# Patient Record
Sex: Female | Born: 1945 | Race: White | Hispanic: No | Smoking: Former smoker
Health system: Southern US, Community
[De-identification: ages and names within clinical notes are randomized; demographics above are authoritative.]

## PROBLEM LIST (undated history)

## (undated) DIAGNOSIS — E119 Type 2 diabetes mellitus without complications: Secondary | ICD-10-CM

## (undated) DIAGNOSIS — K219 Gastro-esophageal reflux disease without esophagitis: Secondary | ICD-10-CM

## (undated) DIAGNOSIS — I219 Acute myocardial infarction, unspecified: Secondary | ICD-10-CM

## (undated) DIAGNOSIS — I10 Essential (primary) hypertension: Secondary | ICD-10-CM

## (undated) DIAGNOSIS — E7800 Pure hypercholesterolemia, unspecified: Secondary | ICD-10-CM

## (undated) DIAGNOSIS — I2510 Atherosclerotic heart disease of native coronary artery without angina pectoris: Secondary | ICD-10-CM

## (undated) DIAGNOSIS — L570 Actinic keratosis: Secondary | ICD-10-CM

## (undated) DIAGNOSIS — C4492 Squamous cell carcinoma of skin, unspecified: Secondary | ICD-10-CM

## (undated) HISTORY — PX: ABDOMINAL HYSTERECTOMY: SHX81

## (undated) HISTORY — DX: Actinic keratosis: L57.0

## (undated) HISTORY — DX: Squamous cell carcinoma of skin, unspecified: C44.92

## (undated) HISTORY — PX: CHOLECYSTECTOMY: SHX55

## (undated) HISTORY — PX: CORONARY ANGIOPLASTY WITH STENT PLACEMENT: SHX49

---

## 2004-04-23 ENCOUNTER — Ambulatory Visit: Payer: Self-pay | Admitting: Gastroenterology

## 2006-03-29 ENCOUNTER — Ambulatory Visit: Payer: Self-pay | Admitting: Family Medicine

## 2006-05-23 ENCOUNTER — Ambulatory Visit: Payer: Self-pay

## 2006-12-30 ENCOUNTER — Other Ambulatory Visit: Payer: Self-pay

## 2006-12-30 ENCOUNTER — Emergency Department: Payer: Self-pay | Admitting: Emergency Medicine

## 2007-04-12 ENCOUNTER — Ambulatory Visit: Payer: Self-pay

## 2008-05-27 ENCOUNTER — Ambulatory Visit: Payer: Self-pay | Admitting: Unknown Physician Specialty

## 2008-06-14 ENCOUNTER — Ambulatory Visit: Payer: Self-pay | Admitting: Unknown Physician Specialty

## 2009-12-28 DIAGNOSIS — E785 Hyperlipidemia, unspecified: Secondary | ICD-10-CM | POA: Insufficient documentation

## 2012-06-02 DIAGNOSIS — I2510 Atherosclerotic heart disease of native coronary artery without angina pectoris: Secondary | ICD-10-CM | POA: Insufficient documentation

## 2014-05-22 DIAGNOSIS — R918 Other nonspecific abnormal finding of lung field: Secondary | ICD-10-CM | POA: Insufficient documentation

## 2014-05-22 DIAGNOSIS — K219 Gastro-esophageal reflux disease without esophagitis: Secondary | ICD-10-CM | POA: Insufficient documentation

## 2015-12-16 DIAGNOSIS — W19XXXA Unspecified fall, initial encounter: Secondary | ICD-10-CM | POA: Insufficient documentation

## 2016-05-31 ENCOUNTER — Encounter: Payer: Self-pay | Admitting: *Deleted

## 2016-05-31 ENCOUNTER — Emergency Department: Payer: Medicare HMO

## 2016-05-31 DIAGNOSIS — L03116 Cellulitis of left lower limb: Secondary | ICD-10-CM | POA: Insufficient documentation

## 2016-05-31 DIAGNOSIS — Y939 Activity, unspecified: Secondary | ICD-10-CM | POA: Insufficient documentation

## 2016-05-31 DIAGNOSIS — X501XXA Overexertion from prolonged static or awkward postures, initial encounter: Secondary | ICD-10-CM | POA: Insufficient documentation

## 2016-05-31 DIAGNOSIS — S8252XA Displaced fracture of medial malleolus of left tibia, initial encounter for closed fracture: Secondary | ICD-10-CM | POA: Diagnosis not present

## 2016-05-31 DIAGNOSIS — Y929 Unspecified place or not applicable: Secondary | ICD-10-CM | POA: Insufficient documentation

## 2016-05-31 DIAGNOSIS — Y999 Unspecified external cause status: Secondary | ICD-10-CM | POA: Insufficient documentation

## 2016-05-31 DIAGNOSIS — E119 Type 2 diabetes mellitus without complications: Secondary | ICD-10-CM | POA: Diagnosis not present

## 2016-05-31 DIAGNOSIS — S99912A Unspecified injury of left ankle, initial encounter: Secondary | ICD-10-CM | POA: Diagnosis present

## 2016-05-31 DIAGNOSIS — I10 Essential (primary) hypertension: Secondary | ICD-10-CM | POA: Diagnosis not present

## 2016-05-31 NOTE — ED Triage Notes (Signed)
Pt states last week she dropped a wood board on her lower left leg and then Saturday she missed a step and twisted her left leg. She has redness, swelling and bruising to the left lower extremity. Denies fevers. No meds PTA.

## 2016-06-01 ENCOUNTER — Emergency Department
Admission: EM | Admit: 2016-06-01 | Discharge: 2016-06-01 | Disposition: A | Discharge status: Home / Self Care | Payer: Medicare HMO | Attending: Emergency Medicine | Admitting: Emergency Medicine

## 2016-06-01 DIAGNOSIS — L03116 Cellulitis of left lower limb: Secondary | ICD-10-CM

## 2016-06-01 DIAGNOSIS — S82892A Other fracture of left lower leg, initial encounter for closed fracture: Secondary | ICD-10-CM

## 2016-06-01 HISTORY — DX: Gastro-esophageal reflux disease without esophagitis: K21.9

## 2016-06-01 HISTORY — DX: Type 2 diabetes mellitus without complications: E11.9

## 2016-06-01 HISTORY — DX: Pure hypercholesterolemia, unspecified: E78.00

## 2016-06-01 HISTORY — DX: Acute myocardial infarction, unspecified: I21.9

## 2016-06-01 HISTORY — DX: Essential (primary) hypertension: I10

## 2016-06-01 MED ORDER — OXYCODONE-ACETAMINOPHEN 5-325 MG PO TABS
1.0000 | ORAL_TABLET | Freq: Once | ORAL | Status: AC
  Administered 2016-06-01: 1 via ORAL

## 2016-06-01 MED ORDER — OXYCODONE-ACETAMINOPHEN 5-325 MG PO TABS
ORAL_TABLET | ORAL | Status: AC
  Administered 2016-06-01: 1 via ORAL
  Filled 2016-06-01: qty 1

## 2016-06-01 MED ORDER — CEPHALEXIN 500 MG PO CAPS
500.0000 mg | ORAL_CAPSULE | Freq: Once | ORAL | Status: AC
  Administered 2016-06-01: 500 mg via ORAL

## 2016-06-01 MED ORDER — OXYCODONE-ACETAMINOPHEN 5-325 MG PO TABS: 1.0000 | ORAL_TABLET | ORAL | 0 refills | Status: DC | PRN

## 2016-06-01 MED ORDER — CEPHALEXIN 500 MG PO CAPS: 500.0000 mg | ORAL_CAPSULE | Freq: Two times a day (BID) | ORAL | 0 refills | Status: AC

## 2016-06-01 MED ORDER — CEPHALEXIN 500 MG PO CAPS
ORAL_CAPSULE | ORAL | Status: AC
  Administered 2016-06-01: 500 mg via ORAL
  Filled 2016-06-01: qty 1

## 2016-06-01 NOTE — ED Provider Notes (Signed)
[facility] Emergency Department Provider Note   First MD Initiated Contact with Patient 06/01/16 [phone]     (approximate)  I have reviewed the triage vital signs and the nursing notes.   HISTORY  Chief Complaint Ankle Pain   HPI Kerri Mccullough is a 71 y.o. female belowof chronic medical conditions presents to the emergency department with a history of accidentally hitting the anterior portion of her lower leg 4 days ago and then subsequently twisting her left ankle shortly thereafter. Patient admits to 8 out of 10 left ankle pain and swelling. Patient also admits to redness noted to the distal portion of the left leg that is worsened  since onset tonight   Past Medical History:  Diagnosis Date  . Diabetes mellitus without complication ([place])   . GERD (gastroesophageal reflux disease)   . Heart attack ([place])   . High cholesterol   . Hypertension     There are no active problems to display for this patient.   Past Surgical History:  Procedure Laterality Date  . ABDOMINAL HYSTERECTOMY    . CORONARY ANGIOPLASTY WITH STENT PLACEMENT      Prior to Admission medications   Not on File    Allergies No known drug allergies  Family history Noncontributory Social History Social History  Substance Use Topics  . Smoking status: Never Smoker  . Smokeless tobacco: Never Used  . Alcohol use No    Review of Systems Constitutional: No fever/chills Eyes: No visual changes. ENT: No sore throat. Cardiovascular: Denies chest pain. Respiratory: Denies shortness of breath. Gastrointestinal: No abdominal pain.  No nausea, no vomiting.  No diarrhea.  No constipation. Genitourinary: Negative for dysuria. Musculoskeletal: Negative for back pain. Skin: Negative for rash. Positive for abrasion and redness to the left lower extremity. Positive for left leg swelling Neurological: Negative for headaches, focal weakness or numbness.  10-point ROS otherwise  negative.  ____________________________________________   PHYSICAL EXAM:  VITAL SIGNS: ED Triage Vitals [05/31/16 2003]  Enc Vitals Group     BP (!) 147/67     Pulse Rate 62     Resp 18     Temp 98.2 F (36.8 C)     Temp src      SpO2 96 %     Weight      Height      Head Circumference      Peak Flow      Pain Score 8     Pain Loc      Pain Edu?      Excl. in [place]?     Constitutional: Alert and oriented. Well appearing and in no acute distress. Eyes: Conjunctivae are normal. PERRL. EOMI. Head: Atraumatic. Mouth/Throat: Mucous membranes are moist.  Oropharynx non-erythematous. Neck: No stridor.   Cardiovascular: Normal rate, regular rhythm. Good peripheral circulation. Grossly normal heart sounds. Respiratory: Normal respiratory effort.  No retractions. Lungs CTAB. Gastrointestinal: Soft and nontender. No distention.  Musculoskeletal: No lower extremity tenderness nor edema. No gross deformities of extremities. Neurologic:  Normal speech and language. No gross focal neurologic deficits are appreciated.  Skin:  Skin is warm, dry and intact. No rash noted. Psychiatric: Mood and affect are normal. Speech and behavior are normal.   RADIOLOGY I, Christmas, personally viewed and evaluated these images (plain radiographs) as part of my medical decision making, as well as reviewing the written report by the radiologist.  Dg Ankle Complete Left  Result Date: 05/31/2016 CLINICAL DATA:  Dropped a  board on lower left leg, twisting injury EXAM: LEFT ANKLE COMPLETE - 3+ VIEW COMPARISON:  None. FINDINGS: Ankle mortise is grossly symmetric. Moderate diffuse soft tissue swelling. Age indeterminate medial malleolar avulsion fracture. Lateral view demonstrates questionable fracture through the distal fibula posteriorly. IMPRESSION: 1. A age indeterminate avulsion fracture injury of the medial malleolus. 2. Crescent-shaped os ossific opacity silhouetting the posterior cortex of the  distal fibula on lateral view, uncertain if this represents a fracture. Electronically Signed   By: Donavan Foil M.D.   On: 05/31/2016 21:01      Procedures   ____________________________________________   INITIAL IMPRESSION / ASSESSMENT AND PLAN / ED COURSE  Pertinent labs & imaging results that were available during my care of the patient were reviewed by me and considered in my medical decision making (see chart for details).  History physical exam consistent with avulsion fracture as well as cellulitis of the lower extremity. Spoke with the patient at length regarding warning signs that would warn immediate return to the emergency department. Patient given Keflex and Percocet.      ____________________________________________  FINAL CLINICAL IMPRESSION(S) / ED DIAGNOSES  Final diagnoses:  Avulsion fracture of ankle, left, closed, initial encounter  Cellulitis of left leg     MEDICATIONS GIVEN DURING THIS VISIT:  Medications  cephALEXin (KEFLEX) capsule 500 mg (500 mg Oral Given 06/01/16 0111)  oxyCODONE-acetaminophen (PERCOCET/ROXICET) 5-325 MG per tablet 1 tablet (1 tablet Oral Given 06/01/16 0111)     NEW OUTPATIENT MEDICATIONS STARTED DURING THIS VISIT:  New Prescriptions   No medications on file    Modified Medications   No medications on file    Discontinued Medications   No medications on file     Note:  This document was prepared using Dragon voice recognition software and may include unintentional dictation errors.    Gregor Hams, MD 06/01/16 2245

## 2016-06-02 DIAGNOSIS — S8253XA Displaced fracture of medial malleolus of unspecified tibia, initial encounter for closed fracture: Secondary | ICD-10-CM | POA: Insufficient documentation

## 2016-09-13 DIAGNOSIS — S32010S Wedge compression fracture of first lumbar vertebra, sequela: Secondary | ICD-10-CM | POA: Insufficient documentation

## 2016-09-18 DIAGNOSIS — M79671 Pain in right foot: Secondary | ICD-10-CM | POA: Insufficient documentation

## 2016-09-18 DIAGNOSIS — M8000XA Age-related osteoporosis with current pathological fracture, unspecified site, initial encounter for fracture: Secondary | ICD-10-CM | POA: Insufficient documentation

## 2017-02-10 DIAGNOSIS — IMO0001 Reserved for inherently not codable concepts without codable children: Secondary | ICD-10-CM | POA: Insufficient documentation

## 2017-05-25 DIAGNOSIS — K5900 Constipation, unspecified: Secondary | ICD-10-CM | POA: Insufficient documentation

## 2017-05-25 DIAGNOSIS — R32 Unspecified urinary incontinence: Secondary | ICD-10-CM | POA: Insufficient documentation

## 2018-05-05 ENCOUNTER — Other Ambulatory Visit: Payer: Self-pay

## 2018-05-05 ENCOUNTER — Observation Stay
Admission: EM | Admit: 2018-05-05 | Discharge: 2018-05-07 | Disposition: A | Discharge status: Home / Self Care | Payer: Medicare HMO | Attending: Internal Medicine | Admitting: Internal Medicine

## 2018-05-05 ENCOUNTER — Emergency Department: Payer: Medicare HMO

## 2018-05-05 ENCOUNTER — Encounter: Payer: Self-pay | Admitting: Emergency Medicine

## 2018-05-05 DIAGNOSIS — K449 Diaphragmatic hernia without obstruction or gangrene: Secondary | ICD-10-CM | POA: Diagnosis not present

## 2018-05-05 DIAGNOSIS — Z955 Presence of coronary angioplasty implant and graft: Secondary | ICD-10-CM | POA: Insufficient documentation

## 2018-05-05 DIAGNOSIS — M4856XA Collapsed vertebra, not elsewhere classified, lumbar region, initial encounter for fracture: Secondary | ICD-10-CM | POA: Insufficient documentation

## 2018-05-05 DIAGNOSIS — Z886 Allergy status to analgesic agent status: Secondary | ICD-10-CM | POA: Diagnosis not present

## 2018-05-05 DIAGNOSIS — I25118 Atherosclerotic heart disease of native coronary artery with other forms of angina pectoris: Secondary | ICD-10-CM

## 2018-05-05 DIAGNOSIS — R918 Other nonspecific abnormal finding of lung field: Secondary | ICD-10-CM | POA: Insufficient documentation

## 2018-05-05 DIAGNOSIS — I252 Old myocardial infarction: Secondary | ICD-10-CM | POA: Insufficient documentation

## 2018-05-05 DIAGNOSIS — Z888 Allergy status to other drugs, medicaments and biological substances status: Secondary | ICD-10-CM | POA: Diagnosis not present

## 2018-05-05 DIAGNOSIS — E119 Type 2 diabetes mellitus without complications: Secondary | ICD-10-CM | POA: Diagnosis not present

## 2018-05-05 DIAGNOSIS — K219 Gastro-esophageal reflux disease without esophagitis: Secondary | ICD-10-CM | POA: Insufficient documentation

## 2018-05-05 DIAGNOSIS — I10 Essential (primary) hypertension: Secondary | ICD-10-CM | POA: Insufficient documentation

## 2018-05-05 DIAGNOSIS — I700 Atherosclerosis of aorta: Secondary | ICD-10-CM | POA: Diagnosis not present

## 2018-05-05 DIAGNOSIS — E785 Hyperlipidemia, unspecified: Secondary | ICD-10-CM | POA: Insufficient documentation

## 2018-05-05 DIAGNOSIS — Z79899 Other long term (current) drug therapy: Secondary | ICD-10-CM | POA: Insufficient documentation

## 2018-05-05 DIAGNOSIS — Z90710 Acquired absence of both cervix and uterus: Secondary | ICD-10-CM | POA: Insufficient documentation

## 2018-05-05 DIAGNOSIS — Z7982 Long term (current) use of aspirin: Secondary | ICD-10-CM | POA: Insufficient documentation

## 2018-05-05 DIAGNOSIS — R0789 Other chest pain: Secondary | ICD-10-CM | POA: Diagnosis not present

## 2018-05-05 DIAGNOSIS — R079 Chest pain, unspecified: Secondary | ICD-10-CM | POA: Diagnosis present

## 2018-05-05 LAB — CBC
HCT: 38 % (ref 36.0–46.0)
Hemoglobin: 11.6 g/dL — ABNORMAL LOW (ref 12.0–15.0)
MCH: 26.7 pg (ref 26.0–34.0)
MCHC: 30.5 g/dL (ref 30.0–36.0)
MCV: 87.4 fL (ref 80.0–100.0)
PLATELETS: 228 10*3/uL (ref 150–400)
RBC: 4.35 MIL/uL (ref 3.87–5.11)
RDW: 14.3 % (ref 11.5–15.5)
WBC: 6.1 10*3/uL (ref 4.0–10.5)
nRBC: 0 % (ref 0.0–0.2)

## 2018-05-05 LAB — BASIC METABOLIC PANEL
Anion gap: 14 (ref 5–15)
BUN: 14 mg/dL (ref 8–23)
CALCIUM: 9.1 mg/dL (ref 8.9–10.3)
CHLORIDE: 95 mmol/L — AB (ref 98–111)
CO2: 23 mmol/L (ref 22–32)
CREATININE: 0.94 mg/dL (ref 0.44–1.00)
GFR calc non Af Amer: >60 mL/min (ref >60)
GLUCOSE: 293 mg/dL — AB (ref 70–99)
Potassium: 3.4 mmol/L — ABNORMAL LOW (ref 3.5–5.1)
Sodium: 132 mmol/L — ABNORMAL LOW (ref 135–145)

## 2018-05-05 LAB — GLUCOSE, CAPILLARY: GLUCOSE-CAPILLARY: 207 mg/dL — AB (ref 70–99)

## 2018-05-05 LAB — TROPONIN I: Troponin I: <0.03 ng/mL (ref <0.03)

## 2018-05-05 LAB — TSH: TSH: 2.394 u[IU]/mL (ref 0.350–4.500)

## 2018-05-05 MED ORDER — CITALOPRAM HYDROBROMIDE 20 MG PO TABS
20.0000 mg | ORAL_TABLET | Freq: Every day | ORAL | Status: DC
  Administered 2018-05-06 – 2018-05-07 (×2): 20 mg via ORAL
  Filled 2018-05-05 (×2): qty 1

## 2018-05-05 MED ORDER — LIRAGLUTIDE 18 MG/3ML ~~LOC~~ SOPN: 0.6000 mg | PEN_INJECTOR | Freq: Every day | SUBCUTANEOUS | Status: DC

## 2018-05-05 MED ORDER — ASPIRIN 81 MG PO CHEW
324.0000 mg | CHEWABLE_TABLET | Freq: Once | ORAL | Status: AC
  Administered 2018-05-05: 324 mg via ORAL
  Filled 2018-05-05: qty 4

## 2018-05-05 MED ORDER — ONDANSETRON HCL 4 MG PO TABS: 4.0000 mg | ORAL_TABLET | Freq: Four times a day (QID) | ORAL | Status: DC | PRN

## 2018-05-05 MED ORDER — IOHEXOL 350 MG/ML SOLN
75.0000 mL | Freq: Once | INTRAVENOUS | Status: AC | PRN
  Administered 2018-05-05: 75 mL via INTRAVENOUS

## 2018-05-05 MED ORDER — NITROGLYCERIN 0.4 MG SL SUBL
0.4000 mg | SUBLINGUAL_TABLET | Freq: Once | SUBLINGUAL | Status: AC
  Administered 2018-05-05: 0.4 mg via SUBLINGUAL
  Filled 2018-05-05: qty 1

## 2018-05-05 MED ORDER — ONDANSETRON HCL 4 MG/2ML IJ SOLN: 4.0000 mg | Freq: Four times a day (QID) | INTRAMUSCULAR | Status: DC | PRN

## 2018-05-05 MED ORDER — NITROGLYCERIN 0.4 MG SL SUBL: 0.4000 mg | SUBLINGUAL_TABLET | SUBLINGUAL | Status: DC | PRN

## 2018-05-05 MED ORDER — AMLODIPINE BESYLATE 5 MG PO TABS
5.0000 mg | ORAL_TABLET | Freq: Two times a day (BID) | ORAL | Status: DC
  Administered 2018-05-05 – 2018-05-07 (×4): 5 mg via ORAL
  Filled 2018-05-05 (×4): qty 1

## 2018-05-05 MED ORDER — ASPIRIN EC 81 MG PO TBEC
81.0000 mg | DELAYED_RELEASE_TABLET | Freq: Every day | ORAL | Status: DC
  Administered 2018-05-06: 81 mg via ORAL
  Filled 2018-05-05: qty 1

## 2018-05-05 MED ORDER — SODIUM CHLORIDE 0.9% FLUSH: 3.0000 mL | INTRAVENOUS | Status: DC | PRN

## 2018-05-05 MED ORDER — SODIUM CHLORIDE 0.9% FLUSH: 3.0000 mL | Freq: Once | INTRAVENOUS | Status: DC

## 2018-05-05 MED ORDER — PANTOPRAZOLE SODIUM 40 MG PO TBEC
40.0000 mg | DELAYED_RELEASE_TABLET | Freq: Every day | ORAL | Status: DC
  Administered 2018-05-06: 40 mg via ORAL
  Filled 2018-05-05: qty 1

## 2018-05-05 MED ORDER — MIRABEGRON ER 50 MG PO TB24
50.0000 mg | ORAL_TABLET | Freq: Every day | ORAL | Status: DC
  Administered 2018-05-06 – 2018-05-07 (×2): 50 mg via ORAL
  Filled 2018-05-05 (×2): qty 1

## 2018-05-05 MED ORDER — ACETAMINOPHEN 650 MG RE SUPP: 650.0000 mg | Freq: Four times a day (QID) | RECTAL | Status: DC | PRN

## 2018-05-05 MED ORDER — LOSARTAN POTASSIUM 25 MG PO TABS
25.0000 mg | ORAL_TABLET | Freq: Every day | ORAL | Status: DC
  Administered 2018-05-06 – 2018-05-07 (×2): 25 mg via ORAL
  Filled 2018-05-05 (×2): qty 1

## 2018-05-05 MED ORDER — SODIUM CHLORIDE 0.9% FLUSH
3.0000 mL | Freq: Two times a day (BID) | INTRAVENOUS | Status: DC
  Administered 2018-05-05 – 2018-05-06 (×3): 3 mL via INTRAVENOUS

## 2018-05-05 MED ORDER — METFORMIN HCL 500 MG PO TABS: 1000.0000 mg | ORAL_TABLET | Freq: Two times a day (BID) | ORAL | Status: DC

## 2018-05-05 MED ORDER — ACETAMINOPHEN 325 MG PO TABS: 650.0000 mg | ORAL_TABLET | Freq: Four times a day (QID) | ORAL | Status: DC | PRN

## 2018-05-05 MED ORDER — PRAVASTATIN SODIUM 40 MG PO TABS
40.0000 mg | ORAL_TABLET | Freq: Every day | ORAL | Status: DC
  Administered 2018-05-05 – 2018-05-07 (×3): 40 mg via ORAL
  Filled 2018-05-05 (×3): qty 1

## 2018-05-05 MED ORDER — GABAPENTIN 300 MG PO CAPS
300.0000 mg | ORAL_CAPSULE | Freq: Every day | ORAL | Status: DC
  Administered 2018-05-05 – 2018-05-06 (×2): 300 mg via ORAL
  Filled 2018-05-05 (×2): qty 1

## 2018-05-05 MED ORDER — MORPHINE SULFATE (PF) 2 MG/ML IV SOLN: 2.0000 mg | INTRAVENOUS | Status: DC | PRN

## 2018-05-05 MED ORDER — METOPROLOL SUCCINATE ER 25 MG PO TB24
25.0000 mg | ORAL_TABLET | Freq: Every day | ORAL | Status: DC
  Administered 2018-05-06 – 2018-05-07 (×2): 25 mg via ORAL
  Filled 2018-05-05 (×2): qty 1

## 2018-05-05 MED ORDER — ENOXAPARIN SODIUM 40 MG/0.4ML ~~LOC~~ SOLN
40.0000 mg | SUBCUTANEOUS | Status: DC
  Administered 2018-05-05 – 2018-05-06 (×2): 40 mg via SUBCUTANEOUS
  Filled 2018-05-05 (×2): qty 0.4

## 2018-05-05 MED ORDER — CHLORTHALIDONE 25 MG PO TABS
25.0000 mg | ORAL_TABLET | Freq: Every day | ORAL | Status: DC
  Administered 2018-05-06 – 2018-05-07 (×2): 25 mg via ORAL
  Filled 2018-05-05 (×2): qty 1

## 2018-05-05 MED ORDER — SODIUM CHLORIDE 0.9 % IV SOLN: 250.0000 mL | INTRAVENOUS | Status: DC | PRN

## 2018-05-05 NOTE — ED Notes (Addendum)
ED TO INPATIENT HANDOFF REPORT  ED Nurse Name and Phone #: Kerri Mccullough [phone]  S Name/Age/Gender Kerri Mccullough 73 y.o. female Room/Bed: ED09A/ED09A  Code Status   Code Status: Not on file  Home/SNF/Other Home Patient oriented to: self, place, time and situation Is this baseline? Yes   Triage Complete: Triage complete  Chief Complaint chest pain  Triage Note Pt to ED via POV. Pt states that she was out shopping and started to have central chest pain that radiated into both arms and into both sides of her throat. Pt states that the pain lasted about 5 minutes. Pt denies shortness of breath, nausea or vomiting. Pt has hx/o MI. Pt is in NAD.    Allergies Allergies  Allergen Reactions  . Ace Inhibitors Swelling  . Rosiglitazone Other (See Comments)    Other Reaction: SOB, Pulmonary Edema Other Reaction: SOB, Pulmonary Edema Other Reaction: SOB, Pulmonary Edema   . Aspirin Swelling    When taken with a beta blocker   . Golytely [Peg 3350-Electrolytes] Swelling    Level of Care/Admitting Diagnosis ED Disposition    ED Disposition Condition [place]: [place] [[id number]]  Level of Care: Telemetry [5]  Diagnosis: Chest pain [045409]  Admitting Physician: Dustin Flock [[id number]]  Attending Physician: Dustin Flock [[id number]]  PT Class (Do Not Modify): Observation [104]  PT Acc Code (Do Not Modify): Observation [10022]       B Medical/Surgery History Past Medical History:  Diagnosis Date  . Diabetes mellitus without complication ([name])   . GERD (gastroesophageal reflux disease)   . Heart attack ([place])   . High cholesterol   . Hypertension    Past Surgical History:  Procedure Laterality Date  . ABDOMINAL HYSTERECTOMY    . CORONARY ANGIOPLASTY WITH STENT PLACEMENT       A IV Location/Drains/Wounds Patient Lines/Drains/Airways Status   Active Line/Drains/Airways    Name:   Placement date:   Placement time:   Site:    Days:   Peripheral IV 05/05/18 Right Antecubital   05/05/18    1628    Antecubital   less than 1          Intake/Output Last 24 hours No intake or output data in the 24 hours ending 05/05/18 1928  Labs/Imaging Results for orders placed or performed during the hospital encounter of 05/05/18 (from the past 48 hour(s))  Basic metabolic panel     Status: Abnormal   Collection Time: 05/05/18  3:45 PM  Result Value Ref Range   Sodium 132 (L) 135 - 145 mmol/L   Potassium 3.4 (L) 3.5 - 5.1 mmol/L   Chloride 95 (L) 98 - 111 mmol/L   CO2 23 22 - 32 mmol/L   Glucose, Bld 293 (H) 70 - 99 mg/dL   BUN 14 8 - 23 mg/dL   Creatinine, Ser 0.94 0.44 - 1.00 mg/dL   Calcium 9.1 8.9 - 10.3 mg/dL   GFR calc non Af Amer >60 >60 mL/min   GFR calc Af Amer >60 >60 mL/min   Anion gap 14 5 - 15    Comment: Performed at [facility], [address] [place], [place] [address]  CBC     Status: Abnormal   Collection Time: 05/05/18  3:45 PM  Result Value Ref Range   WBC 6.1 4.0 - 10.5 K/uL   RBC 4.35 3.87 - 5.11 MIL/uL   Hemoglobin 11.6 (L) 12.0 - 15.0 g/dL   HCT 38.0  36.0 - 46.0 %   MCV 87.4 80.0 - 100.0 fL   MCH 26.7 26.0 - 34.0 pg   MCHC 30.5 30.0 - 36.0 g/dL   RDW 14.3 11.5 - 15.5 %   Platelets 228 150 - 400 K/uL   nRBC 0.0 0.0 - 0.2 %    Comment: Performed at [facility], [address] [place], [place] [address]  Troponin I - ONCE - STAT     Status: None   Collection Time: 05/05/18  3:45 PM  Result Value Ref Range   Troponin I <0.03 <0.03 ng/mL    Comment: Performed at [facility], [address], [place], [place] [address]   Ct Angio Chest Aorta W And/or Wo Contrast  Result Date: 05/05/2018 CLINICAL DATA:  Central chest pain. EXAM: CT ANGIOGRAPHY CHEST WITH CONTRAST TECHNIQUE: Multidetector CT imaging of the chest was performed using the standard protocol during bolus administration of intravenous contrast. Multiplanar CT image reconstructions and MIPs were  obtained to evaluate the vascular anatomy. CONTRAST:  42mL OMNIPAQUE IOHEXOL 350 MG/ML SOLN COMPARISON:  None. FINDINGS: Cardiovascular: Aortic atherosclerosis. No thoracic aortic aneurysm or evidence of aortic dissection. Heart size is upper normal. No pericardial effusion. Mediastinum/Nodes: No mass or enlarged lymph nodes appreciated within the mediastinum or perihilar regions. Esophagus is unremarkable. Small hiatal hernia. Thickening of the walls of the hernia is likely related to associated chronic reflux. Trachea and central bronchi are unremarkable. Lungs/Pleura: 2 adjacent 7 mm pleural based nodules are noted within the lateral aspects of the LEFT lower lobe (series 7, images 80 and 81). 8 mm nodular density within the RIGHT middle lobe (series 7, image 68). Lungs otherwise clear. No pneumonia or pulmonary edema. No pleural effusion or pneumothorax. Upper Abdomen: Limited images of the upper abdomen are unremarkable for acute process. Musculoskeletal: Chronic compression fracture deformity at the L1 vertebral body, progressed compared to previous lumbar spine CT of 08/31/2016, now over 75% compressed anteriorly/centrally with associated mild retropulsion of the posterior margin of the vertebral body. No acute appearing osseous abnormality. Review of the MIP images confirms the above findings. IMPRESSION: 1. No acute findings. No pneumonia or pulmonary edema. No aortic aneurysm or evidence of aortic dissection. 2. Small hiatal hernia. Thickening of the walls of the hernia is likely related to associated chronic reflux. 3. 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 4. Two adjacent pleural based nodules within the lateral aspects of the left lower lobe and within the right middle lobe, each measuring 8  mm. Non-contrast chest CT at 3-6 months is recommended. If the nodules are stable at time of repeat CT, then future CT at 18-24 months (from today's scan) is considered optional for low-risk patients, but is recommended for high-risk patients. This recommendation follows the consensus statement: Guidelines for Management of Incidental Pulmonary Nodules Detected on CT Images: From the Fleischner Society 2017; Radiology 2017; 284:228-243. Aortic Atherosclerosis (ICD10-I70.0). Electronically Signed   By: Franki Cabot M.D.   On: 05/05/2018 17:07    Pending Labs Unresulted Labs (From admission, onward)    Start     Ordered   05/06/18 0500  Lipid panel  Tomorrow morning,   STAT     05/05/18 1826   Signed and Held  CBC  (enoxaparin (LOVENOX)    CrCl >/= 30 ml/min)  Once,  R    Comments:  Baseline for enoxaparin therapy IF NOT ALREADY DRAWN.  Notify MD if PLT < 100 K.    Signed and Held   Signed and Held  Creatinine, serum  (enoxaparin (LOVENOX)    CrCl >/= 30 ml/min)  Once,   R    Comments:  Baseline for enoxaparin therapy IF NOT ALREADY DRAWN.    Signed and Held   Signed and Held  Creatinine, serum  (enoxaparin (LOVENOX)    CrCl >/= 30 ml/min)  Weekly,   R    Comments:  while on enoxaparin therapy    Signed and Held   Signed and Held  TSH  Once,   R     Signed and Held   Signed and Held  Hemoglobin A1c  Once,   R     Signed and Held   Signed and Held  CBC  Tomorrow morning,   R     Signed and Held   Signed and Held  Basic metabolic panel  Tomorrow morning,   R     Signed and Held          Vitals/Pain Today's Vitals   05/05/18 1540 05/05/18 1544 05/05/18 1600 05/05/18 1630  BP:  (!) 160/56 (!) 146/55 129/72  Pulse:  72 70 82  Resp:  16 20 (!) 22  Temp:  98 F (36.7 C)    TempSrc:  Oral    SpO2:  96% 97% 97%  Weight: 86.2 kg     Height: 5\' 5"  (1.651 m)     PainSc: 0-No pain       Isolation Precautions No active isolations  Medications Medications  sodium chloride flush  (NS) 0.9 % injection 3 mL ( Intravenous Canceled Entry 05/05/18 1552)  nitroGLYCERIN (NITROSTAT) SL tablet 0.4 mg (has no administration in time range)  morphine 2 MG/ML injection 2 mg (has no administration in time range)  nitroGLYCERIN (NITROSTAT) SL tablet 0.4 mg (0.4 mg Sublingual Given 05/05/18 1626)  iohexol (OMNIPAQUE) 350 MG/ML injection 75 mL (75 mLs Intravenous Contrast Given 05/05/18 1640)  aspirin chewable tablet 324 mg (324 mg Oral Given 05/05/18 1708)    Mobility walks Low fall risk   Focused Assessments    R Recommendations: See Admitting Provider Note  Report given to: Aniceto Boss, RN

## 2018-05-05 NOTE — ED Triage Notes (Signed)
Pt to ED via POV. Pt states that she was out shopping and started to have central chest pain that radiated into both arms and into both sides of her throat. Pt states that the pain lasted about 5 minutes. Pt denies shortness of breath, nausea or vomiting. Pt has hx/o MI. Pt is in NAD.

## 2018-05-05 NOTE — ED Provider Notes (Signed)
[facility] Emergency Department Provider Note   ____________________________________________   First MD Initiated Contact with Patient 05/05/18 [phone]     (approximate)  I have reviewed the triage vital signs and the nursing notes.   HISTORY  Chief Complaint Chest Pain    HPI Kerri Mccullough is a 73 y.o. female here for evaluation of chest pain  Patient reports she was walking in a store, she suddenly started to have a very heavy severe pressure in her chest that radiated to her upper back and to her neck.  It went away after she stopped to rest for about 5 minutes.  She did feel some nausea with it.  She does report she had a stress test roughly a week ago at [facility] and was told it was okay but she also reports she was not having any sort of chest pain like this this was by far the worst episode of chest pain that she reports she is ever had since her last heart attack  Take aspirin daily.  Pain is almost resolved now, just a little bit of slight discomfort still in the right side of the neck which is improving  The episode was associated with shortness of breath.  She is compliant with her medications.  Reports having 2 stents follows with [facility]   Past Medical History:  Diagnosis Date  . Diabetes mellitus without complication ([place])   . GERD (gastroesophageal reflux disease)   . Heart attack ([place])   . High cholesterol   . Hypertension     There are no active problems to display for this patient.   Past Surgical History:  Procedure Laterality Date  . ABDOMINAL HYSTERECTOMY    . CORONARY ANGIOPLASTY WITH STENT PLACEMENT      Prior to Admission medications   Medication Sig Start Date End Date Taking? Authorizing Provider  oxyCODONE-acetaminophen (ROXICET) 5-325 MG tablet Take 1 tablet by mouth every 4 (four) hours as needed for severe pain. 06/01/16   Gregor Hams, MD    Allergies Aspirin and Golytely [peg  3350-electrolytes]  No family history on file.  Social History Social History   Tobacco Use  . Smoking status: Never Smoker  . Smokeless tobacco: Never Used  Substance Use Topics  . Alcohol use: No  . Drug use: No    Review of Systems Constitutional: No fever/chills Eyes: No visual changes. ENT: No sore throat. Cardiovascular: See HPI  respiratory: See HPI Gastrointestinal: No abdominal pain.   Genitourinary: Negative for dysuria. Musculoskeletal: Negative for back pain. Skin: Negative for rash. Neurological: Negative for headaches, areas of focal weakness or numbness.    ____________________________________________   PHYSICAL EXAM:  VITAL SIGNS: ED Triage Vitals  Enc Vitals Group     BP 05/05/18 1544 (!) 160/56     Pulse Rate 05/05/18 1544 72     Resp 05/05/18 1544 16     Temp 05/05/18 1544 98 F (36.7 C)     Temp Source 05/05/18 1544 Oral     SpO2 05/05/18 1544 96 %     Weight 05/05/18 1540 190 lb (86.2 kg)     Height 05/05/18 1540 5\' 5"  (1.651 m)     Head Circumference --      Peak Flow --      Pain Score 05/05/18 1540 0     Pain Loc --      Pain Edu? --      Excl. in [place]? --  Constitutional: Alert and oriented. Well appearing and in no acute distress. Eyes: Conjunctivae are normal. Head: Atraumatic. Nose: No congestion/rhinnorhea. Mouth/Throat: Mucous membranes are moist. Neck: No stridor.  Cardiovascular: Normal rate, regular rhythm. Grossly normal heart sounds.  Good peripheral circulation. Respiratory: Normal respiratory effort.  No retractions. Lungs CTAB. Gastrointestinal: Soft and nontender. No distention. Musculoskeletal: No lower extremity tenderness nor edema. Neurologic:  Normal speech and language. No gross focal neurologic deficits are appreciated.  Skin:  Skin is warm, dry and intact. No rash noted. Psychiatric: Mood and affect are normal. Speech and behavior are normal.  ____________________________________________   LABS (all  labs ordered are listed, but only abnormal results are displayed)  Labs Reviewed  BASIC METABOLIC PANEL - Abnormal; Notable for the following components:      Result Value   Sodium 132 (*)    Potassium 3.4 (*)    Chloride 95 (*)    Glucose, Bld 293 (*)    All other components within normal limits  CBC - Abnormal; Notable for the following components:   Hemoglobin 11.6 (*)    All other components within normal limits  TROPONIN I   ____________________________________________  EKG  Reviewed interpreted by me at 1542 Heart rate 70 QRS 90 QTc 470 Normal sinus rhythm, slight nonspecific T wave flattening is seen in inferolateral leads, query some very slight depression in lead I.  There is no ST elevation.  Compared to her previous EKG from December 30, 2006 her ST elevation is now no longer present ____________________________________________  RADIOLOGY  Ct Angio Chest Aorta W And/or Wo Contrast  Result Date: 05/05/2018 CLINICAL DATA:  Central chest pain. EXAM: CT ANGIOGRAPHY CHEST WITH CONTRAST TECHNIQUE: Multidetector CT imaging of the chest was performed using the standard protocol during bolus administration of intravenous contrast. Multiplanar CT image reconstructions and MIPs were obtained to evaluate the vascular anatomy. CONTRAST:  58mL OMNIPAQUE IOHEXOL 350 MG/ML SOLN COMPARISON:  None. FINDINGS: Cardiovascular: Aortic atherosclerosis. No thoracic aortic aneurysm or evidence of aortic dissection. Heart size is upper normal. No pericardial effusion. Mediastinum/Nodes: No mass or enlarged lymph nodes appreciated within the mediastinum or perihilar regions. Esophagus is unremarkable. Small hiatal hernia. Thickening of the walls of the hernia is likely related to associated chronic reflux. Trachea and central bronchi are unremarkable. Lungs/Pleura: 2 adjacent 7 mm pleural based nodules are noted within the lateral aspects of the LEFT lower lobe (series 7, images 80 and 81). 8 mm  nodular density within the RIGHT middle lobe (series 7, image 68). Lungs otherwise clear. No pneumonia or pulmonary edema. No pleural effusion or pneumothorax. Upper Abdomen: Limited images of the upper abdomen are unremarkable for acute process. Musculoskeletal: Chronic compression fracture deformity at the L1 vertebral body, progressed compared to previous lumbar spine CT of 08/31/2016, now over 75% compressed anteriorly/centrally with associated mild retropulsion of the posterior margin of the vertebral body. No acute appearing osseous abnormality. Review of the MIP images confirms the above findings. IMPRESSION: 1. No acute findings. No pneumonia or pulmonary edema. No aortic aneurysm or evidence of aortic dissection. 2. Small hiatal hernia. Thickening of the walls of the hernia is likely related to associated chronic reflux. 3. 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 4. Two adjacent pleural based nodules within the lateral aspects of the left lower lobe and within the right middle  lobe, each measuring 8 mm. Non-contrast chest CT at 3-6 months is recommended. If the nodules are stable at time of repeat CT, then future CT at 18-24 months (from today's scan) is considered optional for low-risk patients, but is recommended for high-risk patients. This recommendation follows the consensus statement: Guidelines for Management of Incidental Pulmonary Nodules Detected on CT Images: From the Fleischner Society 2017; Radiology 2017; 284:228-243. Aortic Atherosclerosis (ICD10-I70.0). Electronically Signed   By: Franki Cabot M.D.   On: 05/05/2018 17:07    CT scan reviewed, multiple incidental findings are noted.  Please see full report.  There is no dissection ____________________________________________    PROCEDURES  Procedure(s) performed: None  Procedures  Critical Care performed: No  ____________________________________________   INITIAL IMPRESSION / ASSESSMENT AND PLAN / ED COURSE  Pertinent labs & imaging results that were available during my care of the patient were reviewed by me and considered in my medical decision making (see chart for details).   Differential diagnosis includes, but is not limited to, ACS, aortic dissection, pulmonary embolism, cardiac tamponade, pneumothorax, pneumonia, pericarditis, myocarditis, GI-related causes including esophagitis/gastritis, and musculoskeletal chest wall pain.     Clinical Course as of May 04 1799  Sat May 05, 2018  1604 Review of records. Has known CAD. Recent stress.  Procedure Note - Joaquin Bend, MD - 05/01/2018 6:17 PM EDT    Negative dobutamine stress echocardiogram - there is no electrocardiographic or echocardiographic evidence of inducible myocardial ischemia    [MQ]  1634 Patient reports takes Asa daily, just can't take it with a certain beta blocker. [facility] chart also shows on ASA daily. Pain is now resolved.   [MQ]    Clinical Course User Index [MQ] Delman Kitten, MD   ----------------------------------------- 4:40 PM on 05/05/2018 -----------------------------------------   Heart score = 7, elevated risk for ACS.  Though the patient had a recent negative stress test she reports she is not having symptoms like this and today's episode was much worse.  It is reassuring that it has resolved at this time, but I am concerned about the strong possibility for ACS or given the associated pain that radiates to her back and neck also exclude dissection.  Hemodynamically stable currently much improved  ----------------------------------------- 5:58 PM on 05/05/2018 -----------------------------------------  Decision made to admit for work-up.  Patient did have a recent stress test but her symptoms today were highly  concerning for ACS.  ____________________________________________   FINAL CLINICAL IMPRESSION(S) / ED DIAGNOSES  Final diagnoses:  Coronary artery disease with exertional angina [facility])        Note:  This document was prepared using Dragon voice recognition software and may include unintentional dictation errors       Delman Kitten, MD 05/05/18 1802

## 2018-05-05 NOTE — ED Notes (Signed)
Transport to floor room 247.AS 

## 2018-05-05 NOTE — H&P (Signed)
[place] [facility] [place] NAME: Kerri Mccullough    MR#:  [id number]  DATE OF BIRTH:  Jun 12, 1945  DATE OF ADMISSION:  05/05/2018  PRIMARY CARE PHYSICIAN: System, Pcp Not In   REQUESTING/REFERRING PHYSICIAN: Delman Kitten, MD  CHIEF COMPLAINT:   Chief Complaint  Patient presents with  . Chest Pain    HISTORY OF PRESENT ILLNESS: Kerri Mccullough  is a 73 y.o. female with a known history of diabetes type 2, GERD, coronary artery disease previous 2 stents, high cholesterol and hypertension presenting to the emergency room with complaint of chest pain.  Patient states that she was at a [place] store when all of a sudden she started having chest pain.  She describes the pain as a sharp pain.  Also symptoms went up to her throat.  She did not get short of breath.  She states that she had to sit down.  She is followed at [facility] and had a stress test 1 week ago which supposedly she was told was mostly normal.      PAST MEDICAL HISTORY:   Past Medical History:  Diagnosis Date  . Diabetes mellitus without complication ([place])   . GERD (gastroesophageal reflux disease)   . Heart attack ([place])   . High cholesterol   . Hypertension     PAST SURGICAL HISTORY:  Past Surgical History:  Procedure Laterality Date  . ABDOMINAL HYSTERECTOMY    . CORONARY ANGIOPLASTY WITH STENT PLACEMENT      SOCIAL HISTORY:  Social History   Tobacco Use  . Smoking status: Never Smoker  . Smokeless tobacco: Never Used  Substance Use Topics  . Alcohol use: No    FAMILY HISTORY: No family history on file.  DRUG ALLERGIES:  Allergies  Allergen Reactions  . Ace Inhibitors Swelling  . Rosiglitazone Other (See Comments)    Other Reaction: SOB, Pulmonary Edema Other Reaction: SOB, Pulmonary Edema Other Reaction: SOB, Pulmonary Edema   . Aspirin Swelling    When taken with a beta blocker   . Golytely [Peg 3350-Electrolytes] Swelling    REVIEW OF SYSTEMS:    CONSTITUTIONAL: No fever, fatigue or weakness.  EYES: No blurred or double vision.  EARS, NOSE, AND THROAT: No tinnitus or ear pain.  RESPIRATORY: No cough, shortness of breath, wheezing or hemoptysis.  CARDIOVASCULAR: Positive earlier chest pain, orthopnea, edema.  GASTROINTESTINAL: No nausea, vomiting, diarrhea or abdominal pain.  GENITOURINARY: No dysuria, hematuria.  ENDOCRINE: No polyuria, nocturia,  HEMATOLOGY: No anemia, easy bruising or bleeding SKIN: No rash or lesion. MUSCULOSKELETAL: No joint pain or arthritis.   NEUROLOGIC: No tingling, numbness, weakness.  PSYCHIATRY: No anxiety or depression.   MEDICATIONS AT HOME:  Prior to Admission medications   Medication Sig Start Date End Date Taking? Authorizing Provider  amLODipine (NORVASC) 5 MG tablet Take 5 mg by mouth 2 (two) times daily. 04/05/18  Yes [provider]  aspirin EC 81 MG tablet Take 81 mg by mouth daily. 11/09/11  Yes [provider]  chlorthalidone (HYGROTON) 25 MG tablet Take 25 mg by mouth daily. 04/05/18  Yes [provider]  citalopram (CELEXA) 20 MG tablet Take 20 mg by mouth daily. 04/05/18  Yes [provider]  gabapentin (NEURONTIN) 100 MG capsule Take 300 mg by mouth Nightly. 04/05/18  Yes [provider]  losartan (COZAAR) 25 MG tablet Take 25 mg by mouth daily. 04/05/18  Yes [provider]  metFORMIN (GLUCOPHAGE) 1000 MG tablet Take 1,000 mg  by mouth 2 (two) times daily. 04/05/18  Yes [provider]  metoprolol succinate (TOPROL-XL) 25 MG 24 hr tablet Take 25 mg by mouth daily.   Yes [provider]  mirabegron ER (MYRBETRIQ) 50 MG TB24 tablet Take 50 mg by mouth daily. 04/23/18  Yes [provider]  omeprazole (PRILOSEC) 40 MG capsule Take 40 mg by mouth daily. 04/05/18  Yes [provider]  pravastatin (PRAVACHOL) 40 MG tablet Take 40 mg by mouth daily. 04/05/18  Yes [provider]  VICTOZA 18 MG/3ML SOPN  Inject 0.6 mg into the skin daily. 04/19/18  Yes [provider]      PHYSICAL EXAMINATION:   VITAL SIGNS: Blood pressure 129/72, pulse 82, temperature 98 F (36.7 C), temperature source Oral, resp. rate (!) 22, height 5\' 5"  (1.651 m), weight 86.2 kg, SpO2 97 %.  GENERAL:  73 y.o.-year-old patient lying in the bed with no acute distress.  EYES: Pupils equal, round, reactive to light and accommodation. No scleral icterus. Extraocular muscles intact.  HEENT: Head atraumatic, normocephalic. Oropharynx and nasopharynx clear.  NECK:  Supple, no jugular venous distention. No thyroid enlargement, no tenderness.  LUNGS: Normal breath sounds bilaterally, no wheezing, rales,rhonchi or crepitation. No use of accessory muscles of respiration.  CARDIOVASCULAR: S1, S2 normal. No murmurs, rubs, or gallops.  ABDOMEN: Soft, nontender, nondistended. Bowel sounds present. No organomegaly or mass.  EXTREMITIES: No pedal edema, cyanosis, or clubbing.  NEUROLOGIC: Cranial nerves II through XII are intact. Muscle strength 5/5 in all extremities. Sensation intact. Gait not checked.  PSYCHIATRIC: The patient is alert and oriented x 3.  SKIN: No obvious rash, lesion, or ulcer.   LABORATORY PANEL:   CBC Recent Labs  Lab 05/05/18 1545  WBC 6.1  HGB 11.6*  HCT 38.0  PLT 228  MCV 87.4  MCH 26.7  MCHC 30.5  RDW 14.3   ------------------------------------------------------------------------------------------------------------------  Chemistries  Recent Labs  Lab 05/05/18 1545  NA 132*  K 3.4*  CL 95*  CO2 23  GLUCOSE 293*  BUN 14  CREATININE 0.94  CALCIUM 9.1   ------------------------------------------------------------------------------------------------------------------ estimated creatinine clearance is 59.5 mL/min (by C-G formula based on SCr of 0.94 mg/dL). ------------------------------------------------------------------------------------------------------------------ No results  for input(s): TSH, T4TOTAL, T3FREE, THYROIDAB in the last 72 hours.  Invalid input(s): FREET3   Coagulation profile No results for input(s): INR, PROTIME in the last 168 hours. ------------------------------------------------------------------------------------------------------------------- No results for input(s): DDIMER in the last 72 hours. -------------------------------------------------------------------------------------------------------------------  Cardiac Enzymes Recent Labs  Lab 05/05/18 1545  TROPONINI <0.03   ------------------------------------------------------------------------------------------------------------------ Invalid input(s): POCBNP  ---------------------------------------------------------------------------------------------------------------  Urinalysis No results found for: COLORURINE, APPEARANCEUR, LABSPEC, PHURINE, GLUCOSEU, HGBUR, BILIRUBINUR, KETONESUR, PROTEINUR, UROBILINOGEN, NITRITE, LEUKOCYTESUR   RADIOLOGY: Ct Angio Chest Aorta W And/or Wo Contrast  Result Date: 05/05/2018 CLINICAL DATA:  Central chest pain. EXAM: CT ANGIOGRAPHY CHEST WITH CONTRAST TECHNIQUE: Multidetector CT imaging of the chest was performed using the standard protocol during bolus administration of intravenous contrast. Multiplanar CT image reconstructions and MIPs were obtained to evaluate the vascular anatomy. CONTRAST:  51mL OMNIPAQUE IOHEXOL 350 MG/ML SOLN COMPARISON:  None. FINDINGS: Cardiovascular: Aortic atherosclerosis. No thoracic aortic aneurysm or evidence of aortic dissection. Heart size is upper normal. No pericardial effusion. Mediastinum/Nodes: No mass or enlarged lymph nodes appreciated within the mediastinum or perihilar regions. Esophagus is unremarkable. Small hiatal hernia. Thickening of the walls of the hernia is likely related to associated chronic reflux. Trachea and central bronchi are unremarkable. Lungs/Pleura: 2 adjacent 7 mm pleural based nodules are  noted within the lateral aspects of the LEFT lower lobe (series 7, images 80 and 81). 8 mm nodular density within the RIGHT middle lobe (series 7, image 68). Lungs otherwise clear. No pneumonia or pulmonary edema. No pleural effusion or pneumothorax. Upper Abdomen: Limited images of the upper abdomen are unremarkable for acute process. Musculoskeletal: Chronic compression fracture deformity at the L1 vertebral body, progressed compared to previous lumbar spine CT of 08/31/2016, now over 75% compressed anteriorly/centrally with associated mild retropulsion of the posterior margin of the vertebral body. No acute appearing osseous abnormality. Review of the MIP images confirms the above findings. IMPRESSION: 1. No acute findings. No pneumonia or pulmonary edema. No aortic aneurysm or evidence of aortic dissection. 2. Small hiatal hernia. Thickening of the walls of the hernia is likely related to associated chronic reflux. 3. 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 4. Two adjacent pleural based nodules within the lateral aspects of the left lower lobe and within the right middle lobe, each measuring 8 mm. Non-contrast chest CT at 3-6 months is recommended. If the nodules are stable at time of repeat CT, then future CT at 18-24 months (from today's scan) is considered optional for low-risk patients, but is recommended for high-risk patients. This recommendation follows the consensus statement: Guidelines for Management of Incidental Pulmonary Nodules Detected on CT Images: From the Fleischner Society 2017; Radiology 2017; 284:228-243. Aortic Atherosclerosis (ICD10-I70.0). Electronically Signed   By: Franki Cabot M.D.   On: 05/05/2018 17:07    EKG: Orders placed or performed during the hospital encounter of  05/05/18  . EKG 12-Lead  . EKG 12-Lead  . ED EKG  . ED EKG  . ED EKG  . ED EKG  . EKG 12-Lead  . EKG 12-Lead    IMPRESSION AND PLAN: Patient is a 73 year old with history of coronary artery disease presenting with chest pain  1.  Chest pain atypical in nature will admit for observation overnight Cardiology has been consulted Follow troponin levels  2.  Hypertension continue Norvasc and Cozaar  3.  Diabetes type 2 Continue Metformin Place on sliding scale insulin as well as continue Victoza  4.  Hyperlipidemia continue Pravachol fasting lipid panel in the morning  5.  Miscellaneous Lovenox for DVT prophylaxis   All the records are reviewed and case discussed with ED provider. Management plans discussed with the patient, family and they are in agreement.  CODE STATUS: Advance Directive Documentation     Most Recent Value  Type of Advance Directive  Healthcare Power of Attorney, Living will  Pre-existing out of facility DNR order (yellow form or pink MOST form)  -  "MOST" Form in Place?  -       TOTAL TIME TAKING CARE OF THIS PATIENT: 71minutes.    Dustin Flock M.D on 05/05/2018 at 6:11 PM  Between 7am to 6pm - Pager - [phone]  After 6pm go to [email or web address] - password [email or web address] [facility]  [employer] Office  [phone]  CC: Primary care physician; System, Pcp Not In

## 2018-05-06 LAB — CBC
HCT: 34.8 % — ABNORMAL LOW (ref 36.0–46.0)
Hemoglobin: 10.7 g/dL — ABNORMAL LOW (ref 12.0–15.0)
MCH: 27 pg (ref 26.0–34.0)
MCHC: 30.7 g/dL (ref 30.0–36.0)
MCV: 87.7 fL (ref 80.0–100.0)
Platelets: 174 10*3/uL (ref 150–400)
RBC: 3.97 MIL/uL (ref 3.87–5.11)
RDW: 14.2 % (ref 11.5–15.5)
WBC: 4.8 10*3/uL (ref 4.0–10.5)
nRBC: 0 % (ref 0.0–0.2)

## 2018-05-06 LAB — BASIC METABOLIC PANEL
Anion gap: 10 (ref 5–15)
BUN: 12 mg/dL (ref 8–23)
CALCIUM: 8.9 mg/dL (ref 8.9–10.3)
CO2: 27 mmol/L (ref 22–32)
Chloride: 98 mmol/L (ref 98–111)
Creatinine, Ser: 0.76 mg/dL (ref 0.44–1.00)
GFR calc non Af Amer: >60 mL/min (ref >60)
Glucose, Bld: 245 mg/dL — ABNORMAL HIGH (ref 70–99)
Potassium: 2.8 mmol/L — ABNORMAL LOW (ref 3.5–5.1)
Sodium: 135 mmol/L (ref 135–145)

## 2018-05-06 LAB — GLUCOSE, CAPILLARY
Glucose-Capillary: 237 mg/dL — ABNORMAL HIGH (ref 70–99)
Glucose-Capillary: 263 mg/dL — ABNORMAL HIGH (ref 70–99)
Glucose-Capillary: 238 mg/dL — ABNORMAL HIGH (ref 70–99)
Glucose-Capillary: 295 mg/dL — ABNORMAL HIGH (ref 70–99)

## 2018-05-06 LAB — TROPONIN I
Troponin I: <0.03 ng/mL (ref <0.03)
Troponin I: <0.03 ng/mL (ref <0.03)

## 2018-05-06 LAB — POTASSIUM: Potassium: 3.7 mmol/L (ref 3.5–5.1)

## 2018-05-06 LAB — HEMOGLOBIN A1C
Hgb A1c MFr Bld: 11.5 % — ABNORMAL HIGH (ref 4.8–5.6)
Mean Plasma Glucose: 283.35 mg/dL

## 2018-05-06 LAB — LIPID PANEL
Cholesterol: 165 mg/dL (ref 0–200)
HDL: 36 mg/dL — ABNORMAL LOW (ref >40)
LDL CALC: 83 mg/dL (ref 0–99)
Total CHOL/HDL Ratio: 4.6 RATIO
Triglycerides: 229 mg/dL — ABNORMAL HIGH (ref <150)
VLDL: 46 mg/dL — ABNORMAL HIGH (ref 0–40)

## 2018-05-06 LAB — MAGNESIUM: Magnesium: 1.9 mg/dL (ref 1.7–2.4)

## 2018-05-06 MED ORDER — ROPINIROLE HCL 0.25 MG PO TABS
0.2500 mg | ORAL_TABLET | Freq: Every day | ORAL | Status: DC
  Administered 2018-05-06 (×2): 0.25 mg via ORAL
  Filled 2018-05-06 (×3): qty 1

## 2018-05-06 MED ORDER — INSULIN ASPART 100 UNIT/ML ~~LOC~~ SOLN
0.0000 [IU] | Freq: Three times a day (TID) | SUBCUTANEOUS | Status: DC
  Filled 2018-05-06: qty 1

## 2018-05-06 MED ORDER — SODIUM CHLORIDE 0.9 % IV SOLN
INTRAVENOUS | Status: DC
  Administered 2018-05-06 – 2018-05-07 (×2): via INTRAVENOUS

## 2018-05-06 MED ORDER — POTASSIUM CHLORIDE CRYS ER 20 MEQ PO TBCR
40.0000 meq | EXTENDED_RELEASE_TABLET | ORAL | Status: AC
  Administered 2018-05-06 (×2): 40 meq via ORAL
  Filled 2018-05-06 (×2): qty 2

## 2018-05-06 NOTE — Consult Note (Signed)
Reason for Consult: Unstable angina coronary disease Referring Physician: Dr. Ulysees Barns hospitalist [facility] primary  Kerri Mccullough is an 73 y.o. female.  HPI: Patient is a 73 year old female known coronary disease previous myocardial infarction diabetes GERD hypertension hyperlipidemia prior to previous stents reportedly was out shopping with her family at [facility] and started to suddenly have chest pain suggestive of unstable angina sharp pain lasted several minutes did not get short of breath stated she had to sit down patient reportedly recently had a stress test done at [facility] which was reportedly equivocal.  Patient now had worsening symptoms to come to the emergency room for further evaluation when she asked to come because of significant symptoms  Past Medical History:  Diagnosis Date  . Diabetes mellitus without complication ([place])   . GERD (gastroesophageal reflux disease)   . Heart attack ([place])   . High cholesterol   . Hypertension     Past Surgical History:  Procedure Laterality Date  . ABDOMINAL HYSTERECTOMY    . CORONARY ANGIOPLASTY WITH STENT PLACEMENT      No family history on file.  Social History:  reports that she has never smoked. She has never used smokeless tobacco. She reports that she does not drink alcohol or use drugs.  Allergies:  Allergies  Allergen Reactions  . Ace Inhibitors Swelling  . Rosiglitazone Other (See Comments)    Other Reaction: SOB, Pulmonary Edema Other Reaction: SOB, Pulmonary Edema Other Reaction: SOB, Pulmonary Edema   . Aspirin Swelling    When taken with a beta blocker   . Golytely [Peg 3350-Electrolytes] Swelling    Medications: I have reviewed the patient's current medications.  Results for orders placed or performed during the hospital encounter of 05/05/18 (from the past 48 hour(s))  Basic metabolic panel     Status: Abnormal   Collection Time: 05/05/18  3:45 PM  Result Value Ref Range    Sodium 132 (L) 135 - 145 mmol/L   Potassium 3.4 (L) 3.5 - 5.1 mmol/L   Chloride 95 (L) 98 - 111 mmol/L   CO2 23 22 - 32 mmol/L   Glucose, Bld 293 (H) 70 - 99 mg/dL   BUN 14 8 - 23 mg/dL   Creatinine, Ser 0.94 0.44 - 1.00 mg/dL   Calcium 9.1 8.9 - 10.3 mg/dL   GFR calc non Af Amer >60 >60 mL/min   GFR calc Af Amer >60 >60 mL/min   Anion gap 14 5 - 15    Comment: Performed at [facility], [address] [place], [place] [address]  CBC     Status: Abnormal   Collection Time: 05/05/18  3:45 PM  Result Value Ref Range   WBC 6.1 4.0 - 10.5 K/uL   RBC 4.35 3.87 - 5.11 MIL/uL   Hemoglobin 11.6 (L) 12.0 - 15.0 g/dL   HCT 38.0 36.0 - 46.0 %   MCV 87.4 80.0 - 100.0 fL   MCH 26.7 26.0 - 34.0 pg   MCHC 30.5 30.0 - 36.0 g/dL   RDW 14.3 11.5 - 15.5 %   Platelets 228 150 - 400 K/uL   nRBC 0.0 0.0 - 0.2 %    Comment: Performed at [facility], [address] [place], [place] [address]  Troponin I - ONCE - STAT     Status: None   Collection Time: 05/05/18  3:45 PM  Result Value Ref Range   Troponin I <0.03 <0.03 ng/mL    Comment: Performed  at [facility], [address] [place], [place] [address]  TSH     Status: None   Collection Time: 05/05/18  3:45 PM  Result Value Ref Range   TSH 2.394 0.350 - 4.500 uIU/mL    Comment: Performed by a 3rd Generation assay with a functional sensitivity of <=0.01 uIU/mL. Performed at [facility], [address] [place], [place] [address]   Hemoglobin A1c     Status: Abnormal   Collection Time: 05/05/18  3:45 PM  Result Value Ref Range   Hgb A1c MFr Bld 11.5 (H) 4.8 - 5.6 %    Comment: (NOTE) Pre diabetes:          5.7%-6.4% Diabetes:              >6.4% Glycemic control for   <7.0% adults with diabetes    Mean Plasma Glucose 283.35 mg/dL    Comment: Performed at [facility] [address] [place], [place] [address]  Glucose, capillary     Status: Abnormal   Collection Time: 05/05/18  8:04 PM   Result Value Ref Range   Glucose-Capillary 207 (H) 70 - 99 mg/dL  Lipid panel     Status: Abnormal   Collection Time: 05/06/18  4:54 AM  Result Value Ref Range   Cholesterol 165 0 - 200 mg/dL   Triglycerides 229 (H) <150 mg/dL   HDL 36 (L) >40 mg/dL   Total CHOL/HDL Ratio 4.6 RATIO   VLDL 46 (H) 0 - 40 mg/dL   LDL Cholesterol 83 0 - 99 mg/dL    Comment:        Total Cholesterol/HDL:CHD Risk Coronary Heart Disease Risk Table                     Men   Women  1/2 Average Risk   3.4   3.3  Average Risk       5.0   4.4  2 X Average Risk   9.6   7.1  3 X Average Risk  23.4   11.0        Use the calculated Patient Ratio above and the CHD Risk Table to determine the patient's CHD Risk.        ATP III CLASSIFICATION (LDL):  <100     mg/dL   Optimal  100-129  mg/dL   Near or Above                    Optimal  130-159  mg/dL   Borderline  160-189  mg/dL   High  >190     mg/dL   Very High Performed at [facility], [address] [place], [place] [address]   CBC     Status: Abnormal   Collection Time: 05/06/18  4:54 AM  Result Value Ref Range   WBC 4.8 4.0 - 10.5 K/uL   RBC 3.97 3.87 - 5.11 MIL/uL   Hemoglobin 10.7 (L) 12.0 - 15.0 g/dL   HCT 34.8 (L) 36.0 - 46.0 %   MCV 87.7 80.0 - 100.0 fL   MCH 27.0 26.0 - 34.0 pg   MCHC 30.7 30.0 - 36.0 g/dL   RDW 14.2 11.5 - 15.5 %   Platelets 174 150 - 400 K/uL   nRBC 0.0 0.0 - 0.2 %    Comment: Performed at [facility], [address], [place], [place] [address]  Basic metabolic panel     Status: Abnormal  Collection Time: 05/06/18  4:54 AM  Result Value Ref Range   Sodium 135 135 - 145 mmol/L   Potassium 2.8 (L) 3.5 - 5.1 mmol/L   Chloride 98 98 - 111 mmol/L   CO2 27 22 - 32 mmol/L   Glucose, Bld 245 (H) 70 - 99 mg/dL   BUN 12 8 - 23 mg/dL   Creatinine, Ser 0.76 0.44 - 1.00 mg/dL   Calcium 8.9 8.9 - 10.3 mg/dL   GFR calc non Af Amer >60 >60 mL/min   GFR calc Af Amer >60 >60 mL/min   Anion gap 10 5 - 15     Comment: Performed at [facility], [address] [place], [place] [address]  Troponin I - Once     Status: None   Collection Time: 05/06/18  4:54 AM  Result Value Ref Range   Troponin I <0.03 <0.03 ng/mL    Comment: Performed at [facility], [address] [place], [place] [address]  Glucose, capillary     Status: Abnormal   Collection Time: 05/06/18  8:02 AM  Result Value Ref Range   Glucose-Capillary 237 (H) 70 - 99 mg/dL  Troponin I - Once     Status: None   Collection Time: 05/06/18 11:39 AM  Result Value Ref Range   Troponin I <0.03 <0.03 ng/mL    Comment: Performed at [facility], [address] [place], [place] [address]  Magnesium     Status: None   Collection Time: 05/06/18 11:39 AM  Result Value Ref Range   Magnesium 1.9 1.7 - 2.4 mg/dL    Comment: Performed at [facility], [place], [place] [address]  Glucose, capillary     Status: Abnormal   Collection Time: 05/06/18 11:42 AM  Result Value Ref Range   Glucose-Capillary 263 (H) 70 - 99 mg/dL  Potassium     Status: None   Collection Time: 05/06/18  1:36 PM  Result Value Ref Range   Potassium 3.7 3.5 - 5.1 mmol/L    Comment: Performed at [facility], [address] [place], [place] [address]  Glucose, capillary     Status: Abnormal   Collection Time: 05/06/18  4:59 PM  Result Value Ref Range   Glucose-Capillary 238 (H) 70 - 99 mg/dL   Comment 1 Notify RN    Comment 2 Document in Chart   Glucose, capillary     Status: Abnormal   Collection Time: 05/06/18  8:56 PM  Result Value Ref Range   Glucose-Capillary 295 (H) 70 - 99 mg/dL   Comment 1 Notify RN    Comment 2 Document in Chart     Ct Angio Chest Aorta W And/or Wo Contrast  Result Date: 05/05/2018 CLINICAL DATA:  Central chest pain. EXAM: CT ANGIOGRAPHY CHEST WITH CONTRAST TECHNIQUE: Multidetector CT imaging of the chest was performed using the standard protocol during bolus  administration of intravenous contrast. Multiplanar CT image reconstructions and MIPs were obtained to evaluate the vascular anatomy. CONTRAST:  38mL OMNIPAQUE IOHEXOL 350 MG/ML SOLN COMPARISON:  None. FINDINGS: Cardiovascular: Aortic atherosclerosis. No thoracic aortic aneurysm or evidence of aortic dissection. Heart size is upper normal. No pericardial effusion. Mediastinum/Nodes: No mass or enlarged lymph nodes appreciated within the mediastinum or perihilar regions. Esophagus is unremarkable. Small hiatal hernia. Thickening of the walls of the hernia is likely related to associated chronic reflux. Trachea and central bronchi are unremarkable. Lungs/Pleura: 2 adjacent 7 mm pleural based nodules are noted  within the lateral aspects of the LEFT lower lobe (series 7, images 80 and 81). 8 mm nodular density within the RIGHT middle lobe (series 7, image 68). Lungs otherwise clear. No pneumonia or pulmonary edema. No pleural effusion or pneumothorax. Upper Abdomen: Limited images of the upper abdomen are unremarkable for acute process. Musculoskeletal: Chronic compression fracture deformity at the L1 vertebral body, progressed compared to previous lumbar spine CT of 08/31/2016, now over 75% compressed anteriorly/centrally with associated mild retropulsion of the posterior margin of the vertebral body. No acute appearing osseous abnormality. Review of the MIP images confirms the above findings. IMPRESSION: 1. No acute findings. No pneumonia or pulmonary edema. No aortic aneurysm or evidence of aortic dissection. 2. Small hiatal hernia. Thickening of the walls of the hernia is likely related to associated chronic reflux. 3. 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 4. Two adjacent pleural based nodules within the  lateral aspects of the left lower lobe and within the right middle lobe, each measuring 8 mm. Non-contrast chest CT at 3-6 months is recommended. If the nodules are stable at time of repeat CT, then future CT at 18-24 months (from today's scan) is considered optional for low-risk patients, but is recommended for high-risk patients. This recommendation follows the consensus statement: Guidelines for Management of Incidental Pulmonary Nodules Detected on CT Images: From the Fleischner Society 2017; Radiology 2017; 284:228-243. Aortic Atherosclerosis (ICD10-I70.0). Electronically Signed   By: Franki Cabot M.D.   On: 05/05/2018 17:07    Review of Systems  Constitutional: Positive for malaise/fatigue.  HENT: Negative.   Eyes: Negative.   Respiratory: Positive for shortness of breath.   Cardiovascular: Positive for chest pain.  Gastrointestinal: Positive for heartburn.  Genitourinary: Negative.   Musculoskeletal: Negative.   Skin: Negative.   Neurological: Negative.   Endo/Heme/Allergies: Negative.   Psychiatric/Behavioral: Negative.    Blood pressure (!) 149/69, pulse 76, temperature 98.4 F (36.9 C), temperature source Oral, resp. rate 20, height 5\' 5"  (1.651 m), weight 86.2 kg, SpO2 99 %. Physical Exam  Nursing note and vitals reviewed. Constitutional: She is oriented to person, place, and time. She appears well-developed and well-nourished.  HENT:  Head: Normocephalic and atraumatic.  Eyes: Pupils are equal, round, and reactive to light. Conjunctivae and EOM are normal.  Neck: Normal range of motion. Neck supple.  Cardiovascular: Normal rate, regular rhythm and normal heart sounds.  Respiratory: Effort normal and breath sounds normal.  GI: Soft. Bowel sounds are normal.  Musculoskeletal: Normal range of motion.  Neurological: She is alert and oriented to person, place, and time. She has normal reflexes.  Skin: Skin is warm and dry.  Psychiatric: She has a normal mood and affect.     Assessment/Plan: Unstable angina Known coronary disease Diabetes GERD Hyperlipidemia Hypertension . Plan  agree with admit rule out myocardial infarction Follow-up EKGs cardiac enzymes Recommend aspirin therapy for anticoagulation and arteriosclerotic vascular disease Continue hypertension control with amlodipine and chlorthalidone losartan metoprolol Diabetes management should be maintained on Victoza Glucophage Hyperlipidemia treatment with Pravachol should be continued Mild anxiety depression continue Celexa Will consider cardiac cath as an inpatient versus outpatient  Dwayne D Callwood 05/06/2018, 10:29 PM

## 2018-05-06 NOTE — Progress Notes (Signed)
[place] [facility] [place] NAME: Kerri Mccullough    MR#:  [id number]  DATE OF BIRTH:  18-May-1945  SUBJECTIVE:  CHIEF COMPLAINT:   Chief Complaint  Patient presents with  . Chest Pain   No new complaint this morning.  Currently chest pain-free.  No fevers.  Daughter at bedside concerned about patient's symptoms especially due to her prior history of stent placement in the past.  Already seen by cardiologist with plans for cardiac catheterization tomorrow.  REVIEW OF SYSTEMS:  Review of Systems  Constitutional: Negative for chills and fever.  HENT: Negative for hearing loss and tinnitus.   Eyes: Negative for blurred vision and double vision.  Respiratory: Negative for cough and sputum production.   Cardiovascular: Negative for palpitations.       Chest pain resolved  Gastrointestinal: Negative for heartburn, nausea and vomiting.  Genitourinary: Negative for dysuria and urgency.  Musculoskeletal: Negative for myalgias and neck pain.  Skin: Negative for itching and rash.  Neurological: Negative for dizziness and headaches.  Psychiatric/Behavioral: Negative for depression and substance abuse.    DRUG ALLERGIES:   Allergies  Allergen Reactions  . Ace Inhibitors Swelling  . Rosiglitazone Other (See Comments)    Other Reaction: SOB, Pulmonary Edema Other Reaction: SOB, Pulmonary Edema Other Reaction: SOB, Pulmonary Edema   . Aspirin Swelling    When taken with a beta blocker   . Golytely [Peg 3350-Electrolytes] Swelling   VITALS:  Blood pressure (!) 123/52, pulse 62, temperature 98 F (36.7 C), temperature source Oral, resp. rate 18, height 5\' 5"  (1.651 m), weight 86.2 kg, SpO2 95 %. PHYSICAL EXAMINATION:   Physical Exam  Constitutional: She is oriented to person, place, and time. She appears well-developed and well-nourished.  HENT:  Head: Normocephalic and atraumatic.  Right Ear: External ear normal.  Eyes: Pupils are equal,  round, and reactive to light. Conjunctivae and EOM are normal. Right eye exhibits no discharge.  Neck: Normal range of motion. Neck supple. No thyromegaly present.  Cardiovascular: Normal rate, regular rhythm and normal heart sounds.  Respiratory: Effort normal and breath sounds normal. She has no wheezes.  GI: Soft. Bowel sounds are normal. She exhibits no distension.  Musculoskeletal: Normal range of motion.        General: No edema.  Neurological: She is alert and oriented to person, place, and time. No cranial nerve deficit.  Skin: Skin is warm. She is not diaphoretic. No erythema.  Psychiatric: She has a normal mood and affect. Her behavior is normal.   LABORATORY PANEL:  Female CBC Recent Labs  Lab 05/06/18 0454  WBC 4.8  HGB 10.7*  HCT 34.8*  PLT 174   ------------------------------------------------------------------------------------------------------------------ Chemistries  Recent Labs  Lab 05/06/18 0454 05/06/18 1139  NA 135  --   K 2.8*  --   CL 98  --   CO2 27  --   GLUCOSE 245*  --   BUN 12  --   CREATININE 0.76  --   CALCIUM 8.9  --   MG  --  1.9   RADIOLOGY:  Ct Angio Chest Aorta W And/or Wo Contrast  Result Date: 05/05/2018 CLINICAL DATA:  Central chest pain. EXAM: CT ANGIOGRAPHY CHEST WITH CONTRAST TECHNIQUE: Multidetector CT imaging of the chest was performed using the standard protocol during bolus administration of intravenous contrast. Multiplanar CT image reconstructions and MIPs were obtained to evaluate the vascular anatomy. CONTRAST:  64mL OMNIPAQUE IOHEXOL 350 MG/ML SOLN COMPARISON:  None. FINDINGS: Cardiovascular: Aortic atherosclerosis. No thoracic aortic aneurysm or evidence of aortic dissection. Heart size is upper normal. No pericardial effusion. Mediastinum/Nodes: No mass or enlarged lymph nodes appreciated within the mediastinum or perihilar regions. Esophagus is unremarkable. Small hiatal hernia. Thickening of the walls of the hernia is  likely related to associated chronic reflux. Trachea and central bronchi are unremarkable. Lungs/Pleura: 2 adjacent 7 mm pleural based nodules are noted within the lateral aspects of the LEFT lower lobe (series 7, images 80 and 81). 8 mm nodular density within the RIGHT middle lobe (series 7, image 68). Lungs otherwise clear. No pneumonia or pulmonary edema. No pleural effusion or pneumothorax. Upper Abdomen: Limited images of the upper abdomen are unremarkable for acute process. Musculoskeletal: Chronic compression fracture deformity at the L1 vertebral body, progressed compared to previous lumbar spine CT of 08/31/2016, now over 75% compressed anteriorly/centrally with associated mild retropulsion of the posterior margin of the vertebral body. No acute appearing osseous abnormality. Review of the MIP images confirms the above findings. IMPRESSION: 1. No acute findings. No pneumonia or pulmonary edema. No aortic aneurysm or evidence of aortic dissection. 2. Small hiatal hernia. Thickening of the walls of the hernia is likely related to associated chronic reflux. 3. 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 4. Two adjacent pleural based nodules within the lateral aspects of the left lower lobe and within the right middle lobe, each measuring 8 mm. Non-contrast chest CT at 3-6 months is recommended. If the nodules are stable at time of repeat CT, then future CT at 18-24 months (from today's scan) is considered optional for low-risk patients, but is recommended for high-risk patients. This recommendation follows the consensus statement: Guidelines for Management of Incidental Pulmonary Nodules Detected on CT Images: From the Fleischner Society 2017; Radiology 2017; 284:228-243. Aortic Atherosclerosis  (ICD10-I70.0). Electronically Signed   By: Franki Cabot M.D.   On: 05/05/2018 17:07   ASSESSMENT AND PLAN:    Patient is a 73 year old with history of coronary artery disease presenting with chest pain  1.  Chest pains Patient described chest pain as being in the center of the chest with severity of 9/10 and radiating towards the neck.  No recurrence of chest pain overnight.  CTA chest done with no acute findings Patient does have positive history of CAD with remote history of stent placement.  Patient already seen by cardiologist.  Acute coronary syndrome ruled out.  Given patient's symptoms and prior history, cardiologist planning on cardiac catheterization tomorrow. We will keep n.p.o. after midnight for cardiac catheterization in a.m.  2.  Hypertension; controlled  continue Norvasc and Cozaar  3.  Diabetes type 2  Discontinued metformin due to plans for cardiac catheterization.  Placed on gentle IV fluid hydration with normal saline at 100 cc an hour for renal protection especially due to patient having had CTA chest done yesterday. Placed on sliding scale insulin coverage.    4.  Hyperlipidemia continue Pravachol fasting  Lipid panel done with LDL of 83  DVT prophylaxis; Lovenox  All the records are reviewed and case discussed with Care Management/Social Worker. Management plans discussed with the patient, family and they are in agreement.  CODE STATUS: Full Code  TOTAL TIME TAKING CARE OF THIS PATIENT: 38 minutes.   More than 50% of the time was spent in counseling/coordination  of care: YES  POSSIBLE D/C IN 1-2 DAYS, DEPENDING ON CLINICAL CONDITION.   Damali Broadfoot M.D on 05/06/2018 at 1:21 PM  Between 7am to 6pm - Pager - [phone]  After 6pm go to [email or web address] - Proofreader  [facility]  Office  [phone]  CC: Primary care physician; System, Pcp Not In  Note: This dictation was prepared with Dragon dictation along with  smaller phrase technology. Any transcriptional errors that result from this process are unintentional.

## 2018-05-06 NOTE — Care Management Obs Status (Signed)
[facility] NOTIFICATION   Patient Details  Name: KAZARIA GAERTNER MRN: [id number] Date of Birth: 23-Feb-1945   Medicare Observation Status Notification Given:  Yes    Judia Arnott A Vedha Tercero, RN 05/06/2018, 12:28 PM

## 2018-05-06 NOTE — Progress Notes (Signed)
Subjective:  Patient states still no further chest discomfort lying comfortably in bed is still concerned about anginal symptoms pain she describes is worse than she ever felt after discussion with the family she preferred to have definitive evaluation including cardiac cath prior to discharge  Objective:  Vital Signs in the last 24 hours: Temp:  [98 F (36.7 C)-98.8 F (37.1 C)] 98.4 F (36.9 C) (03/22 2002) Pulse Rate:  [62-76] 76 (03/22 2002) Resp:  [18-20] 20 (03/22 2002) BP: (121-149)/(52-77) 149/69 (03/22 2002) SpO2:  [95 %-99 %] 99 % (03/22 2002)  Intake/Output from previous day: 03/21 0701 - 03/22 0700 In: -  Out: 1400 [Urine:1400] Intake/Output from this shift: Total I/O In: -  Out: 1600 [Urine:1600]  Physical Exam: General appearance: appears stated age Neck: no adenopathy, no carotid bruit, no JVD, supple, symmetrical, trachea midline and thyroid not enlarged, symmetric, no tenderness/mass/nodules Lungs: clear to auscultation bilaterally Heart: regular rate and rhythm, S1, S2 normal, no murmur, click, rub or gallop Abdomen: soft, non-tender; bowel sounds normal; no masses,  no organomegaly Extremities: extremities normal, atraumatic, no cyanosis or edema Pulses: 2+ and symmetric Skin: Skin color, texture, turgor normal. No rashes or lesions Neurologic: Alert and oriented X 3, normal strength and tone. Normal symmetric reflexes. Normal coordination and gait  Lab Results: Recent Labs    05/05/18 1545 05/06/18 0454  WBC 6.1 4.8  HGB 11.6* 10.7*  PLT 228 174   Recent Labs    05/05/18 1545 05/06/18 0454 05/06/18 1336  NA 132* 135  --   K 3.4* 2.8* 3.7  CL 95* 98  --   CO2 23 27  --   GLUCOSE 293* 245*  --   BUN 14 12  --   CREATININE 0.94 0.76  --    Recent Labs    05/06/18 0454 05/06/18 1139  TROPONINI <0.03 <0.03   Hepatic Function Panel No results for input(s): PROT, ALBUMIN, AST, ALT, ALKPHOS, BILITOT, BILIDIR, IBILI in the last 72  hours. Recent Labs    05/06/18 0454  CHOL 165   No results for input(s): PROTIME in the last 72 hours.  Imaging: Imaging results have been reviewed  Cardiac Studies:  Assessment/Plan:  Angina Chest Pain Coronary Artery Disease Coronary Artery Stent Shortness of Breath  Hypertension Hyperlipidemia GERD . Plan Agree with continued therapy with short-term anticoagulation Negative troponins patient is ruled out After discussion with the family and patient day with preferred cardiac cath prior to discharge since she is already had a stress test Continue hypertension management with amlodipine chlorthalidone losartan metoprolol Continue Pravachol therapy for lipid management Patient on diabetes management with Victoza will hold metformin prior to cardiac cath Agree with Protonix therapy for reflux symptoms  LOS: 0 days    Dwayne D Callwood 05/06/2018, 10:37 PM

## 2018-05-06 NOTE — Plan of Care (Signed)
Patients potassium was 2.8, received PO replacement, potassium now 3.7. No c/o pain throughout the shift.   Problem: Clinical Measurements: Goal: Ability to maintain clinical measurements within normal limits will improve Outcome: Progressing   Problem: Cardiac: Goal: Ability to achieve and maintain adequate cardiovascular perfusion will improve Outcome: Progressing   Problem: Pain Managment: Goal: General experience of comfort will improve Outcome: Progressing   Problem: Safety: Goal: Ability to remain free from injury will improve Outcome: Progressing

## 2018-05-06 NOTE — Progress Notes (Signed)
Pt's potassium 2.8 this morning Dr. Marcille Blanco notified, he placed orders for oral replacement.  Conley Simmonds, RN, BSN

## 2018-05-07 ENCOUNTER — Encounter
Admission: EM | Disposition: A | Discharge status: Home / Self Care | Payer: Self-pay | Source: Home / Self Care | Attending: Emergency Medicine

## 2018-05-07 HISTORY — PX: LEFT HEART CATH AND CORONARY ANGIOGRAPHY: CATH118249

## 2018-05-07 LAB — HEMOGLOBIN A1C
HEMOGLOBIN A1C: 11.5 % — AB (ref 4.8–5.6)
Mean Plasma Glucose: 283.35 mg/dL

## 2018-05-07 LAB — MAGNESIUM: Magnesium: 2 mg/dL (ref 1.7–2.4)

## 2018-05-07 LAB — BASIC METABOLIC PANEL
Anion gap: 8 (ref 5–15)
BUN: 15 mg/dL (ref 8–23)
CHLORIDE: 103 mmol/L (ref 98–111)
CO2: 25 mmol/L (ref 22–32)
CREATININE: 0.73 mg/dL (ref 0.44–1.00)
Calcium: 8.6 mg/dL — ABNORMAL LOW (ref 8.9–10.3)
GFR calc non Af Amer: >60 mL/min (ref >60)
Glucose, Bld: 265 mg/dL — ABNORMAL HIGH (ref 70–99)
Potassium: 3.5 mmol/L (ref 3.5–5.1)
Sodium: 136 mmol/L (ref 135–145)

## 2018-05-07 LAB — GLUCOSE, CAPILLARY
Glucose-Capillary: 255 mg/dL — ABNORMAL HIGH (ref 70–99)
Glucose-Capillary: 268 mg/dL — ABNORMAL HIGH (ref 70–99)
Glucose-Capillary: 236 mg/dL — ABNORMAL HIGH (ref 70–99)

## 2018-05-07 SURGERY — LEFT HEART CATH AND CORONARY ANGIOGRAPHY
Anesthesia: Moderate Sedation

## 2018-05-07 MED ORDER — VERAPAMIL HCL 2.5 MG/ML IV SOLN
INTRAVENOUS | Status: AC
  Filled 2018-05-07: qty 2

## 2018-05-07 MED ORDER — MIDAZOLAM HCL 2 MG/2ML IJ SOLN
INTRAMUSCULAR | Status: DC | PRN
  Administered 2018-05-07: 1 mg via INTRAVENOUS

## 2018-05-07 MED ORDER — ONDANSETRON HCL 4 MG/2ML IJ SOLN: 4.0000 mg | Freq: Four times a day (QID) | INTRAMUSCULAR | Status: DC | PRN

## 2018-05-07 MED ORDER — FENTANYL CITRATE (PF) 100 MCG/2ML IJ SOLN
INTRAMUSCULAR | Status: DC | PRN
  Administered 2018-05-07: 25 ug via INTRAVENOUS

## 2018-05-07 MED ORDER — SODIUM CHLORIDE 0.9 % IV SOLN: 250.0000 mL | INTRAVENOUS | Status: DC | PRN

## 2018-05-07 MED ORDER — SODIUM CHLORIDE 0.9% FLUSH: 3.0000 mL | INTRAVENOUS | Status: DC | PRN

## 2018-05-07 MED ORDER — SODIUM CHLORIDE 0.9 % WEIGHT BASED INFUSION: 1.0000 mL/kg/h | INTRAVENOUS | Status: DC

## 2018-05-07 MED ORDER — IOHEXOL 300 MG/ML  SOLN
INTRAMUSCULAR | Status: DC | PRN
  Administered 2018-05-07: 30 mg via INTRA_ARTERIAL

## 2018-05-07 MED ORDER — FENTANYL CITRATE (PF) 100 MCG/2ML IJ SOLN
INTRAMUSCULAR | Status: AC
  Filled 2018-05-07: qty 2

## 2018-05-07 MED ORDER — VERAPAMIL HCL 2.5 MG/ML IV SOLN
INTRAVENOUS | Status: DC | PRN
  Administered 2018-05-07: 2.5 mg via INTRA_ARTERIAL

## 2018-05-07 MED ORDER — ACETAMINOPHEN 325 MG PO TABS: 650.0000 mg | ORAL_TABLET | ORAL | Status: DC | PRN

## 2018-05-07 MED ORDER — IOPAMIDOL (ISOVUE-300) INJECTION 61%
INTRAVENOUS | Status: DC | PRN
  Administered 2018-05-07: 50 mL via INTRA_ARTERIAL

## 2018-05-07 MED ORDER — SODIUM CHLORIDE 0.9% FLUSH: 3.0000 mL | Freq: Two times a day (BID) | INTRAVENOUS | Status: DC

## 2018-05-07 MED ORDER — ASPIRIN 81 MG PO CHEW: 81.0000 mg | CHEWABLE_TABLET | ORAL | Status: DC

## 2018-05-07 MED ORDER — HEPARIN SODIUM (PORCINE) 1000 UNIT/ML IJ SOLN
INTRAMUSCULAR | Status: AC
  Filled 2018-05-07: qty 1

## 2018-05-07 MED ORDER — HEPARIN SODIUM (PORCINE) 1000 UNIT/ML IJ SOLN
INTRAMUSCULAR | Status: DC | PRN
  Administered 2018-05-07: 4000 [IU] via INTRAVENOUS

## 2018-05-07 MED ORDER — NITROGLYCERIN 0.4 MG SL SUBL: 0.4000 mg | SUBLINGUAL_TABLET | SUBLINGUAL | 0 refills | Status: DC | PRN

## 2018-05-07 MED ORDER — MIDAZOLAM HCL 2 MG/2ML IJ SOLN
INTRAMUSCULAR | Status: AC
  Filled 2018-05-07: qty 2

## 2018-05-07 MED ORDER — SODIUM CHLORIDE 0.9 % WEIGHT BASED INFUSION
3.0000 mL/kg/h | INTRAVENOUS | Status: DC
  Administered 2018-05-07: 3 mL/kg/h via INTRAVENOUS

## 2018-05-07 MED ORDER — HEPARIN (PORCINE) IN NACL 1000-0.9 UT/500ML-% IV SOLN
INTRAVENOUS | Status: AC
  Filled 2018-05-07: qty 1000

## 2018-05-07 SURGICAL SUPPLY — 14 items
CATH INFINITI 5 FR JL3.5 (CATHETERS) ×3 IMPLANT
CATH INFINITI 5FR ANG PIGTAIL (CATHETERS) ×3 IMPLANT
CATH INFINITI JR4 5F (CATHETERS) ×3 IMPLANT
DEVICE CLOSURE MYNXGRIP 5F (Vascular Products) ×3 IMPLANT
DEVICE RAD TR BAND REGULAR (VASCULAR PRODUCTS) ×3 IMPLANT
GLIDESHEATH SLEND A-KIT 6F 22G (SHEATH) ×3 IMPLANT
GUIDEWIRE .025 260CM (WIRE) ×3 IMPLANT
KIT MANI 3VAL PERCEP (MISCELLANEOUS) ×3 IMPLANT
NEEDLE PERC 18GX7CM (NEEDLE) ×3 IMPLANT
PACK CARDIAC CATH (CUSTOM PROCEDURE TRAY) ×3 IMPLANT
SHEATH AVANTI 5FR X 11CM (SHEATH) ×3 IMPLANT
WIRE GUIDERIGHT .035X150 (WIRE) ×3 IMPLANT
WIRE HITORQ VERSACORE ST 145CM (WIRE) ×3 IMPLANT
WIRE ROSEN-J .035X260CM (WIRE) ×3 IMPLANT

## 2018-05-07 NOTE — Discharge Summary (Signed)
[facility] NAME: Kerri Mccullough    MR#:  [id number]  DATE OF BIRTH:  1945/12/26  DATE OF ADMISSION:  05/05/2018   ADMITTING PHYSICIAN: Dustin Flock, MD  DATE OF DISCHARGE: 05/08/2018  PRIMARY CARE PHYSICIAN: System, Pcp Not In   ADMISSION DIAGNOSIS:  Coronary artery disease with exertional angina ([place]) [I25.118] DISCHARGE DIAGNOSIS:  Active Problems:   Chest pain  SECONDARY DIAGNOSIS:   Past Medical History:  Diagnosis Date  . Diabetes mellitus without complication ([place])   . GERD (gastroesophageal reflux disease)   . Heart attack ([place])   . High cholesterol   . Hypertension    HOSPITAL COURSE:  Chief complaint;Chest pain   HISTORY OF PRESENT ILLNESS:  Kerri Mccullough  is a 73 y.o. female with a known history of diabetes type 2, GERD, coronary artery disease previous 2 stents, high cholesterol and hypertension presenting to the emergency room with complaint of chest pain.  Patient stated that she was at a [place] store when all of a sudden she started having chest pain.  She describes the pain as a sharp pain.  Also symptoms went up to her throat.  She did not get short of breath.  She states that she had to sit down.  She is followed at [facility] and had a stress test 1 week ago which supposedly she was mostly normal.  Please refer to the H&P dictated for further details  Hospital course; 1. Atypicalchest pains; resolved Patient apparently had a recent negative stress test at [facility] prior to this admission.  Was seen by cardiologist during this admission.  Had cardiac catheterization done by Dr. Clayborn Bigness today which was negative.  Discussed with cardiologist Dr. Clayborn Bigness who has given clearance to discharge patient today.  Patient remains asymptomatic.  Patient has remote history of CAD with previous stent placement but this appears stable. Clinically and hemodynamically stable and wishes to be discharged home today.   2.Hypertension; controlled  continue Norvasc and Cozaar  3.Diabetes type 2  Metformin placed on hold due to cardiac catheterization today.  Patient to wait for 72 hours before resuming metformin.  Adequately hydrated with IV fluids.  4.Hyperlipidemia continue Pravachol fasting  Lipid panel done with LDL of 83    DISCHARGE CONDITIONS:  Stable CONSULTS OBTAINED:   DRUG ALLERGIES:   Allergies  Allergen Reactions  . Ace Inhibitors Swelling  . Rosiglitazone Other (See Comments)    Other Reaction: SOB, Pulmonary Edema Other Reaction: SOB, Pulmonary Edema Other Reaction: SOB, Pulmonary Edema   . Aspirin Swelling    When taken with a beta blocker   . Golytely [Peg 3350-Electrolytes] Swelling   DISCHARGE MEDICATIONS:   Allergies as of 05/07/2018      Reactions   Ace Inhibitors Swelling   Rosiglitazone Other (See Comments)   Other Reaction: SOB, Pulmonary Edema Other Reaction: SOB, Pulmonary Edema Other Reaction: SOB, Pulmonary Edema   Aspirin Swelling   When taken with a beta blocker    Golytely [peg 3350-electrolytes] Swelling      Medication List    STOP taking these medications   metFORMIN 1000 MG tablet Commonly known as:  GLUCOPHAGE     TAKE these medications   amLODipine 5 MG tablet Commonly known as:  NORVASC Take 5 mg by mouth 2 (two) times daily.   aspirin EC 81 MG tablet Take 81 mg by mouth daily.   chlorthalidone 25 MG tablet Commonly known as:  HYGROTON Take 25 mg  by mouth daily.   citalopram 20 MG tablet Commonly known as:  CELEXA Take 20 mg by mouth daily.   gabapentin 100 MG capsule Commonly known as:  NEURONTIN Take 300 mg by mouth Nightly.   losartan 25 MG tablet Commonly known as:  COZAAR Take 25 mg by mouth daily.   metoprolol succinate 25 MG 24 hr tablet Commonly known as:  TOPROL-XL Take 25 mg by mouth daily.   mirabegron ER 50 MG Tb24 tablet Commonly known as:  MYRBETRIQ Take 50 mg by mouth daily.   nitroGLYCERIN  0.4 MG SL tablet Commonly known as:  NITROSTAT Place 1 tablet (0.4 mg total) under the tongue every 5 (five) minutes as needed for chest pain.   omeprazole 40 MG capsule Commonly known as:  PRILOSEC Take 40 mg by mouth daily.   pravastatin 40 MG tablet Commonly known as:  PRAVACHOL Take 40 mg by mouth daily.   Victoza 18 MG/3ML Sopn Generic drug:  liraglutide Inject 0.6 mg into the skin daily.        DISCHARGE INSTRUCTIONS:   DIET:  Diabetic diet DISCHARGE CONDITION:  Stable ACTIVITY:  Activity as tolerated OXYGEN:  Home Oxygen: No.  Oxygen Delivery: room air DISCHARGE LOCATION:  home   If you experience worsening of your admission symptoms, develop shortness of breath, life threatening emergency, suicidal or homicidal thoughts you must seek medical attention immediately by calling 911 or calling your MD immediately  if symptoms less severe.  You Must read complete instructions/literature along with all the possible adverse reactions/side effects for all the Medicines you take and that have been prescribed to you. Take any new Medicines after you have completely understood and accpet all the possible adverse reactions/side effects.   Please note  You were cared for by a hospitalist during your hospital stay. If you have any questions about your discharge medications or the care you received while you were in the hospital after you are discharged, you can call the unit and asked to speak with the hospitalist on call if the hospitalist that took care of you is not available. Once you are discharged, your primary care physician will handle any further medical issues. Please note that NO REFILLS for any discharge medications will be authorized once you are discharged, as it is imperative that you return to your primary care physician (or establish a relationship with a primary care physician if you do not have one) for your aftercare needs so that they can reassess your need for  medications and monitor your lab values.    On the day of Discharge:  VITAL SIGNS:  Blood pressure 128/65, pulse (!) 59, temperature 97.9 F (36.6 C), temperature source Oral, resp. rate 20, height 5\' 5"  (1.651 m), weight 82.7 kg, SpO2 94 %. PHYSICAL EXAMINATION:  GENERAL:  73 y.o.-year-old patient lying in the bed with no acute distress.  EYES: Pupils equal, round, reactive to light and accommodation. No scleral icterus. Extraocular muscles intact.  HEENT: Head atraumatic, normocephalic. Oropharynx and nasopharynx clear.  NECK:  Supple, no jugular venous distention. No thyroid enlargement, no tenderness.  LUNGS: Normal breath sounds bilaterally, no wheezing, rales,rhonchi or crepitation. No use of accessory muscles of respiration.  CARDIOVASCULAR: S1, S2 normal. No murmurs, rubs, or gallops.  ABDOMEN: Soft, non-tender, non-distended. Bowel sounds present. No organomegaly or mass.  EXTREMITIES: No pedal edema, cyanosis, or clubbing.  NEUROLOGIC: Cranial nerves II through XII are intact. Muscle strength 5/5 in all extremities. Sensation intact. Gait not checked.  PSYCHIATRIC: The patient is alert and oriented x 3.  SKIN: No obvious rash, lesion, or ulcer.  DATA REVIEW:   CBC Recent Labs  Lab 05/06/18 0454  WBC 4.8  HGB 10.7*  HCT 34.8*  PLT 174    Chemistries  Recent Labs  Lab 05/07/18 0446  NA 136  K 3.5  CL 103  CO2 25  GLUCOSE 265*  BUN 15  CREATININE 0.73  CALCIUM 8.6*  MG 2.0     Microbiology Results  No results found for this or any previous visit.  RADIOLOGY:  No results found.   Management plans discussed with the patient, family and they are in agreement.  CODE STATUS: Full Code   TOTAL TIME TAKING CARE OF THIS PATIENT: 36 minutes.    Linn Goetze M.D on 05/07/2018 at 2:38 PM  Between 7am to 6pm - Pager - [phone]  After 6pm go to [email or web address] - Proofreader  [facility] [place]  Office  [phone]  CC: Primary  care physician; System, Pcp Not In   Note: This dictation was prepared with Dragon dictation along with smaller phrase technology. Any transcriptional errors that result from this process are unintentional.

## 2018-05-07 NOTE — Progress Notes (Signed)
Patient down to specials for cardiac cath.  Report given to specials.

## 2018-05-07 NOTE — Progress Notes (Signed)
Patient returned from cath s/p R radial attempt and R groin access.  R radial site without drainage C/D/I.  R femoral site with dressing C/D/I.  Patient with no c/o pain or SOB at this time. Plan is for discharge.

## 2018-05-07 NOTE — Progress Notes (Signed)
Patient discharged to home.  Tele and IV d/c'd prior to discharge.  Verbalizes understanding of discharge instructions and education on post cath site care.

## 2018-05-07 NOTE — Progress Notes (Signed)
Received report from Philemon Kingdom, RN and assumed care of pt. Now. Right radial site intact with TR Band. Right groin site clean,dry, intact without complications to either site. Pt. Without any cardiac or subjective c/o.

## 2018-05-07 NOTE — Progress Notes (Signed)
TR band removed from right radial site. Site dressed with sterile 2x2 and Tegaderm drsg. Site without hematoma, edema, drainage, ecchymosis, redness. +palp. Pulse to radial and brachial sites. Right groin without any complications at present. Pt. Denies any c/o CP,SOB, N/V, HA, dizziness. Pt. Voided and pericare given. Clean linens applied.

## 2018-05-07 NOTE — Progress Notes (Signed)
Inpatient Diabetes Program Recommendations  AACE/ADA: New Consensus Statement on Inpatient Glycemic Control (2015)  Target Ranges:  Prepandial:   less than 140 mg/dL      Peak postprandial:   less than 180 mg/dL (1-2 hours)      Critically ill patients:  140 - 180 mg/dL   Results for Kerri Mccullough, Kerri Mccullough (MRN [id number]) as of 05/07/2018 07:55  Ref. Range 05/06/2018 08:02 05/06/2018 11:42 05/06/2018 16:59 05/06/2018 20:56  Glucose-Capillary Latest Ref Range: 70 - 99 mg/dL 237 (H) 263 (H) 238 (H)  Refused Novolog 295 (H)   Results for Kerri Mccullough, Kerri Mccullough (MRN [id number]) as of 05/07/2018 07:55  Ref. Range 05/07/2018 07:41  Glucose-Capillary Latest Ref Range: 70 - 99 mg/dL 255 (H)   Results for Kerri Mccullough, Kerri Mccullough (MRN [id number]) as of 05/07/2018 07:55  Ref. Range 05/05/2018 15:45  Hemoglobin A1C Latest Ref Range: 4.8 - 5.6 % 11.5 (H)  (283 mg/dl)    Admit with: CP  History: DM  Home DM Meds: Metformin 1000 mg BID       Victoza 0.6 mg Daily  Current Orders: Novolog Sensitive Correction Scale/ SSI (0-9 units) TID AC     NPO for Cardiac Cath today.  Current A1c of 11.5% shows poor glucose control prior to admission.  PCP: Dr. Charlott Holler with [facility]--Last seen 04/11/2018--Was started on Victoza injection at that visit in addition to her Metformin.    MD- Please consider the following in-hospital insulin adjustments while home meds are on hold:  Start Lantus 15 units Daily (0.2 units/kg dosing based on weight of 82 kg)   Patient needs close follow up with PCP for further Diabetes management     --Will follow patient during hospitalization--  Wyn Quaker RN, MSN, CDE Diabetes Coordinator Inpatient Glycemic Control Team Team Pager: [phone] (8a-5p)

## 2018-05-08 ENCOUNTER — Encounter: Payer: Self-pay | Admitting: Internal Medicine

## 2018-09-11 DIAGNOSIS — R1013 Epigastric pain: Secondary | ICD-10-CM | POA: Insufficient documentation

## 2019-01-20 ENCOUNTER — Emergency Department
Admission: EM | Admit: 2019-01-20 | Discharge: 2019-01-21 | Disposition: A | Discharge status: Home / Self Care | Payer: Medicare HMO | Attending: Emergency Medicine | Admitting: Emergency Medicine

## 2019-01-20 ENCOUNTER — Other Ambulatory Visit: Payer: Self-pay

## 2019-01-20 DIAGNOSIS — Y93G1 Activity, food preparation and clean up: Secondary | ICD-10-CM | POA: Insufficient documentation

## 2019-01-20 DIAGNOSIS — S61411A Laceration without foreign body of right hand, initial encounter: Secondary | ICD-10-CM

## 2019-01-20 DIAGNOSIS — I252 Old myocardial infarction: Secondary | ICD-10-CM | POA: Insufficient documentation

## 2019-01-20 DIAGNOSIS — Z7982 Long term (current) use of aspirin: Secondary | ICD-10-CM | POA: Diagnosis not present

## 2019-01-20 DIAGNOSIS — W25XXXA Contact with sharp glass, initial encounter: Secondary | ICD-10-CM | POA: Insufficient documentation

## 2019-01-20 DIAGNOSIS — I10 Essential (primary) hypertension: Secondary | ICD-10-CM | POA: Diagnosis not present

## 2019-01-20 DIAGNOSIS — S61210A Laceration without foreign body of right index finger without damage to nail, initial encounter: Secondary | ICD-10-CM | POA: Diagnosis not present

## 2019-01-20 DIAGNOSIS — Y929 Unspecified place or not applicable: Secondary | ICD-10-CM | POA: Insufficient documentation

## 2019-01-20 DIAGNOSIS — Z23 Encounter for immunization: Secondary | ICD-10-CM | POA: Insufficient documentation

## 2019-01-20 DIAGNOSIS — Y999 Unspecified external cause status: Secondary | ICD-10-CM | POA: Diagnosis not present

## 2019-01-20 DIAGNOSIS — E119 Type 2 diabetes mellitus without complications: Secondary | ICD-10-CM | POA: Diagnosis not present

## 2019-01-20 DIAGNOSIS — Z79899 Other long term (current) drug therapy: Secondary | ICD-10-CM | POA: Diagnosis not present

## 2019-01-20 DIAGNOSIS — S6991XA Unspecified injury of right wrist, hand and finger(s), initial encounter: Secondary | ICD-10-CM | POA: Diagnosis present

## 2019-01-20 MED ORDER — LIDOCAINE-EPINEPHRINE-TETRACAINE (LET) TOPICAL GEL: 3.0000 mL | Freq: Once | TOPICAL | Status: DC

## 2019-01-20 MED ORDER — BACITRACIN-NEOMYCIN-POLYMYXIN 400-5-5000 EX OINT
TOPICAL_OINTMENT | Freq: Once | CUTANEOUS | Status: AC
  Administered 2019-01-21: 1 via TOPICAL
  Filled 2019-01-20: qty 1

## 2019-01-20 MED ORDER — LIDOCAINE HCL (PF) 1 % IJ SOLN
INTRAMUSCULAR | Status: AC
  Filled 2019-01-20: qty 5

## 2019-01-20 MED ORDER — SULFAMETHOXAZOLE-TRIMETHOPRIM 800-160 MG PO TABS
1.0000 | ORAL_TABLET | Freq: Once | ORAL | Status: AC
  Administered 2019-01-21: 1 via ORAL
  Filled 2019-01-20: qty 1

## 2019-01-20 MED ORDER — TETANUS-DIPHTH-ACELL PERTUSSIS 5-2.5-18.5 LF-MCG/0.5 IM SUSP
0.5000 mL | Freq: Once | INTRAMUSCULAR | Status: AC
  Administered 2019-01-20: 0.5 mL via INTRAMUSCULAR
  Filled 2019-01-20: qty 0.5

## 2019-01-20 MED ORDER — SULFAMETHOXAZOLE-TRIMETHOPRIM 800-160 MG PO TABS: 1.0000 | ORAL_TABLET | Freq: Two times a day (BID) | ORAL | 0 refills | Status: DC

## 2019-01-20 NOTE — ED Provider Notes (Signed)
[facility] Emergency Department Provider Note ____________________________________________  Time seen: 1036  I have reviewed the triage vital signs and the nursing notes.  HISTORY  Chief Complaint  Laceration   HPI Kerri Mccullough is a 73 y.o. female presents to the clinic today with c/o laceration at the base of her 1st finger. She reports she was washing a glass when it broke, cutting her hand. She was unable to control the bleeding at home, so she called EMS. They dressed it and advised her to come to the ER for stitches. She has no idea when her last tetanus injection was.  Past Medical History:  Diagnosis Date  . Diabetes mellitus without complication ([place])   . GERD (gastroesophageal reflux disease)   . Heart attack ([place])   . High cholesterol   . Hypertension     Patient Active Problem List   Diagnosis Date Noted  . Chest pain 05/05/2018    Past Surgical History:  Procedure Laterality Date  . ABDOMINAL HYSTERECTOMY    . CORONARY ANGIOPLASTY WITH STENT PLACEMENT    . LEFT HEART CATH AND CORONARY ANGIOGRAPHY N/A 05/07/2018   Procedure: LEFT HEART CATH AND CORONARY ANGIOGRAPHY with possible PCI and stent;  Surgeon: Yolonda Kida, MD;  Location: [facility];  Service: Cardiovascular;  Laterality: N/A;    Prior to Admission medications   Medication Sig Start Date End Date Taking? Authorizing Provider  amLODipine (NORVASC) 5 MG tablet Take 5 mg by mouth 2 (two) times daily. 04/05/18   [provider]  aspirin EC 81 MG tablet Take 81 mg by mouth daily. 11/09/11   [provider]  chlorthalidone (HYGROTON) 25 MG tablet Take 25 mg by mouth daily. 04/05/18   [provider]  citalopram (CELEXA) 20 MG tablet Take 20 mg by mouth daily. 04/05/18   [provider]  gabapentin (NEURONTIN) 100 MG capsule Take 300 mg by mouth Nightly. 04/05/18   [provider]  losartan (COZAAR) 25 MG tablet Take 25 mg by  mouth daily. 04/05/18   [provider]  metoprolol succinate (TOPROL-XL) 25 MG 24 hr tablet Take 25 mg by mouth daily.    [provider]  mirabegron ER (MYRBETRIQ) 50 MG TB24 tablet Take 50 mg by mouth daily. 04/23/18   [provider]  nitroGLYCERIN (NITROSTAT) 0.4 MG SL tablet Place 1 tablet (0.4 mg total) under the tongue every 5 (five) minutes as needed for chest pain. 05/07/18   Stark Jock Jude, MD  omeprazole (PRILOSEC) 40 MG capsule Take 40 mg by mouth daily. 04/05/18   [provider]  pravastatin (PRAVACHOL) 40 MG tablet Take 40 mg by mouth daily. 04/05/18   [provider]  sulfamethoxazole-trimethoprim (BACTRIM DS) 800-160 MG tablet Take 1 tablet by mouth 2 (two) times daily. 01/20/19   Jearld Fenton, NP  VICTOZA 18 MG/3ML SOPN Inject 0.6 mg into the skin daily. 04/19/18   [provider]    Allergies Ace inhibitors, Rosiglitazone, Aspirin, and Golytely [peg 3350-electrolytes]  No family history on file.  Social History Social History   Tobacco Use  . Smoking status: Never Smoker  . Smokeless tobacco: Never Used  Substance Use Topics  . Alcohol use: No  . Drug use: No    Review of Systems  Constitutional: Negative for fever, chills or body aches. Cardiovascular: Negative for chest pain or chest tightness. Respiratory: Negative for cough or shortness of breath. Skin: Positive for laceration of right hand.  Neurological:  Negative for  focal weakness, tingling or numbness. ____________________________________________  PHYSICAL EXAM:  VITAL SIGNS: ED Triage Vitals  Enc Vitals Group     BP 01/20/19 2230 130/60     Pulse Rate 01/20/19 2230 (!) 59     Resp 01/20/19 2230 18     Temp 01/20/19 2230 98.9 F (37.2 C)     Temp src --      SpO2 01/20/19 2230 100 %     Weight 01/20/19 2231 185 lb (83.9 kg)     Height 01/20/19 2231 5\' 4"  (1.626 m)     Head Circumference --      Peak Flow --      Pain Score 01/20/19 2231 6      Pain Loc --      Pain Edu? --      Excl. in [place]? --     Constitutional: Alert and oriented. Well appearing and in no distress. Cardiovascular: Normal rate, regular rhythm. Radial pulses 2+ bilaterally. Respiratory: Normal respiratory effort. No wheezes/rales/rhonchi. Musculoskeletal: Normal flexion and extension of the fingers, right hand. Neurologic: Normal speech and language. No gross focal neurologic deficits are appreciated. Skin:  "U" shaped laceration at base of right 1st finger. ____________________________________________  PROCEDURES  .Marland KitchenLaceration Repair  Date/Time: 01/20/2019 11:33 PM Performed by: Jearld Fenton, NP Authorized by: Jearld Fenton, NP   Consent:    Consent obtained:  Verbal   Consent given by:  Patient   Risks discussed:  Infection, pain and poor cosmetic result   Alternatives discussed:  No treatment Anesthesia (see MAR for exact dosages):    Anesthesia method:  Local infiltration   Local anesthetic:  Lidocaine 1% w/o epi Laceration details:    Location:  Hand   Hand location:  R hand, dorsum   Length (cm):  2   Depth (mm):  2 Repair type:    Repair type:  Simple Pre-procedure details:    Preparation:  Patient was prepped and draped in usual sterile fashion Exploration:    Hemostasis achieved with:  Direct pressure   Wound exploration: wound explored through full range of motion and entire depth of wound probed and visualized     Contaminated: no   Treatment:    Area cleansed with:  Betadine and saline   Amount of cleaning:  Standard   Irrigation solution:  Sterile saline   Irrigation method:  Syringe   Visualized foreign bodies/material removed: no   Skin repair:    Repair method:  Sutures   Suture size:  6-0   Suture material:  Nylon   Suture technique:  Simple interrupted   Number of sutures:  10 Approximation:    Approximation:  Close Post-procedure details:    Dressing:  Antibiotic ointment and bulky dressing   Patient  tolerance of procedure:  Tolerated well, no immediate complications    ____________________________________________  INITIAL IMPRESSION / ASSESSMENT AND PLAN / ED COURSE  Laceration of Right Hand:  Sutures placed- see procedure note Septra DS 1 tab PO in ER RX for Septra DS 1 tab PO BID x 7 days Tdap given Aftercare instructions provided ____________________________________________  FINAL CLINICAL IMPRESSION(S) / ED DIAGNOSES  Final diagnoses:  Laceration of right hand without foreign body, initial encounter   Webb Silversmith, NP    Jearld Fenton, NP 01/20/19 2336    Arta Silence, MD 01/21/19 [phone]

## 2019-01-20 NOTE — ED Triage Notes (Signed)
Pt with right hand laceration from a drinking glass. Pt with "u" shaped laceration to base of left thumb. Bleeding controlled with pressure.

## 2019-01-20 NOTE — Discharge Instructions (Addendum)
You were seen today for laceration of your right hand.  He received 10 sutures today.  I am giving you a prescription for antibiotics to take twice daily for the next 7 days.  Monitor for increased redness, swelling, drainage or pain.  You will need to follow-up with your PCP in 1 week for suture removal.

## 2019-06-18 ENCOUNTER — Other Ambulatory Visit: Payer: Self-pay | Admitting: Physician Assistant

## 2019-06-18 DIAGNOSIS — M810 Age-related osteoporosis without current pathological fracture: Secondary | ICD-10-CM

## 2019-06-21 ENCOUNTER — Other Ambulatory Visit: Payer: Self-pay | Admitting: Physician Assistant

## 2019-06-21 DIAGNOSIS — R918 Other nonspecific abnormal finding of lung field: Secondary | ICD-10-CM

## 2019-07-02 ENCOUNTER — Ambulatory Visit: Payer: Medicare HMO

## 2019-07-02 ENCOUNTER — Ambulatory Visit
Admission: RE | Admit: 2019-07-02 | Discharge: 2019-07-02 | Disposition: A | Discharge status: Home / Self Care | Payer: Medicare HMO | Source: Ambulatory Visit | Attending: Physician Assistant | Admitting: Physician Assistant

## 2019-07-02 DIAGNOSIS — M810 Age-related osteoporosis without current pathological fracture: Secondary | ICD-10-CM | POA: Diagnosis present

## 2019-07-08 ENCOUNTER — Ambulatory Visit: Payer: Medicare HMO

## 2019-07-11 ENCOUNTER — Ambulatory Visit: Payer: Medicare HMO

## 2019-09-06 ENCOUNTER — Ambulatory Visit: Admission: RE | Admit: 2019-09-06 | Payer: Medicare HMO | Source: Ambulatory Visit

## 2019-09-10 ENCOUNTER — Other Ambulatory Visit: Payer: Self-pay

## 2019-09-10 ENCOUNTER — Ambulatory Visit
Admission: RE | Admit: 2019-09-10 | Discharge: 2019-09-10 | Disposition: A | Discharge status: Home / Self Care | Payer: Medicare HMO | Source: Ambulatory Visit | Attending: Physician Assistant | Admitting: Physician Assistant

## 2019-09-10 DIAGNOSIS — R918 Other nonspecific abnormal finding of lung field: Secondary | ICD-10-CM | POA: Diagnosis present

## 2019-09-27 ENCOUNTER — Other Ambulatory Visit: Admission: RE | Admit: 2019-09-27 | Payer: Medicare HMO | Source: Ambulatory Visit

## 2019-10-01 ENCOUNTER — Ambulatory Visit: Admission: RE | Admit: 2019-10-01 | Payer: Medicare HMO | Source: Home / Self Care

## 2019-10-01 ENCOUNTER — Encounter: Admission: RE | Payer: Self-pay | Source: Home / Self Care

## 2019-10-01 SURGERY — EGD (ESOPHAGOGASTRODUODENOSCOPY)
Anesthesia: General

## 2019-10-10 ENCOUNTER — Other Ambulatory Visit
Admission: RE | Admit: 2019-10-10 | Discharge: 2019-10-10 | Disposition: A | Discharge status: Home / Self Care | Payer: Medicare HMO | Source: Ambulatory Visit | Attending: Gastroenterology | Admitting: Gastroenterology

## 2019-10-10 ENCOUNTER — Other Ambulatory Visit: Payer: Self-pay

## 2019-10-10 DIAGNOSIS — Z01812 Encounter for preprocedural laboratory examination: Secondary | ICD-10-CM | POA: Diagnosis present

## 2019-10-10 DIAGNOSIS — Z20822 Contact with and (suspected) exposure to covid-19: Secondary | ICD-10-CM | POA: Insufficient documentation

## 2019-10-10 LAB — SARS CORONAVIRUS 2 (TAT 6-24 HRS): SARS Coronavirus 2: NEGATIVE

## 2019-10-14 ENCOUNTER — Encounter: Payer: Self-pay | Admitting: *Deleted

## 2019-10-14 ENCOUNTER — Ambulatory Visit
Admission: RE | Admit: 2019-10-14 | Discharge: 2019-10-14 | Disposition: A | Discharge status: Home / Self Care | Payer: Medicare HMO | Attending: Gastroenterology | Admitting: Gastroenterology

## 2019-10-14 ENCOUNTER — Other Ambulatory Visit: Payer: Self-pay

## 2019-10-14 ENCOUNTER — Encounter
Admission: RE | Disposition: A | Discharge status: Home / Self Care | Payer: Self-pay | Source: Home / Self Care | Attending: Gastroenterology

## 2019-10-14 ENCOUNTER — Ambulatory Visit: Payer: Medicare HMO | Admitting: Anesthesiology

## 2019-10-14 DIAGNOSIS — D509 Iron deficiency anemia, unspecified: Secondary | ICD-10-CM | POA: Insufficient documentation

## 2019-10-14 DIAGNOSIS — K2970 Gastritis, unspecified, without bleeding: Secondary | ICD-10-CM | POA: Diagnosis not present

## 2019-10-14 DIAGNOSIS — E119 Type 2 diabetes mellitus without complications: Secondary | ICD-10-CM | POA: Diagnosis not present

## 2019-10-14 DIAGNOSIS — I10 Essential (primary) hypertension: Secondary | ICD-10-CM | POA: Insufficient documentation

## 2019-10-14 DIAGNOSIS — Z888 Allergy status to other drugs, medicaments and biological substances status: Secondary | ICD-10-CM | POA: Insufficient documentation

## 2019-10-14 DIAGNOSIS — I252 Old myocardial infarction: Secondary | ICD-10-CM | POA: Insufficient documentation

## 2019-10-14 DIAGNOSIS — Z90710 Acquired absence of both cervix and uterus: Secondary | ICD-10-CM | POA: Insufficient documentation

## 2019-10-14 DIAGNOSIS — Z955 Presence of coronary angioplasty implant and graft: Secondary | ICD-10-CM | POA: Diagnosis not present

## 2019-10-14 DIAGNOSIS — Z79899 Other long term (current) drug therapy: Secondary | ICD-10-CM | POA: Diagnosis not present

## 2019-10-14 DIAGNOSIS — K640 First degree hemorrhoids: Secondary | ICD-10-CM | POA: Insufficient documentation

## 2019-10-14 DIAGNOSIS — K2100 Gastro-esophageal reflux disease with esophagitis, without bleeding: Secondary | ICD-10-CM | POA: Insufficient documentation

## 2019-10-14 DIAGNOSIS — K449 Diaphragmatic hernia without obstruction or gangrene: Secondary | ICD-10-CM | POA: Insufficient documentation

## 2019-10-14 DIAGNOSIS — E7800 Pure hypercholesterolemia, unspecified: Secondary | ICD-10-CM | POA: Insufficient documentation

## 2019-10-14 DIAGNOSIS — Z7982 Long term (current) use of aspirin: Secondary | ICD-10-CM | POA: Insufficient documentation

## 2019-10-14 DIAGNOSIS — R1310 Dysphagia, unspecified: Secondary | ICD-10-CM | POA: Diagnosis not present

## 2019-10-14 DIAGNOSIS — B3781 Candidal esophagitis: Secondary | ICD-10-CM | POA: Insufficient documentation

## 2019-10-14 DIAGNOSIS — K317 Polyp of stomach and duodenum: Secondary | ICD-10-CM | POA: Diagnosis not present

## 2019-10-14 DIAGNOSIS — Z886 Allergy status to analgesic agent status: Secondary | ICD-10-CM | POA: Diagnosis not present

## 2019-10-14 HISTORY — PX: ESOPHAGOGASTRODUODENOSCOPY (EGD) WITH PROPOFOL: SHX5813

## 2019-10-14 HISTORY — PX: COLONOSCOPY WITH PROPOFOL: SHX5780

## 2019-10-14 LAB — KOH PREP: Special Requests: NORMAL

## 2019-10-14 LAB — GLUCOSE, CAPILLARY: Glucose-Capillary: 136 mg/dL — ABNORMAL HIGH (ref 70–99)

## 2019-10-14 SURGERY — COLONOSCOPY WITH PROPOFOL
Anesthesia: General

## 2019-10-14 MED ORDER — PROPOFOL 500 MG/50ML IV EMUL
INTRAVENOUS | Status: AC
  Filled 2019-10-14: qty 150

## 2019-10-14 MED ORDER — FENTANYL CITRATE (PF) 100 MCG/2ML IJ SOLN
INTRAMUSCULAR | Status: DC | PRN
Start: 2019-10-14 — End: 2019-10-14
  Administered 2019-10-14 (×2): 25 ug via INTRAVENOUS
  Administered 2019-10-14: 50 ug via INTRAVENOUS

## 2019-10-14 MED ORDER — SODIUM CHLORIDE 0.9 % IV SOLN: INTRAVENOUS | Status: DC

## 2019-10-14 MED ORDER — PROPOFOL 500 MG/50ML IV EMUL
INTRAVENOUS | Status: DC | PRN
  Administered 2019-10-14: 125 ug/kg/min via INTRAVENOUS

## 2019-10-14 MED ORDER — PHENYLEPHRINE HCL (PRESSORS) 10 MG/ML IV SOLN
INTRAVENOUS | Status: DC | PRN
  Administered 2019-10-14: 100 ug via INTRAVENOUS

## 2019-10-14 MED ORDER — LIDOCAINE HCL (CARDIAC) PF 100 MG/5ML IV SOSY
PREFILLED_SYRINGE | INTRAVENOUS | Status: DC | PRN
  Administered 2019-10-14: 100 mg via INTRAVENOUS

## 2019-10-14 MED ORDER — FENTANYL CITRATE (PF) 100 MCG/2ML IJ SOLN
INTRAMUSCULAR | Status: AC
  Filled 2019-10-14: qty 2

## 2019-10-14 MED ORDER — PROPOFOL 10 MG/ML IV BOLUS
INTRAVENOUS | Status: DC | PRN
  Administered 2019-10-14: 50 mg via INTRAVENOUS
  Administered 2019-10-14: 10 mg via INTRAVENOUS

## 2019-10-14 MED ORDER — LIDOCAINE HCL (PF) 2 % IJ SOLN
INTRAMUSCULAR | Status: AC
  Filled 2019-10-14: qty 5

## 2019-10-14 NOTE — Brief Op Note (Signed)
Esophageal brushing for KOH sent to lab  

## 2019-10-14 NOTE — Interval H&P Note (Signed)
History and Physical Interval Note:  10/14/2019 8:44 AM  Kerri Mccullough  has presented today for surgery, with the diagnosis of ida.  The various methods of treatment have been discussed with the patient and family. After consideration of risks, benefits and other options for treatment, the patient has consented to  Procedure(s): COLONOSCOPY WITH PROPOFOL (N/A) ESOPHAGOGASTRODUODENOSCOPY (EGD) WITH PROPOFOL (N/A) as a surgical intervention.  The patient's history has been reviewed, patient examined, no change in status, stable for surgery.  I have reviewed the patient's chart and labs.  Questions were answered to the patient's satisfaction.     Lesly Rubenstein  Ok to proceed with EGD/Colonoscopy

## 2019-10-14 NOTE — Op Note (Signed)
[facility] Patient Name: Kerri Mccullough Procedure Date: 10/14/2019 8:44 AM MRN: [id number] Account #: [id number] Date of Birth: 02-Dec-1945 Admit Type: Outpatient Age: 74 Room: [facility] ENDO ROOM 2 Gender: Female Note Status: Finalized Procedure:             Colonoscopy Indications:           Iron deficiency anemia Providers:             Andrey Farmer MD, MD Medicines:             Monitored Anesthesia Care Complications:         No immediate complications. Procedure:             Pre-Anesthesia Assessment:                        - Prior to the procedure, a History and Physical was                         performed, and patient medications and allergies were                         reviewed. The patient is competent. The risks and                         benefits of the procedure and the sedation options and                         risks were discussed with the patient. All questions                         were answered and informed consent was obtained.                         Patient identification and proposed procedure were                         verified by the physician, the nurse, the anesthetist                         and the technician in the endoscopy suite. Mental                         Status Examination: alert and oriented. Airway                         Examination: normal oropharyngeal airway and neck                         mobility. Respiratory Examination: clear to                         auscultation. CV Examination: normal. Prophylactic                         Antibiotics: The patient does not require prophylactic                         antibiotics. Prior Anticoagulants: The patient has  taken no previous anticoagulant or antiplatelet                         agents. ASA Grade Assessment: II - A patient with mild                         systemic disease. After reviewing the risks and                          benefits, the patient was deemed in satisfactory                         condition to undergo the procedure. The anesthesia                         plan was to use monitored anesthesia care (MAC).                         Immediately prior to administration of medications,                         the patient was re-assessed for adequacy to receive                         sedatives. The heart rate, respiratory rate, oxygen                         saturations, blood pressure, adequacy of pulmonary                         ventilation, and response to care were monitored                         throughout the procedure. The physical status of the                         patient was re-assessed after the procedure.                        After obtaining informed consent, the colonoscope was                         passed under direct vision. Throughout the procedure,                         the patient's blood pressure, pulse, and oxygen                         saturations were monitored continuously. The                         Colonoscope was introduced through the anus and                         advanced to the the terminal ileum. The colonoscopy                         was performed without difficulty. The patient  tolerated the procedure well. The quality of the bowel                         preparation was good. Findings:      The perianal and digital rectal examinations were normal.      The terminal ileum appeared normal.      Non-bleeding internal hemorrhoids were found during retroflexion. The       hemorrhoids were Grade I (internal hemorrhoids that do not prolapse).      The exam was otherwise without abnormality on direct and retroflexion       views. Impression:            - The examined portion of the ileum was normal.                        - Non-bleeding internal hemorrhoids.                        - The examination was otherwise normal on direct and                          retroflexion views.                        - No specimens collected. Recommendation:        - Discharge patient to home.                        - Resume previous diet.                        - Continue present medications.                        - Repeat colonoscopy in 10 years for surveillance.                        - Return to referring physician as previously                         scheduled.                        - To visualize the small bowel, perform video capsule                         endoscopy if no improvement in iron studies after 4-6                         month trial of oral iron. Procedure Code(s):     --- Professional ---                        [phone], Colonoscopy, flexible; diagnostic, including                         collection of specimen(s) by brushing or washing, when                         performed (separate procedure) Diagnosis Code(s):     --- Professional ---  K64.0, First degree hemorrhoids                        D50.9, Iron deficiency anemia, unspecified CPT copyright 2019 American Medical Association. All rights reserved. The codes documented in this report are preliminary and upon coder review may  be revised to meet current compliance requirements. Andrey Farmer, MD Andrey Farmer MD, MD 10/14/2019 9:40:01 AM Number of Addenda: 0 Note Initiated On: 10/14/2019 8:44 AM Scope Withdrawal Time: 0 hours 7 minutes 42 seconds  Total Procedure Duration: 0 hours 20 minutes 44 seconds  Estimated Blood Loss:  Estimated blood loss: none.      [facility]

## 2019-10-14 NOTE — Op Note (Signed)
[facility] Gastroenterology Patient Name: Kerri Mccullough Procedure Date: 10/14/2019 8:45 AM MRN: [id number] Account #: [id number] Date of Birth: 12/22/1945 Admit Type: Outpatient Age: 74 Room: [facility] ENDO ROOM 2 Gender: Female Note Status: Finalized Procedure:             Upper GI endoscopy Indications:           Epigastric abdominal pain, Iron deficiency anemia,                         Dysphagia, Gastro-esophageal reflux disease Providers:             Andrey Farmer MD, MD Referring MD:          No Local Md, MD (Referring MD) Medicines:             Monitored Anesthesia Care Complications:         No immediate complications. Estimated blood loss:                         Minimal. Procedure:             Pre-Anesthesia Assessment:                        - Prior to the procedure, a History and Physical was                         performed, and patient medications and allergies were                         reviewed. The patient is competent. The risks and                         benefits of the procedure and the sedation options and                         risks were discussed with the patient. All questions                         were answered and informed consent was obtained.                         Patient identification and proposed procedure were                         verified by the physician, the nurse, the anesthetist                         and the technician in the endoscopy suite. Mental                         Status Examination: alert and oriented. Airway                         Examination: normal oropharyngeal airway and neck                         mobility. Respiratory Examination: clear to  auscultation. CV Examination: normal. Prophylactic                         Antibiotics: The patient does not require prophylactic                         antibiotics. Prior Anticoagulants: The patient has                         taken  no previous anticoagulant or antiplatelet                         agents. ASA Grade Assessment: II - A patient with mild                         systemic disease. After reviewing the risks and                         benefits, the patient was deemed in satisfactory                         condition to undergo the procedure. The anesthesia                         plan was to use monitored anesthesia care (MAC).                         Immediately prior to administration of medications,                         the patient was re-assessed for adequacy to receive                         sedatives. The heart rate, respiratory rate, oxygen                         saturations, blood pressure, adequacy of pulmonary                         ventilation, and response to care were monitored                         throughout the procedure. The physical status of the                         patient was re-assessed after the procedure.                        After obtaining informed consent, the endoscope was                         passed under direct vision. Throughout the procedure,                         the patient's blood pressure, pulse, and oxygen                         saturations were monitored continuously. The Endoscope  was introduced through the mouth, and advanced to the                         second part of duodenum. The upper GI endoscopy was                         accomplished without difficulty. The patient tolerated                         the procedure well. Findings:      White nummular lesions were noted in the entire esophagus. Brushings for       microbiology were obtained in the entire esophagus.      A small hiatal hernia was present.      The examined esophagus was otherwise normal. Biopsies were obtained from       the proximal and distal esophagus with cold forceps for histology of       suspected eosinophilic esophagitis. Estimated blood loss was  minimal.      Patchy moderate inflammation characterized by erythema was found in the       gastric antrum. Biopsies were taken with a cold forceps for Helicobacter       pylori testing. Estimated blood loss was minimal.      A few 5 mm sessile polyps with no stigmata of recent bleeding were found       in the gastric body. Two polyps were removed with a cold snare.       Resection and retrieval were complete. Estimated blood loss was minimal.       Estimate 2-3 polyps remaining.      The examined duodenum was normal. Impression:            - White nummular lesions in esophageal mucosa.                         Brushings performed.                        - Small hiatal hernia.                        - Normal esophagus. Biopsied.                        - Gastritis. Biopsied.                        - A few gastric polyps. Resected and retrieved.                        - Normal examined duodenum. Recommendation:        - Discharge patient to home.                        - Resume previous diet.                        - Continue present medications.                        - Await pathology results.                        -  Return to referring physician as previously                         scheduled. Procedure Code(s):     --- Professional ---                        [phone], Esophagogastroduodenoscopy, flexible,                         transoral; with removal of tumor(s), polyp(s), or                         other lesion(s) by snare technique Diagnosis Code(s):     --- Professional ---                        K22.8, Other specified diseases of esophagus                        K44.9, Diaphragmatic hernia without obstruction or                         gangrene                        K29.70, Gastritis, unspecified, without bleeding                        K31.7, Polyp of stomach and duodenum                        R10.13, Epigastric pain                        D50.9, Iron deficiency anemia,  unspecified                        R13.10, Dysphagia, unspecified                        K21.9, Gastro-esophageal reflux disease without                         esophagitis CPT copyright 2019 American Medical Association. All rights reserved. The codes documented in this report are preliminary and upon coder review may  be revised to meet current compliance requirements. Andrey Farmer, MD Andrey Farmer MD, MD 10/14/2019 9:35:52 AM Number of Addenda: 0 Note Initiated On: 10/14/2019 8:45 AM Estimated Blood Loss:  Estimated blood loss was minimal.      [facility]

## 2019-10-14 NOTE — H&P (Signed)
Outpatient short stay form Pre-procedure 10/14/2019 8:41 AM Raylene Miyamoto MD, MPH  Primary Physician:  PA Thurnell Garbe  Reason for visit:  IDA  History of present illness:   74 y/o lady with newly diagnosed IDA along with dysphagia here for EGD/Colonoscopy. No family history of GI malignancies. No blood thinners. History of hysterectomy many years ago.    Current Facility-Administered Medications:  .  0.9 %  sodium chloride infusion, , Intravenous, Continuous, Zedrick Springsteen, Hilton Cork, MD, Last Rate: 20 mL/hr at 10/14/19 0833, New Bag at 10/14/19 3559  Medications Prior to Admission  Medication Sig Dispense Refill Last Dose  . amLODipine (NORVASC) 5 MG tablet Take 5 mg by mouth 2 (two) times daily.   10/14/2019 at Unknown time  . aspirin EC 81 MG tablet Take 81 mg by mouth daily.   Past Week at Unknown time  . chlorthalidone (HYGROTON) 25 MG tablet Take 25 mg by mouth daily.   Past Week at Unknown time  . citalopram (CELEXA) 20 MG tablet Take 20 mg by mouth daily.   Past Week at Unknown time  . gabapentin (NEURONTIN) 100 MG capsule Take 300 mg by mouth Nightly.   Past Week at Unknown time  . losartan (COZAAR) 25 MG tablet Take 25 mg by mouth daily.   10/14/2019 at Unknown time  . metoprolol succinate (TOPROL-XL) 25 MG 24 hr tablet Take 25 mg by mouth daily.   10/14/2019 at Unknown time  . mirabegron ER (MYRBETRIQ) 50 MG TB24 tablet Take 50 mg by mouth daily.   Past Week at Unknown time  . omeprazole (PRILOSEC) 40 MG capsule Take 40 mg by mouth daily.   Past Week at Unknown time  . pravastatin (PRAVACHOL) 40 MG tablet Take 40 mg by mouth daily.   Past Week at Unknown time  . VICTOZA 18 MG/3ML SOPN Inject 0.6 mg into the skin daily.   Past Week at Unknown time  . nitroGLYCERIN (NITROSTAT) 0.4 MG SL tablet Place 1 tablet (0.4 mg total) under the tongue every 5 (five) minutes as needed for chest pain. 30 tablet 0   . sulfamethoxazole-trimethoprim (BACTRIM DS) 800-160 MG tablet Take 1 tablet by mouth 2  (two) times daily. 14 tablet 0      Allergies  Allergen Reactions  . Ace Inhibitors Swelling  . Rosiglitazone Other (See Comments)    Other Reaction: SOB, Pulmonary Edema Other Reaction: SOB, Pulmonary Edema Other Reaction: SOB, Pulmonary Edema   . Aspirin Swelling    When taken with a beta blocker   . Golytely [Peg 3350-Electrolytes] Swelling     Past Medical History:  Diagnosis Date  . Diabetes mellitus without complication ([place])   . GERD (gastroesophageal reflux disease)   . Heart attack ([place])   . High cholesterol   . Hypertension     Review of systems:  Otherwise negative.    Physical Exam  Gen: Alert, oriented. Appears stated age.  HEENT: [place]/AT. PERRLA. Lungs: no respiratory distress Abd: soft, benign, no masses. BS+ Ext: No edema. Pulses 2+    Planned procedures: Proceed with EGD/colonoscopy. The patient understands the nature of the planned procedure, indications, risks, alternatives and potential complications including but not limited to bleeding, infection, perforation, damage to internal organs and possible oversedation/side effects from anesthesia. The patient agrees and gives consent to proceed.  Please refer to procedure notes for findings, recommendations and patient disposition/instructions.     Raylene Miyamoto MD, MPH Gastroenterology 10/14/2019  8:41 AM

## 2019-10-14 NOTE — Anesthesia Preprocedure Evaluation (Signed)
Anesthesia Evaluation  Patient identified by MRN, date of birth, ID band Patient awake    Reviewed: Allergy & Precautions, H&P , NPO status , Patient's Chart, lab work & pertinent test results  History of Anesthesia Complications Negative for: history of anesthetic complications  Airway Mallampati: III  TM Distance: <3 FB Neck ROM: limited    Dental  (+) Chipped, Poor Dentition   Pulmonary neg pulmonary ROS, neg shortness of breath,    Pulmonary exam normal        Cardiovascular Exercise Tolerance: Good hypertension, (-) angina+ Past MI and + Cardiac Stents  (-) DOE Normal cardiovascular exam     Neuro/Psych negative neurological ROS  negative psych ROS   GI/Hepatic Neg liver ROS, GERD  Medicated and Controlled,  Endo/Other  diabetes, Type 2  Renal/GU negative Renal ROS  negative genitourinary   Musculoskeletal   Abdominal   Peds  Hematology negative hematology ROS (+)   Anesthesia Other Findings Past Medical History: No date: Diabetes mellitus without complication (HCC) No date: GERD (gastroesophageal reflux disease) No date: Heart attack ([place]) No date: High cholesterol No date: Hypertension  Past Surgical History: No date: ABDOMINAL HYSTERECTOMY No date: CORONARY ANGIOPLASTY WITH STENT PLACEMENT 05/07/2018: LEFT HEART CATH AND CORONARY ANGIOGRAPHY; N/A     Comment:  Procedure: LEFT HEART CATH AND CORONARY ANGIOGRAPHY with              possible PCI and stent;  Surgeon: Yolonda Kida, MD;              Location: [facility];  Service: Cardiovascular;              Laterality: N/A;  BMI    Body Mass Index: 29.95 kg/m      Reproductive/Obstetrics negative OB ROS                             Anesthesia Physical Anesthesia Plan  ASA: III  Anesthesia Plan: General   Post-op Pain Management:    Induction: Intravenous  PONV Risk Score and Plan: Propofol infusion  and TIVA  Airway Management Planned: Natural Airway and Nasal Cannula  Additional Equipment:   Intra-op Plan:   Post-operative Plan:   Informed Consent: I have reviewed the patients History and Physical, chart, labs and discussed the procedure including the risks, benefits and alternatives for the proposed anesthesia with the patient or authorized representative who has indicated his/her understanding and acceptance.     Dental Advisory Given  Plan Discussed with: Anesthesiologist, CRNA and Surgeon  Anesthesia Plan Comments: (Patient consented for risks of anesthesia including but not limited to:  - adverse reactions to medications - risk of intubation if required - damage to eyes, teeth, lips or other oral mucosa - nerve damage due to positioning  - sore throat or hoarseness - Damage to heart, brain, nerves, lungs, other parts of body or loss of life  Patient voiced understanding.)        Anesthesia Quick Evaluation

## 2019-10-14 NOTE — Transfer of Care (Signed)
Immediate Anesthesia Transfer of Care Note  Patient: Kerri Mccullough  Procedure(s) Performed: COLONOSCOPY WITH PROPOFOL (N/A ) ESOPHAGOGASTRODUODENOSCOPY (EGD) WITH PROPOFOL (N/A )  Patient Location: PACU and Endoscopy Unit  Anesthesia Type:General  Level of Consciousness: drowsy  Airway & Oxygen Therapy: Patient Spontanous Breathing and Patient connected to nasal cannula oxygen  Post-op Assessment: Report given to RN and Post -op Vital signs reviewed and stable  Post vital signs: Reviewed and stable  Last Vitals:  Vitals Value Taken Time  BP 101/59 10/14/19 0935  Temp 36.6 C 10/14/19 0930  Pulse 52 10/14/19 0935  Resp 20 10/14/19 0935  SpO2 91 % 10/14/19 0935    Last Pain:  Vitals:   10/14/19 0930  TempSrc: Temporal  PainSc:          Complications: No complications documented.

## 2019-10-14 NOTE — Anesthesia Postprocedure Evaluation (Signed)
Anesthesia Post Note  Patient: Kerri Mccullough  Procedure(s) Performed: COLONOSCOPY WITH PROPOFOL (N/A ) ESOPHAGOGASTRODUODENOSCOPY (EGD) WITH PROPOFOL (N/A )  Patient location during evaluation: Endoscopy Anesthesia Type: General Level of consciousness: awake and alert Pain management: pain level controlled Vital Signs Assessment: post-procedure vital signs reviewed and stable Respiratory status: spontaneous breathing, nonlabored ventilation, respiratory function stable and patient connected to nasal cannula oxygen Cardiovascular status: blood pressure returned to baseline and stable Postop Assessment: no apparent nausea or vomiting Anesthetic complications: no   No complications documented.   Last Vitals:  Vitals:   10/14/19 0950 10/14/19 1000  BP: 128/68 128/73  Pulse: (!) 58 (!) 55  Resp: 14 15  Temp:    SpO2: 98% 99%    Last Pain:  Vitals:   10/14/19 0930  TempSrc: Temporal  PainSc:                  Kerri Mccullough

## 2019-10-15 ENCOUNTER — Encounter: Payer: Self-pay | Admitting: Gastroenterology

## 2019-10-15 LAB — SURGICAL PATHOLOGY

## 2020-07-30 ENCOUNTER — Ambulatory Visit (INDEPENDENT_AMBULATORY_CARE_PROVIDER_SITE_OTHER): Payer: Medicare Other

## 2020-07-30 ENCOUNTER — Ambulatory Visit (INDEPENDENT_AMBULATORY_CARE_PROVIDER_SITE_OTHER): Payer: Medicare Other | Admitting: Podiatry

## 2020-07-30 ENCOUNTER — Encounter: Payer: Self-pay | Admitting: Podiatry

## 2020-07-30 ENCOUNTER — Other Ambulatory Visit: Payer: Self-pay

## 2020-07-30 DIAGNOSIS — M79675 Pain in left toe(s): Secondary | ICD-10-CM | POA: Diagnosis not present

## 2020-07-30 DIAGNOSIS — E1165 Type 2 diabetes mellitus with hyperglycemia: Secondary | ICD-10-CM

## 2020-07-30 DIAGNOSIS — B351 Tinea unguium: Secondary | ICD-10-CM | POA: Diagnosis not present

## 2020-07-30 DIAGNOSIS — M79674 Pain in right toe(s): Secondary | ICD-10-CM

## 2020-07-30 DIAGNOSIS — R52 Pain, unspecified: Secondary | ICD-10-CM

## 2020-07-30 DIAGNOSIS — M19072 Primary osteoarthritis, left ankle and foot: Secondary | ICD-10-CM | POA: Diagnosis not present

## 2020-07-30 NOTE — Progress Notes (Signed)
D

## 2020-08-04 NOTE — Progress Notes (Signed)
  Subjective:  Patient ID: Kerri Mccullough, female    DOB: 31-Mar-1945,  MRN: [id number]  Chief Complaint  Patient presents with   Foot Pain   Ankle Pain    Patient presents today for left ankle/foot pain and swelling x 2-3 weeks.   75 y.o. female returns for the above complaint.  Patient presents with thickened elongated dystrophic toenails x10.  She states is painful to touch.  She would like to have it debrided down.  She denies seeing anyone else prior to seeing me.  She also has occasional pain to the left foot and ankle that has been going on for past few days.  Is constant achy.  She does not want any kind of steroid injection and treatment options.  She is also diabetic that is uncontrolled with last A1c of 11.9  Objective:  There were no vitals filed for this visit. Podiatric Exam: Vascular: dorsalis pedis and posterior tibial pulses are palpable bilateral. Capillary return is immediate. Temperature gradient is WNL. Skin turgor WNL  Sensorium: Normal Semmes Weinstein monofilament test. Normal tactile sensation bilaterally. Nail Exam: Pt has thick disfigured discolored nails with subungual debris noted bilateral entire nail hallux through fifth toenails.  Pain on palpation to the nails. Ulcer Exam: There is no evidence of ulcer or pre-ulcerative changes or infection. Orthopedic Exam: Muscle tone and strength are WNL. No limitations in general ROM. No crepitus or effusions noted. HAV  B/L.  Hammer toes 2-5  B/L.  Mild pain on palpation to the left ankle joint deep intra-articular pain noted with range of motion of the ankle joint.  1+ pitting edema noted. Skin: No Porokeratosis. No infection or ulcers    Assessment & Plan:   1. Arthritis of ankle or foot, degenerative, left   2. Pain due to onychomycosis of toenails of both feet   3. Generalized pain   4. Uncontrolled type 2 diabetes mellitus with hyperglycemia ([place])     Patient was evaluated and treated and all questions  answered.  Left foot and ankle pain secondary to swelling with internal contusion -I explained to the patient etiology of swelling and various treatment options were extensively discussed.  Given the amount of pain that she is having is likely due to the internal contusion from swelling of the legs.  I discussed compression therapy as well as offloading.  She states understanding.  Onychomycosis with pain  -Nails palliatively debrided as below. -Educated on self-care  Procedure: Nail Debridement Rationale: pain  Type of Debridement: manual, sharp debridement. Instrumentation: Nail nipper, rotary burr. Number of Nails: 10  Procedures and Treatment: Consent by patient was obtained for treatment procedures. The patient understood the discussion of treatment and procedures well. All questions were answered thoroughly reviewed. Debridement of mycotic and hypertrophic toenails, 1 through 5 bilateral and clearing of subungual debris. No ulceration, no infection noted.  Return Visit-Office Procedure: Patient instructed to return to the office for a follow up visit 3 months for continued evaluation and treatment.  Boneta Lucks, DPM    No follow-ups on file.

## 2020-11-05 ENCOUNTER — Ambulatory Visit: Payer: Medicare Other | Admitting: Podiatry

## 2021-01-18 ENCOUNTER — Other Ambulatory Visit: Payer: Self-pay

## 2021-01-18 ENCOUNTER — Encounter: Payer: Self-pay | Admitting: Dermatology

## 2021-01-18 ENCOUNTER — Ambulatory Visit (INDEPENDENT_AMBULATORY_CARE_PROVIDER_SITE_OTHER): Payer: Medicare Other | Admitting: Dermatology

## 2021-01-18 DIAGNOSIS — L720 Epidermal cyst: Secondary | ICD-10-CM | POA: Diagnosis not present

## 2021-01-18 DIAGNOSIS — L820 Inflamed seborrheic keratosis: Secondary | ICD-10-CM | POA: Diagnosis not present

## 2021-01-18 DIAGNOSIS — L821 Other seborrheic keratosis: Secondary | ICD-10-CM

## 2021-01-18 DIAGNOSIS — D229 Melanocytic nevi, unspecified: Secondary | ICD-10-CM

## 2021-01-18 DIAGNOSIS — L578 Other skin changes due to chronic exposure to nonionizing radiation: Secondary | ICD-10-CM

## 2021-01-18 DIAGNOSIS — L570 Actinic keratosis: Secondary | ICD-10-CM

## 2021-01-18 DIAGNOSIS — Z85828 Personal history of other malignant neoplasm of skin: Secondary | ICD-10-CM | POA: Diagnosis not present

## 2021-01-18 NOTE — Patient Instructions (Signed)
Pre-Operative Instructions  You are scheduled for a surgical procedure at [facility]. We recommend you read the following instructions. If you have any questions or concerns, please call the office at [phone].  Shower and wash the entire body with soap and water the day of your surgery paying special attention to cleansing at and around the planned surgery site.  Avoid aspirin or aspirin containing products at least fourteen (14) days prior to your surgical procedure and for at least one week (7 Days) after your surgical procedure. If you take aspirin on a regular basis for heart disease or history of stroke or for any other reason, we may recommend you continue taking aspirin but please notify us if you take this on a regular basis. Aspirin can cause more bleeding to occur during surgery as well as prolonged bleeding and bruising after surgery.   Avoid other nonsteroidal pain medications at least one week prior to surgery and at least one week prior to your surgery. These include medications such as Ibuprofen (Motrin, Advil and Nuprin), Naprosyn, Voltaren, Relafen, etc. If medications are used for therapeutic reasons, please inform us as they can cause increased bleeding or prolonged bleeding during and bruising after surgical procedures.   Please advise [place] if you are taking any "blood thinner" medications such as Coumadin or Dipyridamole or Plavix or similar medications. These cause increased bleeding and prolonged bleeding during procedures and bruising after surgical procedures. We may have to consider discontinuing these medications briefly prior to and shortly after your surgery if safe to do so.   Please inform us of all medications you are currently taking. All medications that are taken regularly should be taken the day of surgery as you always do. Nevertheless, we need to be informed of what medications you are taking prior to surgery to know whether they will affect the  procedure or cause any complications.   Please inform us of any medication allergies. Also inform us of whether you have allergies to Latex or rubber products or whether you have had any adverse reaction to Lidocaine or Epinephrine.  Please inform us of any prosthetic or artificial body parts such as artificial heart valve, joint replacements, etc., or similar condition that might require preoperative antibiotics.   We recommend avoidance of alcohol at least two weeks prior to surgery and continued avoidance for at least two weeks after surgery.   We recommend discontinuation of tobacco smoking at least two weeks prior to surgery and continued abstinence for at least two weeks after surgery.  Do not plan strenuous exercise, strenuous work or strenuous lifting for approximately four weeks after your surgery.   We request if you are unable to make your scheduled surgical appointment, please call us at least a week in advance or as soon as you are aware of a problem so that we can cancel or reschedule the appointment.   You MAY TAKE TYLENOL (acetaminophen) for pain as it is not a blood thinner.   PLEASE PLAN TO BE IN TOWN FOR TWO WEEKS FOLLOWING SURGERY, THIS IS IMPORTANT SO YOU CAN BE CHECKED FOR DRESSING CHANGES, SUTURE REMOVAL AND TO MONITOR FOR POSSIBLE COMPLICATIONS.   If You Need Anything After Your Visit  If you have any questions or concerns for your doctor, please call our main line at [phone] and press option 4 to reach your doctor's medical assistant. If no one answers, please leave a voicemail as directed and we will return your call as soon as  possible. Messages left after 4 pm will be answered the following business day.   You may also send [place] a message via [place]. We typically respond to MyChart messages within 1-2 business days.  For prescription refills, please ask your pharmacy to contact our office. Our fax number is [phone].  If you have an urgent issue when the  clinic is closed that cannot wait until the next business day, you can page your doctor at the number below.    Please note that while we do our best to be available for urgent issues outside of office hours, we are not available 24/7.   If you have an urgent issue and are unable to reach [place], you may choose to seek medical care at your doctor's office, retail clinic, urgent care center, or emergency room.  If you have a medical emergency, please immediately call 911 or go to the emergency department.  Pager Numbers  - Dr. Nehemiah Massed: [phone]  - Dr. Laurence Ferrari: [phone]  - Dr. Nicole Kindred: [phone]  In the event of inclement weather, please call our main line at [phone] for an update on the status of any delays or closures.  Dermatology Medication Tips: Please keep the boxes that topical medications come in in order to help keep track of the instructions about where and how to use these. Pharmacies typically print the medication instructions only on the boxes and not directly on the medication tubes.   If your medication is too expensive, please contact our office at [phone] option 4 or send [place] a message through [place].   We are unable to tell what your co-pay for medications will be in advance as this is different depending on your insurance coverage. However, we may be able to find a substitute medication at lower cost or fill out paperwork to get insurance to cover a needed medication.   If a prior authorization is required to get your medication covered by your insurance company, please allow [place] 1-2 business days to complete this process.  Drug prices often vary depending on where the prescription is filled and some pharmacies may offer cheaper prices.  The website www.goodrx.com contains coupons for medications through different pharmacies. The prices here do not account for what the cost may be with help from insurance (it may be cheaper with your insurance), but the  website can give you the price if you did not use any insurance.  - You can print the associated coupon and take it with your prescription to the pharmacy.  - You may also stop by our office during regular business hours and pick up a GoodRx coupon card.  - If you need your prescription sent electronically to a different pharmacy, notify our office through [facility] or by phone at [phone] option 4.     Si Usted Necesita Algo Despus de Su Visita  Tambin puede enviarnos un mensaje a travs de Pharmacist, community. Por lo general respondemos a los mensajes de MyChart en el transcurso de 1 a 2 das hbiles.  Para renovar recetas, por favor pida a su farmacia que se ponga en contacto con nuestra oficina. Harland Dingwall de fax es [place] [phone].  Si tiene un asunto urgente cuando la clnica est cerrada y que no puede esperar hasta el siguiente da hbil, puede llamar/localizar a su doctor(a) al nmero que aparece a continuacin.   Por favor, tenga en cuenta que aunque hacemos todo lo posible para estar disponibles para asuntos urgentes fuera del horario de [place],  no estamos disponibles las 24 horas del da, los 7 das de la [place].   Si tiene un problema urgente y no puede comunicarse con nosotros, puede optar por buscar atencin mdica  en el consultorio de su doctor(a), en una clnica privada, en un centro de atencin urgente o en una sala de emergencias.  Si tiene Engineering geologist, por favor llame inmediatamente al 911 o vaya a la sala de emergencias.  Nmeros de bper  - Dr. Nehemiah Massed: [phone]  - Dra. Moye: [phone]  - Dra. Nicole Kindred: [phone]  En caso de inclemencias del [place], por favor llame a Johnsie Kindred principal al [phone] para una actualizacin sobre el [place] de cualquier retraso o cierre.  Consejos para la medicacin en dermatologa: Por favor, guarde las cajas en las que vienen los medicamentos de uso tpico para ayudarle a seguir las  instrucciones sobre dnde y cmo usarlos. Las farmacias generalmente imprimen las instrucciones del medicamento slo en las cajas y no directamente en los tubos del [place].   Si su medicamento es muy caro, por favor, pngase en contacto con Zigmund Daniel llamando al [phone] y presione la opcin 4 o envenos un mensaje a travs de Pharmacist, community.   No podemos decirle cul ser su copago por los medicamentos por adelantado ya que esto es diferente dependiendo de la cobertura de su seguro. Sin embargo, es posible que podamos encontrar un medicamento sustituto a Electrical engineer un formulario para que el seguro cubra el medicamento que se considera necesario.   Si se requiere una autorizacin previa para que su compaa de seguros [place] su medicamento, por favor permtanos de 1 a 2 das hbiles para completar este proceso.  Los precios de los medicamentos varan con frecuencia dependiendo del Environmental consultant de dnde se surte la receta y alguna farmacias pueden ofrecer precios ms baratos.  El sitio web www.goodrx.com tiene cupones para medicamentos de Airline pilot. Los precios aqu no tienen en cuenta lo que podra costar con la ayuda del seguro (puede ser ms barato con su seguro), pero el sitio web puede darle el precio si no utiliz Research scientist (physical sciences).  - Puede imprimir el cupn correspondiente y llevarlo con su receta a la farmacia.  - Tambin puede pasar por nuestra oficina durante el horario de atencin regular y Charity fundraiser una tarjeta de cupones de GoodRx.  - Si necesita que su receta se enve electrnicamente a una farmacia diferente, informe a nuestra oficina a travs de MyChart de [place] o por telfono llamando al [phone] y presione la opcin 4.

## 2021-01-18 NOTE — Progress Notes (Signed)
New Patient Visit  Subjective  Kerri Mccullough is a 75 y.o. female who presents for the following: Cysts (On the scalp - have been there for many years, she would like to discuss treatment options. ). The patient has spots, moles and lesions to be evaluated, some may be new or changing and the patient has concerns that these could be cancer.  The following portions of the chart were reviewed this encounter and updated as appropriate:   Tobacco  Allergies  Meds  Problems  Med Hx  Surg Hx  Fam Hx     Review of Systems:  No other skin or systemic complaints except as noted in HPI or Assessment and Plan.  Objective  Well appearing patient in no apparent distress; mood and affect are within normal limits.  A focused examination was performed including the scalp. Relevant physical exam findings are noted in the Assessment and Plan.  Ant vertex scalp, L post vertex, L post scalp 1.5 cm firm SQ nodule - ant vertex scalp  1.2 cm firm SQ nodule - L post vertex  1.2 cm firm SQ nodule - L post scalp    Face x 10 (10) Erythematous thin papules/macules with gritty scale.   Face x 4 (4) Erythematous keratotic or waxy stuck-on papule or plaque.    Assessment & Plan  Epidermal inclusion cyst Ant vertex scalp, L post vertex, L post scalp Benign-appearing. Exam most consistent with an epidermal inclusion cyst. Discussed that a cyst is a benign growth that can grow over time and sometimes get irritated or inflamed. Recommend observation if it is not bothersome. Discussed option of surgical excision to remove it if it is growing, symptomatic, or other changes noted. Please call for new or changing lesions so they can be evaluated.  AK (actinic keratosis) (10) Face x 10 Destruction of lesion - Face x 10 Complexity: simple   Destruction method: cryotherapy   Informed consent: discussed and consent obtained   Timeout:  patient name, date of birth, surgical site, and procedure  verified Lesion destroyed using liquid nitrogen: Yes   Region frozen until ice ball extended beyond lesion: Yes   Outcome: patient tolerated procedure well with no complications   Post-procedure details: wound care instructions given    Inflamed seborrheic keratosis Face x 4 Destruction of lesion - Face x 4 Complexity: simple   Destruction method: cryotherapy   Informed consent: discussed and consent obtained   Timeout:  patient name, date of birth, surgical site, and procedure verified Lesion destroyed using liquid nitrogen: Yes   Region frozen until ice ball extended beyond lesion: Yes   Outcome: patient tolerated procedure well with no complications   Post-procedure details: wound care instructions given    Actinic Damage - chronic, secondary to cumulative UV radiation exposure/sun exposure over time - diffuse scaly erythematous macules with underlying dyspigmentation - Recommend daily broad spectrum sunscreen SPF 30+ to sun-exposed areas, reapply every 2 hours as needed.  - Recommend staying in the shade or wearing long sleeves, sun glasses (UVA+UVB protection) and wide brim hats (4-inch brim around the entire circumference of the hat). - Call for new or changing lesions.  History of Squamous Cell Carcinoma of the Skin - No evidence of recurrence today - No lymphadenopathy - Recommend regular full body skin exams - Recommend daily broad spectrum sunscreen SPF 30+ to sun-exposed areas, reapply every 2 hours as needed.  - Call if any new or changing lesions are noted between office visits  Melanocytic Nevi - Tan-brown and/or pink-flesh-colored symmetric macules and papules - Benign appearing on exam today - Observation - Call clinic for new or changing moles - Recommend daily use of broad spectrum spf 30+ sunscreen to sun-exposed areas.   Seborrheic Keratoses - Stuck-on, waxy, tan-brown papules and/or plaques  - Benign-appearing - Discussed benign etiology and  prognosis. - Observe - Call for any changes  Return for surgery - cyst excision.  Luther Redo, CMA, am acting as scribe for Sarina Ser, MD . Documentation: I have reviewed the above documentation for accuracy and completeness, and I agree with the above.  Sarina Ser, MD

## 2021-01-27 ENCOUNTER — Encounter: Payer: Self-pay | Admitting: Dermatology

## 2021-02-16 ENCOUNTER — Encounter: Payer: Medicare Other | Admitting: Dermatology

## 2021-03-15 ENCOUNTER — Ambulatory Visit: Payer: Self-pay | Admitting: Urology

## 2021-04-06 ENCOUNTER — Encounter: Payer: Medicare Other | Admitting: Dermatology

## 2021-04-20 ENCOUNTER — Other Ambulatory Visit: Payer: Self-pay

## 2021-04-20 ENCOUNTER — Ambulatory Visit (INDEPENDENT_AMBULATORY_CARE_PROVIDER_SITE_OTHER): Payer: Medicare Other | Admitting: Dermatology

## 2021-04-20 DIAGNOSIS — L7211 Pilar cyst: Secondary | ICD-10-CM

## 2021-04-20 DIAGNOSIS — D485 Neoplasm of uncertain behavior of skin: Secondary | ICD-10-CM

## 2021-04-20 MED ORDER — MUPIROCIN 2 % EX OINT: 1.0000 "application " | TOPICAL_OINTMENT | Freq: Every day | CUTANEOUS | 0 refills | Status: DC

## 2021-04-20 NOTE — Patient Instructions (Signed)
Wound Care Instructions  On the day following your surgery, you should begin doing daily dressing changes: Remove the old dressing and discard it. Cleanse the wound gently with tap water. This may be done in the shower or by placing a wet gauze pad directly on the wound and letting it soak for several minutes. It is important to gently remove any dried blood from the wound in order to encourage healing. This may be done by gently rolling a moistened Q-tip on the dried blood. Do not pick at the wound. If the wound should start to bleed, continue cleaning the wound, then place a moist gauze pad on the wound and hold pressure for a few minutes.  Make sure you then dry the skin surrounding the wound completely or the tape will not stick to the skin. Do not use cotton balls on the wound. After the wound is clean and dry, apply the ointment gently with a Q-tip. Cut a non-stick pad to fit the size of the wound. Lay the pad flush to the wound. If the wound is draining, you may want to reinforce it with a small amount of gauze on top of the non-stick pad for a little added compression to the area. Use the tape to seal the area completely. Select from the following with respect to your individual situation: If your wound has been stitched closed: continue the above steps 1-8 at least daily until your sutures are removed. If your wound has been left open to heal: continue steps 1-8 at least daily for the first 3-4 weeks. We would like for you to take a few extra precautions for at least the next week. Sleep with your head elevated on pillows if our wound is on your head. Do not bend over or lift heavy items to reduce the chance of elevated blood pressure to the wound Do not participate in particularly strenuous activities.   Below is a list of dressing supplies you might need.  Cotton-tipped applicators - Q-tips Gauze pads (2x2 and/or 4x4) - All-Purpose Sponges Non-stick dressing material - Telfa Tape -  Paper or Hypafix New and clean tube of petroleum jelly - Vaseline    Comments on Post-Operative Period Slight swelling and redness often appear around the wound. This is normal and will disappear within several days following the surgery. The healing wound will drain a brownish-red-yellow discharge during healing. This is a normal phase of wound healing. As the wound begins to heal, the drainage may increase in amount. Again, this drainage is normal. Notify us if the drainage becomes persistently bloody, excessively swollen, or intensely painful or develops a foul odor or red streaks.  If you should experience mild discomfort during the healing phase, you may take an aspirin-free medication such as Tylenol (acetaminophen). Notify us if the discomfort is severe or persistent. Avoid alcoholic beverages when taking pain medicine.  In Case of Wound Hemorrhage A wound hemorrhage is when the bandage suddenly becomes soaked with bright red blood and flows profusely. If this happens, sit down or lie down with your head elevated. If the wound has a dressing on it, do not remove the dressing. Apply pressure to the existing gauze. If the wound is not covered, use a gauze pad to apply pressure and continue applying the pressure for 20 minutes without peeking. DO NOT COVER THE WOUND WITH A LARGE TOWEL OR [place] CLOTH. Release your hand from the wound site but do not remove the dressing. If the bleeding has stopped,  gently clean around the wound. Leave the dressing in place for 24 hours if possible. This wait time allows the blood vessels to close off so that you do not spark a new round of bleeding by disrupting the newly clotted blood vessels with an immediate dressing change. If the bleeding does not subside, continue to hold pressure. If matters are out of your control, contact an After Hours clinic or go to the Emergency Room.    If You Need Anything After Your Visit  If you have any questions or concerns for  your doctor, please call our main line at [phone] and press option 4 to reach your doctor's medical assistant. If no one answers, please leave a voicemail as directed and we will return your call as soon as possible. Messages left after 4 pm will be answered the following business day.   You may also send [place] a message via [place]. We typically respond to MyChart messages within 1-2 business days.  For prescription refills, please ask your pharmacy to contact our office. Our fax number is [phone].  If you have an urgent issue when the clinic is closed that cannot wait until the next business day, you can page your doctor at the number below.    Please note that while we do our best to be available for urgent issues outside of office hours, we are not available 24/7.   If you have an urgent issue and are unable to reach [place], you may choose to seek medical care at your doctor's office, retail clinic, urgent care center, or emergency room.  If you have a medical emergency, please immediately call 911 or go to the emergency department.  Pager Numbers  - Dr. Nehemiah Massed: [phone]  - Dr. Laurence Ferrari: [phone]  - Dr. Nicole Kindred: [phone]  In the event of inclement weather, please call our main line at [phone] for an update on the status of any delays or closures.  Dermatology Medication Tips: Please keep the boxes that topical medications come in in order to help keep track of the instructions about where and how to use these. Pharmacies typically print the medication instructions only on the boxes and not directly on the medication tubes.   If your medication is too expensive, please contact our office at [phone] option 4 or send [place] a message through [place].   We are unable to tell what your co-pay for medications will be in advance as this is different depending on your insurance coverage. However, we may be able to find a substitute medication at lower cost or fill out  paperwork to get insurance to cover a needed medication.   If a prior authorization is required to get your medication covered by your insurance company, please allow [place] 1-2 business days to complete this process.  Drug prices often vary depending on where the prescription is filled and some pharmacies may offer cheaper prices.  The website www.goodrx.com contains coupons for medications through different pharmacies. The prices here do not account for what the cost may be with help from insurance (it may be cheaper with your insurance), but the website can give you the price if you did not use any insurance.  - You can print the associated coupon and take it with your prescription to the pharmacy.  - You may also stop by our office during regular business hours and pick up a GoodRx coupon card.  - If you need your prescription sent electronically to a different pharmacy, notify our  office through [facility] or by phone at [phone] option 4.     Si Usted Necesita Algo Despus de Su Visita  Tambin puede enviarnos un mensaje a travs de Pharmacist, community. Por lo general respondemos a los mensajes de MyChart en el transcurso de 1 a 2 das hbiles.  Para renovar recetas, por favor pida a su farmacia que se ponga en contacto con nuestra oficina. Harland Dingwall de fax es [place] [phone].  Si tiene un asunto urgente cuando la clnica est cerrada y que no puede esperar hasta el siguiente da hbil, puede llamar/localizar a su doctor(a) al nmero que aparece a continuacin.   Por favor, tenga en cuenta que aunque hacemos todo lo posible para estar disponibles para asuntos urgentes fuera del horario de [place], no estamos disponibles las 24 horas del da, los 7 das de la [place].   Si tiene un problema urgente y no puede comunicarse con nosotros, puede optar por buscar atencin mdica  en el consultorio de su doctor(a), en una clnica privada, en un centro de atencin urgente o en una sala de  emergencias.  Si tiene Engineering geologist, por favor llame inmediatamente al 911 o vaya a la sala de emergencias.  Nmeros de bper  - Dr. Nehemiah Massed: [phone]  - Dra. Moye: [phone]  - Dra. Nicole Kindred: [phone]  En caso de inclemencias del [place], por favor llame a Johnsie Kindred principal al [phone] para una actualizacin sobre el [place] de cualquier retraso o cierre.  Consejos para la medicacin en dermatologa: Por favor, guarde las cajas en las que vienen los medicamentos de uso tpico para ayudarle a seguir las instrucciones sobre dnde y cmo usarlos. Las farmacias generalmente imprimen las instrucciones del medicamento slo en las cajas y no directamente en los tubos del [place].   Si su medicamento es muy caro, por favor, pngase en contacto con Zigmund Daniel llamando al [phone] y presione la opcin 4 o envenos un mensaje a travs de Pharmacist, community.   No podemos decirle cul ser su copago por los medicamentos por adelantado ya que esto es diferente dependiendo de la cobertura de su seguro. Sin embargo, es posible que podamos encontrar un medicamento sustituto a Electrical engineer un formulario para que el seguro cubra el medicamento que se considera necesario.   Si se requiere una autorizacin previa para que su compaa de seguros [place] su medicamento, por favor permtanos de 1 a 2 das hbiles para completar este proceso.  Los precios de los medicamentos varan con frecuencia dependiendo del Environmental consultant de dnde se surte la receta y alguna farmacias pueden ofrecer precios ms baratos.  El sitio web www.goodrx.com tiene cupones para medicamentos de Airline pilot. Los precios aqu no tienen en cuenta lo que podra costar con la ayuda del seguro (puede ser ms barato con su seguro), pero el sitio web puede darle el precio si no utiliz Research scientist (physical sciences).  - Puede imprimir el cupn correspondiente y llevarlo con su receta a la farmacia.  - Tambin puede pasar por  nuestra oficina durante el horario de atencin regular y Charity fundraiser una tarjeta de cupones de GoodRx.  - Si necesita que su receta se enve electrnicamente a una farmacia diferente, informe a nuestra oficina a travs de MyChart de [place] o por telfono llamando al [phone] y presione la opcin 4.

## 2021-04-20 NOTE — Progress Notes (Signed)
? ?  Follow-Up Visit ?  ?Subjective  ?Kerri Mccullough is a 76 y.o. female who presents for the following: Cyst (Of scalp - Excise today). ? ?The following portions of the chart were reviewed this encounter and updated as appropriate:  ? Tobacco  Allergies  Meds  Problems  Med Hx  Surg Hx  Fam Hx   ?  ?Review of Systems:  No other skin or systemic complaints except as noted in HPI or Assessment and Plan. ? ?Objective  ?Well appearing patient in no apparent distress; mood and affect are within normal limits. ? ?A focused examination was performed including scalp. Relevant physical exam findings are noted in the Assessment and Plan. ? ?Left crown ?Cystic papule 2.5 cm ? ? ?Assessment & Plan  ?Neoplasm of uncertain behavior of skin ?Left crown ? ?Skin excision ? ?Lesion length (cm):  2.5 ?Lesion width (cm):  2.5 ?Margin per side (cm):  0 ?Total excision diameter (cm):  2.5 ?Informed consent: discussed and consent obtained   ?Timeout: patient name, date of birth, surgical site, and procedure verified   ?Procedure prep:  Patient was prepped and draped in usual sterile fashion ?Prep type:  Isopropyl alcohol and povidone-iodine ?Anesthesia: the lesion was anesthetized in a standard fashion   ?Anesthetic:  1% lidocaine w/ epinephrine 1-100,000 buffered w/ 8.4% NaHCO3 ?Instrument used: #15 blade   ?Hemostasis achieved with: pressure   ?Hemostasis achieved with comment:  Electrocautery ?Outcome: patient tolerated procedure well with no complications   ?Post-procedure details: sterile dressing applied and wound care instructions given   ?Dressing type: bandage and pressure dressing (mupirocin)   ? ?Skin repair ?Complexity:  Complex ?Final length (cm):  2.5 ?Reason for type of repair: reduce tension to allow closure, reduce the risk of dehiscence, infection, and necrosis, reduce subcutaneous dead space and avoid a hematoma, allow closure of the large defect, preserve normal anatomy, preserve normal anatomical and  functional relationships and enhance both functionality and cosmetic results   ?Undermining: area extensively undermined   ?Undermining comment:  Undermining defect 2.5cm ?Subcutaneous layers (deep stitches):  ?Suture size:  4-0 ?Suture type: Vicryl (polyglactin 910)   ?Subcutaneous suture technique: inverted dermal. ?Fine/surface layer approximation (top stitches):  ?Suture size:  3-0 ?Suture type: nylon   ?Stitches: simple running   ?Suture removal (days):  7 ?Hemostasis achieved with: suture and pressure ?Outcome: patient tolerated procedure well with no complications   ?Post-procedure details: sterile dressing applied and wound care instructions given   ?Dressing type: bandage and pressure dressing (mupirocin)   ? ?mupirocin ointment (BACTROBAN) 2 % ?Apply 1 application. topically daily. With dressing changes ? ?Specimen 1 - Surgical pathology ?Differential Diagnosis: Cyst vs other ?Check Margins: No ? ? ?Return in about 1 week (around 04/27/2021) for suture removal. ? ?I, Ashok Cordia, CMA, am acting as scribe for Sarina Ser, MD . ?Documentation: I have reviewed the above documentation for accuracy and completeness, and I agree with the above. ? ?Sarina Ser, MD ? ?

## 2021-04-21 ENCOUNTER — Telehealth: Payer: Self-pay

## 2021-04-21 NOTE — Telephone Encounter (Signed)
Tried to call patient regarding surgery - no answer/hd ?

## 2021-04-25 ENCOUNTER — Encounter: Payer: Self-pay | Admitting: Dermatology

## 2021-04-27 ENCOUNTER — Ambulatory Visit (INDEPENDENT_AMBULATORY_CARE_PROVIDER_SITE_OTHER): Payer: Medicare Other | Admitting: Dermatology

## 2021-04-27 ENCOUNTER — Other Ambulatory Visit: Payer: Self-pay

## 2021-04-27 DIAGNOSIS — I8312 Varicose veins of left lower extremity with inflammation: Secondary | ICD-10-CM

## 2021-04-27 DIAGNOSIS — L905 Scar conditions and fibrosis of skin: Secondary | ICD-10-CM

## 2021-04-27 MED ORDER — MOMETASONE FUROATE 0.1 % EX CREA: TOPICAL_CREAM | CUTANEOUS | 1 refills | Status: DC

## 2021-04-27 NOTE — Patient Instructions (Signed)

## 2021-04-27 NOTE — Progress Notes (Signed)
? ?  Follow-Up Visit ?  ?Subjective  ?Kerri Mccullough is a 76 y.o. female who presents for the following: Suture / Staple Removal (1 week f/u suture removal left crown biopsy proven pilar cyst ) and Skin Problem (Check red painful spot on the left leg x 3 months). ? ?The following portions of the chart were reviewed this encounter and updated as appropriate:  ? Tobacco  Allergies  Meds  Problems  Med Hx  Surg Hx  Fam Hx   ?  ?Review of Systems:  No other skin or systemic complaints except as noted in HPI or Assessment and Plan. ? ?Objective  ?Well appearing patient in no apparent distress; mood and affect are within normal limits. ? ?A focused examination was performed including scalp. Relevant physical exam findings are noted in the Assessment and Plan. ? ?left crown ?Well healed scar  ? ?left mid lateral pretibial ?3.5 cm Pink scaly indurated patch  ? ? ? ? ? ? ? ?Assessment & Plan  ?Scar ?left crown ? ?Discussed biopsy results pilar cyst  ? ?Encounter for Removal of Sutures ?- Incision site at the left crown is clean, dry and intact ?- Wound cleansed, sutures removed, wound cleansed and steri strips applied.  ?- Discussed pathology results showing Pilar cyst   ?- Patient advised to keep steri-strips dry until they fall off. ?- Scars remodel for a full year. ?- Once steri-strips fall off, patient can apply over-the-counter silicone scar cream each night to help with scar remodeling if desired. ?- Patient advised to call with any concerns or if they notice any new or changing lesions.  ? ?Lipodermatosclerosis of left lower extremity ?left mid lateral pretibial ? ?Lipodermatosclerosis is a chronic inflammatory condition of unknown cause of the subcutaneous fat causing tenderness, discoloration and hardening of the involved skin, most commonly on the lower legs. Discussed that it may progress and gradually worsen over time, especially in the setting of chronic leg swelling. Daily compression stockings/hose  is recommended.  ? ?Start Mometasone cream apply to affected skin once a day 5 days a week  ? ?Apply graduated compression knee high stocking daily  ? ?Related Medications ?mometasone (ELOCON) 0.1 % cream ?Apply to affected leg 5 nights a week ? ? ?Return in about 3 months (around 07/28/2021) for lipodermatosclerosis . ? ?I, Marye Round, CMA, am acting as scribe for Sarina Ser, MD .  ?Documentation: I have reviewed the above documentation for accuracy and completeness, and I agree with the above. ? ?Sarina Ser, MD ? ?

## 2021-05-06 ENCOUNTER — Encounter: Payer: Self-pay | Admitting: Dermatology

## 2021-07-18 ENCOUNTER — Emergency Department: Payer: Medicare Other

## 2021-07-18 ENCOUNTER — Other Ambulatory Visit: Payer: Self-pay

## 2021-07-18 ENCOUNTER — Observation Stay
Admission: EM | Admit: 2021-07-18 | Discharge: 2021-07-20 | Disposition: A | Discharge status: Home / Self Care | Payer: Medicare Other | Attending: Internal Medicine | Admitting: Internal Medicine

## 2021-07-18 DIAGNOSIS — Z85828 Personal history of other malignant neoplasm of skin: Secondary | ICD-10-CM | POA: Diagnosis not present

## 2021-07-18 DIAGNOSIS — W2209XA Striking against other stationary object, initial encounter: Secondary | ICD-10-CM | POA: Insufficient documentation

## 2021-07-18 DIAGNOSIS — R296 Repeated falls: Secondary | ICD-10-CM | POA: Diagnosis present

## 2021-07-18 DIAGNOSIS — K8689 Other specified diseases of pancreas: Secondary | ICD-10-CM | POA: Diagnosis present

## 2021-07-18 DIAGNOSIS — E119 Type 2 diabetes mellitus without complications: Secondary | ICD-10-CM

## 2021-07-18 DIAGNOSIS — Y92254 Theater (live) as the place of occurrence of the external cause: Secondary | ICD-10-CM | POA: Diagnosis not present

## 2021-07-18 DIAGNOSIS — M545 Low back pain, unspecified: Principal | ICD-10-CM | POA: Insufficient documentation

## 2021-07-18 DIAGNOSIS — I10 Essential (primary) hypertension: Secondary | ICD-10-CM | POA: Diagnosis not present

## 2021-07-18 DIAGNOSIS — I2510 Atherosclerotic heart disease of native coronary artery without angina pectoris: Secondary | ICD-10-CM | POA: Diagnosis not present

## 2021-07-18 DIAGNOSIS — M25551 Pain in right hip: Secondary | ICD-10-CM

## 2021-07-18 DIAGNOSIS — Z955 Presence of coronary angioplasty implant and graft: Secondary | ICD-10-CM | POA: Diagnosis not present

## 2021-07-18 DIAGNOSIS — Z7984 Long term (current) use of oral hypoglycemic drugs: Secondary | ICD-10-CM | POA: Diagnosis not present

## 2021-07-18 DIAGNOSIS — R102 Pelvic and perineal pain: Secondary | ICD-10-CM | POA: Diagnosis not present

## 2021-07-18 DIAGNOSIS — Z79899 Other long term (current) drug therapy: Secondary | ICD-10-CM | POA: Insufficient documentation

## 2021-07-18 DIAGNOSIS — R52 Pain, unspecified: Secondary | ICD-10-CM | POA: Diagnosis present

## 2021-07-18 DIAGNOSIS — J9601 Acute respiratory failure with hypoxia: Secondary | ICD-10-CM | POA: Diagnosis not present

## 2021-07-18 DIAGNOSIS — Z7982 Long term (current) use of aspirin: Secondary | ICD-10-CM | POA: Insufficient documentation

## 2021-07-18 DIAGNOSIS — E785 Hyperlipidemia, unspecified: Secondary | ICD-10-CM | POA: Diagnosis present

## 2021-07-18 DIAGNOSIS — F32A Depression, unspecified: Secondary | ICD-10-CM | POA: Diagnosis present

## 2021-07-18 DIAGNOSIS — W19XXXA Unspecified fall, initial encounter: Secondary | ICD-10-CM

## 2021-07-18 DIAGNOSIS — K219 Gastro-esophageal reflux disease without esophagitis: Secondary | ICD-10-CM | POA: Diagnosis present

## 2021-07-18 DIAGNOSIS — R42 Dizziness and giddiness: Secondary | ICD-10-CM | POA: Diagnosis present

## 2021-07-18 LAB — BASIC METABOLIC PANEL
Anion gap: 7 (ref 5–15)
BUN: 17 mg/dL (ref 8–23)
CO2: 28 mmol/L (ref 22–32)
Calcium: 9.4 mg/dL (ref 8.9–10.3)
Chloride: 100 mmol/L (ref 98–111)
Creatinine, Ser: 1.01 mg/dL — ABNORMAL HIGH (ref 0.44–1.00)
GFR, Estimated: 58 mL/min — ABNORMAL LOW (ref >60)
Glucose, Bld: 134 mg/dL — ABNORMAL HIGH (ref 70–99)
Potassium: 3.7 mmol/L (ref 3.5–5.1)
Sodium: 135 mmol/L (ref 135–145)

## 2021-07-18 LAB — CBC
HCT: 36.6 % (ref 36.0–46.0)
Hemoglobin: 10.8 g/dL — ABNORMAL LOW (ref 12.0–15.0)
MCH: 24.2 pg — ABNORMAL LOW (ref 26.0–34.0)
MCHC: 29.5 g/dL — ABNORMAL LOW (ref 30.0–36.0)
MCV: 81.9 fL (ref 80.0–100.0)
Platelets: 252 10*3/uL (ref 150–400)
RBC: 4.47 MIL/uL (ref 3.87–5.11)
RDW: 17.4 % — ABNORMAL HIGH (ref 11.5–15.5)
WBC: 7.8 10*3/uL (ref 4.0–10.5)
nRBC: 0 % (ref 0.0–0.2)

## 2021-07-18 LAB — CBG MONITORING, ED: Glucose-Capillary: 206 mg/dL — ABNORMAL HIGH (ref 70–99)

## 2021-07-18 LAB — TROPONIN I (HIGH SENSITIVITY): Troponin I (High Sensitivity): 10 ng/L (ref <18)

## 2021-07-18 MED ORDER — FENTANYL CITRATE PF 50 MCG/ML IJ SOSY
50.0000 ug | PREFILLED_SYRINGE | Freq: Once | INTRAMUSCULAR | Status: AC
  Administered 2021-07-18: 50 ug via INTRAVENOUS
  Filled 2021-07-18: qty 1

## 2021-07-18 MED ORDER — SODIUM CHLORIDE 0.9 % IV SOLN: Freq: Once | INTRAVENOUS | Status: AC

## 2021-07-18 MED ORDER — HYDROCODONE-ACETAMINOPHEN 5-325 MG PO TABS
1.0000 | ORAL_TABLET | Freq: Once | ORAL | Status: AC
  Administered 2021-07-18: 1 via ORAL
  Filled 2021-07-18: qty 1

## 2021-07-18 MED ORDER — IOHEXOL 300 MG/ML  SOLN
100.0000 mL | Freq: Once | INTRAMUSCULAR | Status: AC | PRN
Start: 2021-07-18 — End: 2021-07-18
  Administered 2021-07-18: 100 mL via INTRAVENOUS

## 2021-07-18 MED ORDER — ONDANSETRON HCL 4 MG/2ML IJ SOLN
4.0000 mg | Freq: Once | INTRAMUSCULAR | Status: AC
  Administered 2021-07-18: 4 mg via INTRAVENOUS
  Filled 2021-07-18: qty 2

## 2021-07-18 NOTE — ED Notes (Signed)
Pt given saltines, peanut butter, diet gingerale per her request.

## 2021-07-18 NOTE — ED Triage Notes (Signed)
[place] with c/o dizziness and fall. Pt states she was standing to take picture and became dizzy. Pt denies any loc or blood thinners. Pt did hit head on wall.  Pt c/o lower back pain.  BP-77/35 HR-56 SR O2-94% RA  20 g IV in right hand

## 2021-07-18 NOTE — ED Notes (Signed)
After pt was given pain meds O2 sat dropped to 87% on room air. 2L [place] applied and O2 sat was 98%.

## 2021-07-18 NOTE — ED Provider Notes (Signed)
[facility] Provider Note    Event Date/Time   First MD Initiated Contact with Patient 07/18/21 1536     (approximate)   History   Right low back pain   HPI  Kerri Mccullough is a 76 y.o. female  who presents to the emergency department today because of concern for pain after a fall.  The patient states that she was at the movie theater when she was going to take a picture with a cardboard cut out.  She thought it was can be more stable so when she put her hand out to balance on it and it was not she fell.  She denied any chest pain palpitations dizziness or lightheadedness prior to the fall she says she did hit her head on the wall and low back.  Was not able to get up and walk.  She denies any change in sensation to her leg.  Denies any recent illness.  Physical Exam   Triage Vital Signs: ED Triage Vitals  Enc Vitals Group     BP 07/18/21 1451 105/62     Pulse Rate 07/18/21 1451 (!) 56     Resp 07/18/21 1451 (!) 21     Temp 07/18/21 1451 (!) 97.4 F (36.3 C)     Temp src --      SpO2 07/18/21 1451 94 %     Weight --      Height --      Head Circumference --      Peak Flow --      Pain Score 07/18/21 1450 8   Most recent vital signs: Vitals:   07/18/21 1451  BP: 105/62  Pulse: (!) 56  Resp: (!) 21  Temp: (!) 97.4 F (36.3 C)  SpO2: 94%    General: Awake, alert, oriented. CV:  Good peripheral perfusion. Regular rate and rhythm. Resp:  Normal effort. Lungs clear Abd:  No distention.  Other:  Tender to palpation to the lumbar spine and right iliac crest. DP 2+ in right leg.    ED Results / Procedures / Treatments   Labs (all labs ordered are listed, but only abnormal results are displayed) Labs Reviewed  BASIC METABOLIC PANEL - Abnormal; Notable for the following components:      Result Value   Glucose, Bld 134 (*)    Creatinine, Ser 1.01 (*)    GFR, Estimated 58 (*)    All other components within normal limits  CBC - Abnormal;  Notable for the following components:   Hemoglobin 10.8 (*)    MCH 24.2 (*)    MCHC 29.5 (*)    RDW 17.4 (*)    All other components within normal limits  URINALYSIS, ROUTINE W REFLEX MICROSCOPIC  CBG MONITORING, ED     EKG  I, Nance Pear, attending physician, personally viewed and interpreted this EKG  EKG Time: 1451 Rate: 61 Rhythm: sinus rhythm Axis: normal Intervals: qtc 444 QRS: narrow, q waves v1 ST changes: no st elevation Impression: abnormal ekg   RADIOLOGY I independently interpreted and visualized the CT head. My interpretation: No bleed Radiology interpretation:    IMPRESSION:  No acute intracranial abnormality.     Mild cerebral and cerebellar atrophy and chronic small vessel  disease.       I independently interpreted and visualized the lumbar spine. My interpretation: No acute osseous abnormality Radiology interpretation:    IMPRESSION:  1. No evidence of acute abnormality.  2. Unchanged 75% L1 compression  fracture.   I independently interpreted and visualized the Right hip. My interpretation: No acute osseous abnormality Radiology interpretation:  IMPRESSION:  Negative.      I independently interpreted and visualized the ct pelvis. My interpretation: No acute osseous abnormality Radiology interpretation:  IMPRESSION:  No recent fracture is seen in the pelvis with attention to the right  hip. There is no evidence of any free fluid in the pelvic cavity.  There is no bowel wall thickening. Urinary bladder is unremarkable.     Degenerative changes are noted in the pubic symphysis and lower  lumbar spine. Few diverticula are seen in the colon. Atherosclerotic  changes are noted.     Other findings as described in the body of the report.          PROCEDURES:  Critical Care performed: No  Procedures   MEDICATIONS ORDERED IN ED: Medications - No data to display   IMPRESSION / MDM / [place] / ED COURSE  I reviewed  the triage vital signs and the nursing notes.                              Differential diagnosis includes, but is not limited to, fracture, dislocation, contusion.  Patient's presentation is most consistent with acute presentation with potential threat to life or bodily function.  Patient presented to the emergency department today with primary concern for right low back and pelvic pain after a fall.  Patient described a mechanical fall to myself.  She denied any dizziness chest pain or palpitations.  She was quite tender to the right pelvis and low back.  Initial x-rays did not show any acute osseous abnormality thus more advanced imaging was obtained.  This again did not show any acute osseous abnormality.  However even with narcotic pain medication patient was not able to ambulate.  After pain medication she did develop some hypoxia and nausea which I do think were related to the pain medication.  She was able to maintain good oxygen on 2 L after the pain medication.  At this time given that the patient required multiple doses of IV pain medication is unable to ambulate will discuss with the hospitalist provider for admission.  FINAL CLINICAL IMPRESSION(S) / ED DIAGNOSES   Final diagnoses:  Pain, joint, pelvic region, right  Fall, initial encounter     Note:  This document was prepared using Dragon voice recognition software and may include unintentional dictation errors.    Nance Pear, MD 07/18/21 [phone]

## 2021-07-19 ENCOUNTER — Observation Stay: Payer: Medicare Other

## 2021-07-19 ENCOUNTER — Encounter: Payer: Self-pay | Admitting: Radiology

## 2021-07-19 DIAGNOSIS — I2510 Atherosclerotic heart disease of native coronary artery without angina pectoris: Secondary | ICD-10-CM | POA: Diagnosis not present

## 2021-07-19 DIAGNOSIS — F32A Depression, unspecified: Secondary | ICD-10-CM | POA: Diagnosis not present

## 2021-07-19 DIAGNOSIS — M545 Low back pain, unspecified: Secondary | ICD-10-CM | POA: Diagnosis not present

## 2021-07-19 DIAGNOSIS — I10 Essential (primary) hypertension: Secondary | ICD-10-CM

## 2021-07-19 DIAGNOSIS — R52 Pain, unspecified: Secondary | ICD-10-CM

## 2021-07-19 DIAGNOSIS — K8689 Other specified diseases of pancreas: Secondary | ICD-10-CM

## 2021-07-19 DIAGNOSIS — E785 Hyperlipidemia, unspecified: Secondary | ICD-10-CM

## 2021-07-19 DIAGNOSIS — J9601 Acute respiratory failure with hypoxia: Secondary | ICD-10-CM

## 2021-07-19 DIAGNOSIS — W19XXXA Unspecified fall, initial encounter: Secondary | ICD-10-CM

## 2021-07-19 DIAGNOSIS — E1165 Type 2 diabetes mellitus with hyperglycemia: Secondary | ICD-10-CM

## 2021-07-19 LAB — CBC WITH DIFFERENTIAL/PLATELET
Abs Immature Granulocytes: 0.04 10*3/uL (ref 0.00–0.07)
Basophils Absolute: 0.1 10*3/uL (ref 0.0–0.1)
Basophils Relative: 1 %
Eosinophils Absolute: 0.2 10*3/uL (ref 0.0–0.5)
Eosinophils Relative: 2 %
HCT: 34.9 % — ABNORMAL LOW (ref 36.0–46.0)
Hemoglobin: 10.5 g/dL — ABNORMAL LOW (ref 12.0–15.0)
Immature Granulocytes: 0 %
Lymphocytes Relative: 21 %
Lymphs Abs: 2.1 10*3/uL (ref 0.7–4.0)
MCH: 24.3 pg — ABNORMAL LOW (ref 26.0–34.0)
MCHC: 30.1 g/dL (ref 30.0–36.0)
MCV: 80.8 fL (ref 80.0–100.0)
Monocytes Absolute: 0.9 10*3/uL (ref 0.1–1.0)
Monocytes Relative: 9 %
Neutro Abs: 6.7 10*3/uL (ref 1.7–7.7)
Neutrophils Relative %: 67 %
Platelets: 215 10*3/uL (ref 150–400)
RBC: 4.32 MIL/uL (ref 3.87–5.11)
RDW: 17.5 % — ABNORMAL HIGH (ref 11.5–15.5)
WBC: 10 10*3/uL (ref 4.0–10.5)
nRBC: 0 % (ref 0.0–0.2)

## 2021-07-19 LAB — COMPREHENSIVE METABOLIC PANEL
ALT: 16 U/L (ref 0–44)
AST: 19 U/L (ref 15–41)
Albumin: 3.4 g/dL — ABNORMAL LOW (ref 3.5–5.0)
Alkaline Phosphatase: 47 U/L (ref 38–126)
Anion gap: 12 (ref 5–15)
BUN: 15 mg/dL (ref 8–23)
CO2: 28 mmol/L (ref 22–32)
Calcium: 9 mg/dL (ref 8.9–10.3)
Chloride: 97 mmol/L — ABNORMAL LOW (ref 98–111)
Creatinine, Ser: 0.98 mg/dL (ref 0.44–1.00)
GFR, Estimated: >60 mL/min (ref >60)
Glucose, Bld: 155 mg/dL — ABNORMAL HIGH (ref 70–99)
Potassium: 3.3 mmol/L — ABNORMAL LOW (ref 3.5–5.1)
Sodium: 137 mmol/L (ref 135–145)
Total Bilirubin: 0.6 mg/dL (ref 0.3–1.2)
Total Protein: 7.4 g/dL (ref 6.5–8.1)

## 2021-07-19 LAB — HEMOGLOBIN A1C
Hgb A1c MFr Bld: 8.5 % — ABNORMAL HIGH (ref 4.8–5.6)
Mean Plasma Glucose: 197.25 mg/dL

## 2021-07-19 LAB — GLUCOSE, CAPILLARY
Glucose-Capillary: 155 mg/dL — ABNORMAL HIGH (ref 70–99)
Glucose-Capillary: 276 mg/dL — ABNORMAL HIGH (ref 70–99)
Glucose-Capillary: 199 mg/dL — ABNORMAL HIGH (ref 70–99)
Glucose-Capillary: 136 mg/dL — ABNORMAL HIGH (ref 70–99)

## 2021-07-19 LAB — MAGNESIUM: Magnesium: 2.1 mg/dL (ref 1.7–2.4)

## 2021-07-19 MED ORDER — HEPARIN SODIUM (PORCINE) 5000 UNIT/ML IJ SOLN
5000.0000 [IU] | Freq: Three times a day (TID) | INTRAMUSCULAR | Status: DC
  Administered 2021-07-19 – 2021-07-20 (×5): 5000 [IU] via SUBCUTANEOUS
  Filled 2021-07-19 (×5): qty 1

## 2021-07-19 MED ORDER — ATORVASTATIN CALCIUM 20 MG PO TABS
40.0000 mg | ORAL_TABLET | Freq: Every day | ORAL | Status: DC
  Administered 2021-07-19 – 2021-07-20 (×2): 40 mg via ORAL
  Filled 2021-07-19 (×2): qty 2

## 2021-07-19 MED ORDER — GADOBUTROL 1 MMOL/ML IV SOLN
8.0000 mL | Freq: Once | INTRAVENOUS | Status: AC | PRN
  Administered 2021-07-19: 8 mL via INTRAVENOUS

## 2021-07-19 MED ORDER — MORPHINE SULFATE (PF) 2 MG/ML IV SOLN
2.0000 mg | INTRAVENOUS | Status: DC | PRN
  Administered 2021-07-19: 2 mg via INTRAVENOUS
  Filled 2021-07-19: qty 1

## 2021-07-19 MED ORDER — PANTOPRAZOLE SODIUM 40 MG PO TBEC
40.0000 mg | DELAYED_RELEASE_TABLET | Freq: Every day | ORAL | Status: DC
  Administered 2021-07-19 – 2021-07-20 (×2): 40 mg via ORAL
  Filled 2021-07-19 (×2): qty 1

## 2021-07-19 MED ORDER — OXYCODONE HCL 5 MG PO TABS
5.0000 mg | ORAL_TABLET | ORAL | Status: DC | PRN
  Administered 2021-07-19 (×2): 5 mg via ORAL
  Filled 2021-07-19 (×3): qty 1

## 2021-07-19 MED ORDER — ACETAMINOPHEN 325 MG PO TABS
650.0000 mg | ORAL_TABLET | Freq: Four times a day (QID) | ORAL | Status: DC | PRN
  Administered 2021-07-20: 650 mg via ORAL
  Filled 2021-07-19: qty 2

## 2021-07-19 MED ORDER — ONDANSETRON HCL 4 MG PO TABS: 4.0000 mg | ORAL_TABLET | Freq: Four times a day (QID) | ORAL | Status: DC | PRN

## 2021-07-19 MED ORDER — CARVEDILOL 6.25 MG PO TABS
6.2500 mg | ORAL_TABLET | Freq: Two times a day (BID) | ORAL | Status: DC
  Administered 2021-07-19 – 2021-07-20 (×3): 6.25 mg via ORAL
  Filled 2021-07-19 (×3): qty 1

## 2021-07-19 MED ORDER — SENNOSIDES-DOCUSATE SODIUM 8.6-50 MG PO TABS: 1.0000 | ORAL_TABLET | Freq: Every evening | ORAL | Status: DC | PRN

## 2021-07-19 MED ORDER — ONDANSETRON HCL 4 MG/2ML IJ SOLN
4.0000 mg | Freq: Four times a day (QID) | INTRAMUSCULAR | Status: DC | PRN
  Administered 2021-07-19: 4 mg via INTRAVENOUS
  Filled 2021-07-19: qty 2

## 2021-07-19 MED ORDER — CITALOPRAM HYDROBROMIDE 20 MG PO TABS
20.0000 mg | ORAL_TABLET | Freq: Every day | ORAL | Status: DC
  Administered 2021-07-19 – 2021-07-20 (×2): 20 mg via ORAL
  Filled 2021-07-19 (×2): qty 1

## 2021-07-19 MED ORDER — ASPIRIN 81 MG PO TBEC
81.0000 mg | DELAYED_RELEASE_TABLET | Freq: Every day | ORAL | Status: DC
  Administered 2021-07-19 – 2021-07-20 (×2): 81 mg via ORAL
  Filled 2021-07-19 (×2): qty 1

## 2021-07-19 MED ORDER — INSULIN ASPART 100 UNIT/ML IJ SOLN
0.0000 [IU] | Freq: Three times a day (TID) | INTRAMUSCULAR | Status: DC
  Administered 2021-07-19: 8 [IU] via SUBCUTANEOUS
  Administered 2021-07-19 – 2021-07-20 (×4): 3 [IU] via SUBCUTANEOUS
  Filled 2021-07-19 (×7): qty 1

## 2021-07-19 MED ORDER — ACETAMINOPHEN 650 MG RE SUPP: 650.0000 mg | Freq: Four times a day (QID) | RECTAL | Status: DC | PRN

## 2021-07-19 MED ORDER — POTASSIUM CHLORIDE CRYS ER 20 MEQ PO TBCR
40.0000 meq | EXTENDED_RELEASE_TABLET | Freq: Once | ORAL | Status: AC
Start: 2021-07-19 — End: 2021-07-19
  Administered 2021-07-19: 40 meq via ORAL
  Filled 2021-07-19: qty 2

## 2021-07-19 MED ORDER — GABAPENTIN 100 MG PO CAPS
300.0000 mg | ORAL_CAPSULE | Freq: Three times a day (TID) | ORAL | Status: DC
  Administered 2021-07-19: 100 mg via ORAL
  Administered 2021-07-19 – 2021-07-20 (×4): 300 mg via ORAL
  Filled 2021-07-19 (×5): qty 3

## 2021-07-19 NOTE — Progress Notes (Addendum)
Inpatient Diabetes Program Recommendations  AACE/ADA: New Consensus Statement on Inpatient Glycemic Control   Target Ranges:  Prepandial:   less than 140 mg/dL      Peak postprandial:   less than 180 mg/dL (1-2 hours)      Critically ill patients:  140 - 180 mg/dL    Latest Reference Range & Units 07/18/21 21:45 07/19/21 07:58 07/19/21 12:17  Glucose-Capillary 70 - 99 mg/dL 206 (H) 155 (H) 276 (H)   Review of Glycemic Control  Diabetes history: DM2 Outpatient Diabetes medications: Glipizide XL 5 mg daily, Jardiance 25 mg daily, Metformin 1000 mg BID, Victoza 0.6 mg daily Current orders for Inpatient glycemic control: Novolog 0-15 units TID with meals  Inpatient Diabetes Program Recommendations:    Insulin: Please consider ordering Novolog 4 units TID with meals for meal coverage if patient eats at least 50% of meals.  Outpatient DM medication: Given pancreatic mass noted on CT, may want to consider discontinuing Victoza outpatient.  Thanks, Barnie Alderman, RN, MSN, [place] Diabetes Coordinator Inpatient Diabetes Program [phone] (Team Pager from 8am to [place])

## 2021-07-19 NOTE — Assessment & Plan Note (Signed)
Restart Jardiance, Glucotrol, metformin, and Victoza for discharge -Sliding scale coverage -Monitor CBGs -Last A1c was uncontrolled at 11.5 - deferred to PCP.

## 2021-07-19 NOTE — Evaluation (Signed)
Occupational Therapy Evaluation Patient Details Name: Kerri Mccullough MRN: [id number] DOB: Sep 29, 1945 Today's Date: 07/19/2021   History of Present Illness Pt is a 76 y.o. female s/p fall at movie theater (pt put her hand out to balance on cardboard cut out and fell); c/o R low back and pelvic pain; unable to ambulate; became nauseas later.  Imaging did not show any acute osseous abnormality.  Pt admitted with intractable pain, acute respiratory failure with hypoxia, pancreatic mass, and fall.  PMH includes DM, CAD, htn.   Clinical Impression   Pt was seen for OT evaluation this date. Prior to hospital admission, pt was generally independent, living alone bu has 24/7 assist from family. Currently pt demonstrates impairments as described below (See OT problem list) which functionally limit her ability to perform ADL/self-care tasks. Pt currently requires supv - Min A for bed mobility, MIN A for ADL transfers with VC for hand placement, and at least MIN A for LB ADL tasks 2/2 back pain, impaired balance, and decr activity tolerance impacting her mobility, bathing, dressing, and toileting. Pt educated in PLB to support breath recovery and minimize SOB with exertional activity. Pt able to ambulate short in-room distance with RW and CGA to [facility] with recliner chair follow from family member. Pt fatigued afterwards, returned to bed. SpO2 90-92% on 2L O2. Pt would benefit from skilled OT services to address noted impairments and functional limitations (see below for any additional details) in order to maximize safety and independence while minimizing falls risk and caregiver burden. Upon hospital discharge, recommend HHOT to maximize pt safety and return to functional independence during meaningful occupations of daily life.     Recommendations for follow up therapy are one component of a multi-disciplinary discharge planning process, led by the attending physician.  Recommendations may be updated based on  patient status, additional functional criteria and insurance authorization.   Follow Up Recommendations  Home health OT    Assistance Recommended at Discharge Frequent or constant Supervision/Assistance  Patient can return home with the following A little help with walking and/or transfers;A lot of help with bathing/dressing/bathroom;Assistance with cooking/housework;Assist for transportation;Direct supervision/assist for medications management;Help with stairs or ramp for entrance    Functional Status Assessment  Patient has had a recent decline in their functional status and demonstrates the ability to make significant improvements in function in a reasonable and predictable amount of time.  Equipment Recommendations  Other (comment) (2WW)    Recommendations for Other Services       Precautions / Restrictions Precautions Precautions: Fall Restrictions Weight Bearing Restrictions: No      Mobility Bed Mobility Overal bed mobility: Needs Assistance Bed Mobility: Supine to Sit, Rolling, Sit to Supine Rolling: Supervision   Supine to sit: Min guard, HOB elevated Sit to supine: Min assist   General bed mobility comments: increased time, educated in log roll to minimize pain, MIN A for BLE mgt back to bed    Transfers Overall transfer level: Needs assistance Equipment used: Rolling walker (2 wheels) Transfers: Sit to/from Stand Sit to Stand: Min assist           General transfer comment: VC for hand placement      Balance Overall balance assessment: Needs assistance Sitting-balance support: No upper extremity supported, Feet supported Sitting balance-Leahy Scale: Good     Standing balance support: Bilateral upper extremity supported, During functional activity, Single extremity supported Standing balance-Leahy Scale: Fair Standing balance comment: Fair, able to stand with UE support on RW  and complete pericare wiht CGA in standing                            ADL either performed or assessed with clinical judgement   ADL Overall ADL's : Needs assistance/impaired                                       General ADL Comments: Pt required CGA for clothing mgt during toileting, set up and CGA in standing for pericare, CGA-MIN A for ADL transfers with VC for hand placement, and at least MIN A for LB ADL 2/2 back pain     Vision         Perception     Praxis      Pertinent Vitals/Pain Pain Assessment Pain Assessment: 0-10 Pain Score: 7  Pain Location: across low back to B hips Pain Descriptors / Indicators: Aching, Sore Pain Intervention(s): Limited activity within patient's tolerance, Monitored during session, Repositioned, Patient requesting pain meds-RN notified     Hand Dominance     Extremity/Trunk Assessment Upper Extremity Assessment Upper Extremity Assessment: Overall WFL for tasks assessed   Lower Extremity Assessment Lower Extremity Assessment: Overall WFL for tasks assessed   Cervical / Trunk Assessment Cervical / Trunk Assessment: Normal   Communication Communication Communication: No difficulties   Cognition Arousal/Alertness: Suspect due to medications (still a bit drowsy and slow to respond) Behavior During Therapy: WFL for tasks assessed/performed Overall Cognitive Status: Within Functional Limits for tasks assessed                                       General Comments  SpO2 90%-92% on 2L    Exercises Other Exercises Other Exercises: Pt educated in pursed lip breathing to support breath recovery during ADL tasks, RW mgt, ADL transfers   Shoulder Instructions      Home Living Family/patient expects to be discharged to:: Private residence Living Arrangements: Alone Available Help at Discharge: Family;Available 24 hours/day Type of Home: House Home Access: Ramped entrance     Home Layout: One level     Bathroom Shower/Tub: Tub/shower unit;Tub only   Armed forces operational officer: Standard (with toilet riser)     Home Equipment: BSC/3in1          Prior Functioning/Environment Prior Level of Function : Independent/Modified Independent             Mobility Comments: No other recent falls.          OT Problem List: Pain;Decreased activity tolerance;Impaired balance (sitting and/or standing);Decreased knowledge of use of DME or AE      OT Treatment/Interventions: Self-care/ADL training;Therapeutic exercise;Therapeutic activities;DME and/or AE instruction;Patient/family education;Balance training;Energy conservation    OT Goals(Current goals can be found in the care plan section) Acute Rehab OT Goals Patient Stated Goal: go home OT Goal Formulation: With patient/family Time For Goal Achievement: 08/02/21 Potential to Achieve Goals: Good ADL Goals Pt Will Perform Lower Body Dressing: with adaptive equipment;sit to/from stand;with min guard assist Pt Will Transfer to Toilet: with supervision;ambulating;bedside commode (BSC over toilet, LRAD for amb) Pt Will Perform Toileting - Clothing Manipulation and hygiene: sitting/lateral leans;with modified independence Additional ADL Goal #1: Pt will implement learned strategies to improve ADL/mobility while minimizing back pain, 3/3 opportunities wiht VC to  initiate.  OT Frequency: Min 2X/week    Co-evaluation              AM-PAC OT "6 Clicks" Daily Activity     Outcome Measure Help from another person eating meals?: None Help from another person taking care of personal grooming?: A Little Help from another person toileting, which includes using toliet, bedpan, or urinal?: A Little Help from another person bathing (including washing, rinsing, drying)?: A Little Help from another person to put on and taking off regular upper body clothing?: A Little Help from another person to put on and taking off regular lower body clothing?: A Little 6 Click Score: 19   End of Session Equipment Utilized During  Treatment: Gait belt;Oxygen;Rolling walker (2 wheels) Nurse Communication: Patient requests pain meds  Activity Tolerance: Patient tolerated treatment well Patient left: in bed;with call bell/phone within reach;with bed alarm set;with family/visitor present  OT Visit Diagnosis: Other abnormalities of gait and mobility (R26.89);Pain Pain - part of body:  (back)                Time: 2395-3202 OT Time Calculation (min): 26 min Charges:  OT General Charges $OT Visit: 1 Visit OT Evaluation $OT Eval Moderate Complexity: 1 Mod OT Treatments $Self Care/Home Management : 8-22 mins  Ardeth Perfect., MPH, MS, OTR/L ascom [phone] 07/19/21, 3:53 PM

## 2021-07-19 NOTE — Progress Notes (Addendum)
The patient is a 76 yr old woman who presented to [facility] ED on 07/18/2021 via EMS after a fall in a movie theater with complaints of lower back pain and head pain. She was unable to get up on her own after the fall.  In the ED the patient was found to have no acute intracranial abnormality on the CT head. It did show mild cerebral and cerebellar atrophy and chronic small vessel disease. Imager of the L-spine demonstrated no evidence of acute abnormality and unchanged 75% compression fracture of the L1. X-ray of the right hip was also negative for fracture. CT pelvis was also negative except for degenerative changes are noted in the pubic symphysis and L-spine.   The patient continued to complain of severe pain. She continued to have decreased oxygen saturations. This was thought to be due to her opiate medications. 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 MRCP is pending.   The patient was admitted to a medical bed by my colleague, Dr. Clearence Ped early this morning.  I have seen and examined this patient myself. I have reviewed her H&P, labs, and vitals. I have reviewed her imagery.

## 2021-07-19 NOTE — Assessment & Plan Note (Signed)
-   Patient required oxygen supplementation after both doses of fentanyl -Oxygen sats dropped to low 90s/high 80s -No other respiratory symptoms -Thought to be completely related to the opiate medication --The patient has been ambulated on room air. O2 saturations dropped to 89%. She tolerated ambulation well. She does not meet criteria for home O2.

## 2021-07-19 NOTE — Assessment & Plan Note (Signed)
The patient has remained normotensive without restarting norvasc and chlorthalidone. Will defer resumption of these medications to PCP.  Coreg has been continued in the setting of CAD

## 2021-07-19 NOTE — Assessment & Plan Note (Signed)
Continue PPI ?

## 2021-07-19 NOTE — Assessment & Plan Note (Signed)
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 I discussed the patient with Dr. Allen Norris. He recommended only follow up with GI as outpatient.

## 2021-07-19 NOTE — Evaluation (Signed)
Physical Therapy Evaluation Patient Details Name: Kerri Mccullough MRN: [id number] DOB: 1945/04/03 Today's Date: 07/19/2021  History of Present Illness  Pt is a 76 y.o. female s/p fall at movie theater (pt put her hand out to balance on cardboard cut out and fell); c/o R low back and pelvic pain; unable to ambulate; became nauseas later.  Imaging did not show any acute osseous abnormality.  Pt admitted with intractable pain, acute respiratory failure with hypoxia, pancreatic mass, and fall.  PMH includes DM, CAD, htn.  Clinical Impression  Prior to hospital admission, pt was independent with functional mobility; lives alone in 1 level home with ramp to enter; has 24/7 family support per pt's oldest daughter.  Currently pt is modified independent semi-supine to sitting edge of bed; min assist with transfers; and CGA to min assist to ambulate 60 feet with RW use.  Pt appearing drowsy during session (slow to respond/initiate movement intermittently)--pt reports anticipate d/t recent pain meds.  Pt also noted with intermittent posterior lean requiring assist for balance in standing (appears to be d/t pt's drowsiness).  Pt would benefit from skilled PT to address noted impairments and functional limitations (see below for any additional details).  Upon hospital discharge, pt would benefit from [place] and 24/7 assist (pt's oldest daughter reports they have assist required for safe home discharge).    Recommendations for follow up therapy are one component of a multi-disciplinary discharge planning process, led by the attending physician.  Recommendations may be updated based on patient status, additional functional criteria and insurance authorization.  Follow Up Recommendations Home health PT    Assistance Recommended at Discharge Frequent or constant Supervision/Assistance  Patient can return home with the following  A little help with walking and/or transfers;A little help with  bathing/dressing/bathroom;Assistance with cooking/housework;Assist for transportation;Help with stairs or ramp for entrance    Equipment Recommendations Rolling walker (2 wheels)  Recommendations for Other Services  OT consult    Functional Status Assessment Patient has had a recent decline in their functional status and demonstrates the ability to make significant improvements in function in a reasonable and predictable amount of time.     Precautions / Restrictions Precautions Precautions: Fall Restrictions Weight Bearing Restrictions: No      Mobility  Bed Mobility Overal bed mobility: Modified Independent             General bed mobility comments: Semi-supine to sitting edge of bed with mild increased effort to perform on own; HOB elevated    Transfers Overall transfer level: Needs assistance Equipment used: Rolling walker (2 wheels) Transfers: Sit to/from Stand Sit to Stand: Min assist           General transfer comment: increased effort to stand from bed x1 trial and from recliner x1 trial; assist for balance d/t posterior lean when standing; vc's for UE placement    Ambulation/Gait Ambulation/Gait assistance: Min guard, Min assist Gait Distance (Feet): 60 Feet Assistive device: Rolling walker (2 wheels)   Gait velocity: decreased     General Gait Details: partial step through gait pattern; intermittently taking steps (pt appearing drowsy and not quite alert at times)  Science writer    Modified Rankin (Stroke Patients Only)       Balance Overall balance assessment: Needs assistance Sitting-balance support: No upper extremity supported, Feet supported Sitting balance-Leahy Scale: Good Sitting balance - Comments: steady sitting reaching within BOS   Standing balance  support: Bilateral upper extremity supported, During functional activity Standing balance-Leahy Scale: Poor Standing balance comment: intermittent  posterior lean in standing activities with RW use (pt appearing drowsy intermittently)--min assist for balance at times                             Pertinent Vitals/Pain Pain Assessment Pain Assessment: 0-10 Pain Score: 0-No pain (5/10 with ambulation; 0/10 at rest) Pain Location: across low back to B hips Pain Descriptors / Indicators: Aching, Sore Pain Intervention(s): Limited activity within patient's tolerance, Monitored during session, Premedicated before session, Repositioned Vitals (HR and O2 on 2 L via nasal cannula) stable and WFL throughout treatment session.    Home Living Family/patient expects to be discharged to:: Private residence Living Arrangements: Alone Available Help at Discharge: Family;Available 24 hours/day Type of Home: House Home Access: Ramped entrance       Home Layout: One level Home Equipment: BSC/3in1      Prior Function Prior Level of Function : Independent/Modified Independent             Mobility Comments: No other recent falls.       Hand Dominance        Extremity/Trunk Assessment   Upper Extremity Assessment Upper Extremity Assessment: Overall WFL for tasks assessed    Lower Extremity Assessment Lower Extremity Assessment: Overall WFL for tasks assessed    Cervical / Trunk Assessment Cervical / Trunk Assessment: Normal  Communication   Communication: No difficulties  Cognition Arousal/Alertness: Suspect due to medications (pt appearing drowsy and slow to respond during session) Behavior During Therapy: WFL for tasks assessed/performed Overall Cognitive Status: Within Functional Limits for tasks assessed                                          General Comments  Nursing cleared pt for participation in physical therapy.  Pt agreeable to PT session.  Pt's daughter and granddaughter present during session.    Exercises     Assessment/Plan    PT Assessment Patient needs continued PT  services  PT Problem List Decreased strength;Decreased activity tolerance;Decreased balance;Decreased mobility;Decreased knowledge of use of DME;Pain       PT Treatment Interventions DME instruction;Gait training;Functional mobility training;Therapeutic activities;Therapeutic exercise;Balance training;Patient/family education    PT Goals (Current goals can be found in the Care Plan section)  Acute Rehab PT Goals Patient Stated Goal: to improve pain and go home PT Goal Formulation: With patient/family Time For Goal Achievement: 08/02/21 Potential to Achieve Goals: Good    Frequency Min 2X/week     Co-evaluation               AM-PAC PT "6 Clicks" Mobility  Outcome Measure Help needed turning from your back to your side while in a flat bed without using bedrails?: None Help needed moving from lying on your back to sitting on the side of a flat bed without using bedrails?: None Help needed moving to and from a bed to a chair (including a wheelchair)?: A Little Help needed standing up from a chair using your arms (e.g., wheelchair or bedside chair)?: A Little Help needed to walk in hospital room?: A Little Help needed climbing 3-5 steps with a railing? : A Little 6 Click Score: 20    End of Session Equipment Utilized During Treatment: Gait belt;Oxygen (2 L  via nasal cannula) Activity Tolerance: Other (comment) (limited d/t drowsiness) Patient left: in chair;with call bell/phone within reach;with chair alarm set;with family/visitor present Nurse Communication: Mobility status;Precautions;Other (comment) (pt's drowsiness during session) PT Visit Diagnosis: Unsteadiness on feet (R26.81);Other abnormalities of gait and mobility (R26.89);Muscle weakness (generalized) (M62.81);History of falling (Z91.81)    Time: 0931-1216 PT Time Calculation (min) (ACUTE ONLY): 54 min   Charges:   PT Evaluation $PT Eval Low Complexity: 1 Low PT Treatments $Gait Training: 8-22  mins $Therapeutic Activity: 8-22 mins       Leitha Bleak, PT 07/19/21, 12:16 PM

## 2021-07-19 NOTE — H&P (Signed)
History and Physical    Patient: Kerri Mccullough PNT:[id number] DOB: 08/07/1945 DOA: 07/18/2021 DOS: the patient was seen and examined on 07/19/2021 PCP: Rutherford Limerick, PA  Patient coming from: Home  Chief Complaint:  Chief Complaint  Patient presents with   Dizziness   Fall   HPI: Kerri Mccullough is a 76 y.o. female with medical history significant of diabetes mellitus type 2 uncontrolled, GERD, CAD, high cholesterol, hypertension, and previous falls presents the ED with a chief complaint of fall.  Patient reports she was at the movie theater when she went to take a picture with a cardboard cut out of [place].  She leaned against the counter thinking that it would hold her up, but it fell away.  Patient fell with it, hitting her head on the wall, and landing with the stand further cut out across her back.  Patient reports no preceding symptoms to the fall including no dizziness, no palpitations, no shortness of breath, no chest pain.  She reports after the fall feeling immediate severe pain.  She had a headache in the back of her head.  But her worst pain was in her lower back from her right hip all the way across to her left hip.  Patient describes the pain as sharp and constant.  Pain medications offered almost no relief.  Patient was not able to stand up on scene.  She reports when she sat up at the scene she did feel dizzy.  In the ED every time she tried to sit on the edge of the bed she became nauseous and started throwing up.  Her emesis was the color of bile per the daughter, without blood.  She also lost control of her bowels when the times that she was throwing up.  For this reason she was not ambulated in the ED.  Daughter thinks that this pain is causing her nausea.  Patient denies feeling dizzy upon these attempts to stand in the ER.  Patient reports that up until this fall she is in her normal state of health, preparing to have a good day with shopping and movies with her daughter.   Patient has no other complaints at this time.  Patient does not smoke, does not drink, does not use illicit drugs.  She is not vaccinated for COVID.  She is DNR. Review of Systems: As mentioned in the history of present illness. All other systems reviewed and are negative. Past Medical History:  Diagnosis Date   Actinic keratosis    Diabetes mellitus without complication (HCC)    GERD (gastroesophageal reflux disease)    Heart attack ([place])    High cholesterol    Hypertension    Squamous cell carcinoma of skin    R cheek - [facility] at [facility]   Past Surgical History:  Procedure Laterality Date   ABDOMINAL HYSTERECTOMY     COLONOSCOPY WITH PROPOFOL N/A 10/14/2019   Procedure: COLONOSCOPY WITH PROPOFOL;  Surgeon: Lesly Rubenstein, MD;  Location: [facility] ENDOSCOPY;  Service: Endoscopy;  Laterality: N/A;   CORONARY ANGIOPLASTY WITH STENT PLACEMENT     ESOPHAGOGASTRODUODENOSCOPY (EGD) WITH PROPOFOL N/A 10/14/2019   Procedure: ESOPHAGOGASTRODUODENOSCOPY (EGD) WITH PROPOFOL;  Surgeon: Lesly Rubenstein, MD;  Location: [facility] ENDOSCOPY;  Service: Endoscopy;  Laterality: N/A;   LEFT HEART CATH AND CORONARY ANGIOGRAPHY N/A 05/07/2018   Procedure: LEFT HEART CATH AND CORONARY ANGIOGRAPHY with possible PCI and stent;  Surgeon: Yolonda Kida, MD;  Location: [facility];  Service:  Cardiovascular;  Laterality: N/A;   Social History:  reports that she has never smoked. She has never used smokeless tobacco. She reports that she does not drink alcohol and does not use drugs.  Allergies  Allergen Reactions   Ace Inhibitors Swelling   Rosiglitazone Other (See Comments)    Other Reaction: SOB, Pulmonary Edema Other Reaction: SOB, Pulmonary Edema Other Reaction: SOB, Pulmonary Edema    Aspirin Swelling    When taken with a beta blocker    Golytely [Peg 3350-Electrolytes] Swelling    History reviewed. No pertinent family history.  Prior to Admission medications   Medication Sig Start Date  End Date Taking? Authorizing Provider  alendronate (FOSAMAX) 70 MG tablet Take 1 tablet by mouth once a week. 01/28/19  Yes [provider]  amLODipine (NORVASC) 5 MG tablet Take 5 mg by mouth 2 (two) times daily. 04/05/18  Yes [provider]  aspirin EC 81 MG tablet Take 81 mg by mouth daily. 11/09/11  Yes [provider]  atorvastatin (LIPITOR) 20 MG tablet Take 20 mg by mouth daily. 06/28/21  Yes [provider]  chlorthalidone (HYGROTON) 25 MG tablet Take 25 mg by mouth daily. 04/05/18  Yes [provider]  citalopram (CELEXA) 20 MG tablet Take 20 mg by mouth daily. 04/05/18  Yes [provider]  gabapentin (NEURONTIN) 300 MG capsule Take 300 mg by mouth 3 (three) times daily. 04/16/21  Yes [provider]  glipiZIDE (GLUCOTROL XL) 5 MG 24 hr tablet Take 5 mg by mouth daily. 06/19/21  Yes [provider]  JARDIANCE 25 MG TABS tablet Take 25 mg by mouth daily. 07/02/21  Yes [provider]  losartan (COZAAR) 25 MG tablet Take 25 mg by mouth daily. 04/05/18  Yes [provider]  metFORMIN (GLUCOPHAGE) 1000 MG tablet Take 1 tablet by mouth 2 (two) times daily. 08/24/19  Yes [provider]  metoprolol succinate (TOPROL-XL) 25 MG 24 hr tablet Take 25 mg by mouth daily.   Yes [provider]  omeprazole (PRILOSEC) 40 MG capsule Take 40 mg by mouth daily. 04/05/18  Yes [provider]  oxybutynin (DITROPAN-XL) 5 MG 24 hr tablet Take 10 mg by mouth at bedtime. 06/11/21  Yes [provider]  VICTOZA 18 MG/3ML SOPN Inject 0.6 mg into the skin daily. 04/19/18  Yes [provider]  nitroGLYCERIN (NITROSTAT) 0.4 MG SL tablet Place under the tongue. 05/07/18   [provider]    Physical Exam: Vitals:   07/18/21 2143 07/18/21 2300 07/19/21 0019 07/19/21 0025  BP: (!) 111/56 (!) 116/51 (!) 121/53   Pulse: 67 63 75   Resp: '13 17 16   '$ Temp:   98.2 F (36.8 C)   TempSrc:    Oral   SpO2: 90% 94% 93%   Weight:    81.6 kg  Height:    '5\' 4"'$  (1.626 m)   1.  General: Patient lying supine in bed,  no acute distress   2. Psychiatric: Alert and oriented x 3, flat affect and behavior is normal for situation, cooperative with exam   3. Neurologic: Speech and language are normal, face is symmetric, moves all 4 extremities voluntarily, equal sensation in all 4 extremities as well, at baseline without acute deficits on limited exam   4. HEENMT:  Head is atraumatic, normocephalic, pupils reactive to light, neck is supple, trachea is midline, mucous membranes are moist   5. Respiratory : Lungs are clear to auscultation bilaterally without wheezing,  rhonchi, rales, no cyanosis, no increase in work of breathing or accessory muscle use   6. Cardiovascular : Heart rate normal, rhythm is regular, no murmurs, rubs or gallops, no peripheral edema, peripheral pulses palpated   7. Gastrointestinal:  Abdomen is soft, nondistended, nontender to palpation bowel sounds active, no masses or organomegaly palpated   8. Skin:  Skin is warm, dry and intact without rashes, acute lesions, or ulcers on limited exam   9.Musculoskeletal:  No acute deformities or trauma, no asymmetry in tone, no peripheral edema, peripheral pulses palpated, no tenderness to palpation in the extremities  Data Reviewed: In the ED 97.4, heart rate 56-67, respiratory rate 13-23, blood pressure 105/52, satting at 90% No leukocytosis, white blood cell count 10.8, platelets 252 Initial Trope 10 CT abdomen pelvis shows no acute findings.  Moderate stool burden, moderate hiatal hernia, 12 mm pancreatic head lesion CT pelvis shows no recent fracture CT head without contrast shows no acute intracranial abnormality, mild cerebral and cerebellar atrophy and chronic small vessel disease X-ray lumbar spine shows unchanged 75% L1 compression fracture X-ray right hip is negative Patient was given 2 doses of 50 mcg of  fentanyl.  She is given normal saline IV fluid and Zofran 4 mg IV patient was admitted due to intractable pain and inability to ambulate  Assessment and Plan: * Intractable pain - Patient is still having pain after 2 doses of fentanyl and 1 dose of hydrocodone -No fracture identified on imaging -Continue pain control with pain scale -PT in the a.m. 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 -Continue to monitor  Hypertension, benign - Patient's blood pressure was low in the ED at 105/52 -Soft BP is likely due to pain medications -Currently holding chlorthalidone, Norvasc -We will continue Coreg in the setting of CAD -Continue to monitor  Hyperlipidemia - Continue Lipitor  GERD (gastroesophageal reflux disease) - Continue PPI  Falls - Initial encounter for fall that occurred today at the movie theater -No fracture on CT pelvis, x-ray lumbar spine, x-ray right hip -Patient did hit head so CT head was done that showed no acute intracranial abnormality -Patient became nauseous later-which could indicate concussion from hitting head, more likely to be result of pain medication, but in any event CT abdomen pelvis was done that shows no acute findings, moderate stool burden, moderate hiatal hernia, and a 12 mm pancreatic head lesion -see assessment named for the same -PT eval in the a.m. -Continue to monitor  Diabetes mellitus (HCC) - Currently holding Jardiance, Glucotrol, metformin, Victoza -Sliding scale coverage -Monitor CBGs -Last A1c was uncontrolled at 11.5  Depressive disorder - Continue Celexa  CAD (coronary artery  disease) - Continue aspirin, Coreg, Lipitor -Currently holding ARB in the setting of hypotension -No cardiac symptoms at this time      Advance Care Planning:   Code Status: DNR   Consults: None  Family Communication: Daughter at bedside  Severity of Illness: The appropriate patient status for this patient is OBSERVATION. Observation status is judged to be reasonable and necessary in order to provide the required intensity of service to ensure the patient's safety. The patient's presenting symptoms, physical exam findings, and initial  radiographic and laboratory data in the context of their medical condition is felt to place them at decreased risk for further clinical deterioration. Furthermore, it is anticipated that the patient will be medically stable for discharge from the hospital within 2 midnights of admission.   Author: Rolla Plate, DO 07/19/2021 12:55 AM  For on call review [email or web address].

## 2021-07-19 NOTE — Assessment & Plan Note (Signed)
Continue Celexa

## 2021-07-19 NOTE — Assessment & Plan Note (Signed)
-   Today the patient's pain is controlled on Naproxen.

## 2021-07-19 NOTE — Assessment & Plan Note (Signed)
-   Initial encounter for fall that occurred today at the movie theater -No fracture on CT pelvis, x-ray lumbar spine, x-ray right hip -Patient did hit head so CT head was done that showed no acute intracranial abnormality -Patient became nauseous later-which could indicate concussion from hitting head, more likely to be result of pain medication, but in any event CT abdomen pelvis was done that shows no acute findings, moderate stool burden, moderate hiatal hernia, and a 12 mm pancreatic head lesion -see assessment named for the same -PT has evaluated the patient. They have recommended home health PT/OT.

## 2021-07-19 NOTE — Assessment & Plan Note (Signed)
-  Continue Lipitor °

## 2021-07-19 NOTE — Assessment & Plan Note (Signed)
-   Continue aspirin, Coreg, Lipitor -Currently holding ARB in the setting of hypotension -No cardiac symptoms at this time At discharge the patient is normotensive without antihypertensives. Resumption of these medications will be left up to PCP.

## 2021-07-20 ENCOUNTER — Encounter: Payer: Self-pay | Admitting: Family Medicine

## 2021-07-20 DIAGNOSIS — R52 Pain, unspecified: Secondary | ICD-10-CM | POA: Diagnosis not present

## 2021-07-20 DIAGNOSIS — M545 Low back pain, unspecified: Secondary | ICD-10-CM | POA: Diagnosis not present

## 2021-07-20 LAB — GLUCOSE, CAPILLARY
Glucose-Capillary: 167 mg/dL — ABNORMAL HIGH (ref 70–99)
Glucose-Capillary: 153 mg/dL — ABNORMAL HIGH (ref 70–99)

## 2021-07-20 LAB — LIPASE, BLOOD: Lipase: 25 U/L (ref 11–51)

## 2021-07-20 MED ORDER — NAPROXEN 500 MG PO TABS: 500.0000 mg | ORAL_TABLET | Freq: Two times a day (BID) | ORAL | 2 refills | Status: DC

## 2021-07-20 MED ORDER — CARVEDILOL 6.25 MG PO TABS: 6.2500 mg | ORAL_TABLET | Freq: Two times a day (BID) | ORAL | 0 refills | Status: AC

## 2021-07-20 MED ORDER — NAPROXEN 500 MG PO TABS
500.0000 mg | ORAL_TABLET | Freq: Two times a day (BID) | ORAL | Status: DC | PRN
Start: 2021-07-20 — End: 2021-07-20
  Administered 2021-07-20: 500 mg via ORAL
  Filled 2021-07-20 (×2): qty 1

## 2021-07-20 NOTE — Progress Notes (Signed)
Received  MD order to discharge patient to home, reviewed discharge instructions, follow up appointments and prescriptions with patient and patient verbalized understanding.

## 2021-07-20 NOTE — Progress Notes (Signed)
Physical Therapy Treatment Patient Details Name: Kerri Mccullough MRN: [id number] DOB: October 11, 1945 Today's Date: 07/20/2021   History of Present Illness Pt is a 76 y.o. female s/[place] fall [place] movie theater (pt put her hand out to balance on cardboard cut out and fell); c/o R low back and pelvic pain; unable to ambulate; became nauseas later.  Imaging did not show any acute osseous abnormality.  Pt admitted with intractable pain, acute respiratory failure with hypoxia, pancreatic mass, and fall.  PMH includes DM, CAD, htn.    PT Comments    Pt resting in bed upon PT arrival; agreeable to PT session.  During session pt SBA semi-supine to sitting edge of bed; SBA with transfers using RW; and CGA progressing to SBA ambulating 200 feet with RW use.  Pain 3/10 R hip/low back at rest (pain increased a little with ambulation).  O2 sats 89% or greater on room air during sessions activities.  From a PT perspective, pt appears safe to discharge home with support of family and HHPT when medically appropriate.  SaO2 on room air at rest = 92% SaO2 on room air while ambulating = 89%     Recommendations for follow up therapy are one component of a multi-disciplinary discharge planning process, led by the attending physician.  Recommendations may be updated based on patient status, additional functional criteria and insurance authorization.  Follow Up Recommendations  Home health PT     Assistance Recommended at Discharge Intermittent Supervision/Assistance  Patient can return home with the following A little help with walking and/or transfers;A little help with bathing/dressing/bathroom;Assistance with cooking/housework;Assist for transportation;Help with stairs or ramp for entrance   Equipment Recommendations  Rolling walker (2 wheels)    Recommendations for Other Services OT consult     Precautions / Restrictions Precautions Precautions: Fall Restrictions Weight Bearing Restrictions: No      Mobility  Bed Mobility Overal bed mobility: Needs Assistance Bed Mobility: Supine to Sit     Supine to sit: Supervision (HOB mildly elevated)     General bed mobility comments: mild increased effort/time to perform on own    Transfers Overall transfer level: Needs assistance Equipment used: Rolling walker (2 wheels) Transfers: Sit to/from Stand Sit to Stand: Supervision           General transfer comment: x1 trial from bed and x1 trial from recliner; mild increased effort to stand up to RW but overall steady and safe    Ambulation/Gait Ambulation/Gait assistance: Min guard, Supervision Gait Distance (Feet): 200 Feet Assistive device: Rolling walker (2 wheels)   Gait velocity: decreased     General Gait Details: partial to step through gait pattern; mildly antalgic (decreased stance time R LE) with increased distance ambulating; steady with RW use   Stairs Stairs:  (Deferred (pt has ramp at home))           Wheelchair Mobility    Modified Rankin (Stroke Patients Only)       Balance Overall balance assessment: Needs assistance Sitting-balance support: No upper extremity supported, Feet supported Sitting balance-Leahy Scale: Normal Sitting balance - Comments: steady sitting reaching outside BOS   Standing balance support: Bilateral upper extremity supported, During functional activity, Reliant on assistive device for balance Standing balance-Leahy Scale: Good Standing balance comment: steady ambulating with RW use                            Cognition Arousal/Alertness: Awake/alert Behavior During Therapy: [facility]  for tasks assessed/performed Overall Cognitive Status: Within Functional Limits for tasks assessed                                          Exercises      General Comments  Nursing cleared pt for participation in physical therapy.  Pt agreeable to PT session.      Pertinent Vitals/Pain Pain Assessment Pain  Assessment: 0-10 Pain Score: 3  Pain Location: R hip/low back Pain Descriptors / Indicators: Aching, Sore Pain Intervention(s): Limited activity within patient's tolerance, Monitored during session, Premedicated before session, Repositioned    Home Living                          Prior Function            PT Goals (current goals can now be found in the care plan section) Acute Rehab PT Goals Patient Stated Goal: to improve pain and go home PT Goal Formulation: With patient/family Time For Goal Achievement: 08/02/21 Potential to Achieve Goals: Good Progress towards PT goals: Progressing toward goals    Frequency    Min 2X/week      PT Plan Current plan remains appropriate    Co-evaluation              AM-PAC PT "6 Clicks" Mobility   Outcome Measure  Help needed turning from your back to your side while in a flat bed without using bedrails?: None Help needed moving from lying on your back to sitting on the side of a flat bed without using bedrails?: None Help needed moving to and from a bed to a chair (including a wheelchair)?: A Little Help needed standing up from a chair using your arms (e.g., wheelchair or bedside chair)?: A Little Help needed to walk in hospital room?: A Little Help needed climbing 3-5 steps with a railing? : A Little 6 Click Score: 20    End of Session Equipment Utilized During Treatment: Gait belt Activity Tolerance: Patient tolerated treatment well Patient left: in chair;with call bell/phone within reach;with chair alarm set;with family/visitor present;Other (comment) (B heels floating via pillow support) Nurse Communication: Mobility status;Precautions (O2 needs) PT Visit Diagnosis: Unsteadiness on feet (R26.81);Other abnormalities of gait and mobility (R26.89);Muscle weakness (generalized) (M62.81);History of falling (Z91.81)     Time: 5625-6389 PT Time Calculation (min) (ACUTE ONLY): 32 min  Charges:  $Gait Training:  8-22 mins $Therapeutic Activity: 8-22 mins                    Leitha Bleak, PT 07/20/21, 12:12 PM

## 2021-07-20 NOTE — TOC Initial Note (Signed)
Transition of Care [facility]) - Initial/Assessment Note    Patient Details  Name: Kerri Mccullough MRN: [id number] Date of Birth: 08-Aug-1945  [facility] CM/SW Contact:    Eileen Stanford, LCSW Phone Number: 07/20/2021, 1:26 PM  Clinical Narrative:     CSW has tried several [place] agencies and they are unable to accept pt. Pt understanding and does not want outpatient. Pt states she will be ok at home. Pt is agreeable to a walker. Walker ordered through [facility].              Expected Discharge Plan: Home/Self Care Barriers to Discharge: No Barriers Identified   Patient Goals and CMS Choice Patient states their goals for this hospitalization and ongoing recovery are:: to go home      Expected Discharge Plan and Services Expected Discharge Plan: Home/Self Care In-house Referral: NA   Post Acute Care Choice: NA Living arrangements for the past 2 months: Single Family Home Expected Discharge Date: 07/20/21               DME Arranged: Gilford Rile DME Agency: [employer] Date DME Agency Contacted: 07/20/21 Time DME Agency Contacted: 3 Representative spoke with at DME Agency: rhonda            Prior Living Arrangements/Services Living arrangements for the past 2 months: [place] Lives with:: Self Patient language and need for interpreter reviewed:: Yes Do you feel safe going back to the place where you live?: Yes      Need for Family Participation in Patient Care: Yes (Comment) Care giver support system in place?: Yes (comment)   Criminal Activity/Legal Involvement Pertinent to Current Situation/Hospitalization: No - Comment as needed  Activities of Daily Living Home Assistive Devices/Equipment: None ADL Screening (condition at time of admission) Patient's cognitive ability adequate to safely complete daily activities?: Yes Is the patient deaf or have difficulty hearing?: No Does the patient have difficulty seeing, even when wearing glasses/contacts?: No Does  the patient have difficulty concentrating, remembering, or making decisions?: No Patient able to express need for assistance with ADLs?: Yes Does the patient have difficulty dressing or bathing?: No Independently performs ADLs?: Yes (appropriate for developmental age) Does the patient have difficulty walking or climbing stairs?: No Weakness of Legs: Both (slight, able to raise, dorsi/plant bilaterally) Weakness of Arms/Hands: None  Permission Sought/Granted   Permission granted to share information with : Yes, Verbal Permission Granted  Share Information with NAME: tracy     Permission granted to share info w Relationship: daughter     Emotional Assessment Appearance:: Appears stated age Attitude/Demeanor/Rapport: Engaged Affect (typically observed): Accepting Orientation: : Oriented to Situation, Oriented to  Time, Oriented to Place, Oriented to Self Alcohol / Substance Use: Not Applicable Psych Involvement: No (comment)  Admission diagnosis:  Intractable pain [R52] Fall, initial encounter [W19.XXXA] Pain, joint, pelvic region, right [M25.551] Patient Active Problem List   Diagnosis Date Noted   Intractable pain 07/18/2021   Pancreatic mass 07/18/2021   Acute respiratory failure with hypoxia ([place]) 07/18/2021   Epigastric pain 09/11/2018   Chest pain 05/05/2018   Constipation 05/25/2017   Urinary incontinence 05/25/2017   No diabetic retinopathy in both eyes 02/10/2017   Age-related osteoporosis with current pathological fracture 09/18/2016   Foot pain, right 09/18/2016   Closed compression fracture of L1 lumbar vertebra, sequela 09/13/2016   Closed fracture of medial malleolus 06/02/2016   Falls 12/16/2015   GERD (gastroesophageal reflux disease) 05/22/2014   Pulmonary nodules 05/22/2014  CAD (coronary artery disease) 06/02/2012   Depressive disorder 12/28/2009   Hyperlipidemia 12/28/2009   Diabetes mellitus ([place]) 06/10/2009   Hypertension, benign 06/10/2009   PCP:   Rutherford Limerick, PA Pharmacy:   [facility] [address]), [place] [address] [place]) [place] [address] Phone: [phone] Fax: [phone]     Social Determinants of Health (SDOH) Interventions    Readmission Risk Interventions     View : No data to display.

## 2021-07-20 NOTE — Discharge Summary (Signed)
Physician Discharge Summary   Patient: Kerri Mccullough MRN: [id number] DOB: December 01, 1945  Admit date:     07/18/2021  Discharge date: 07/20/21  Discharge Physician: Karie Kirks   PCP: Rutherford Limerick, PA   Recommendations at discharge:    Discharge to home with home health PT/OT Follow up with PCP in 7-10 days. Have chemistry and CBC checked at that visit to be reported to PCP. Pt to follow up with [facility] for abnormality of pancreas in 6 months. Pt is to have follow up MRCP to follow pancreatic abnormality found on CT abdomen in 6 months. To be ordered by GI or PCP.  Discharge Diagnoses: Principal Problem:   Intractable pain Active Problems:   CAD (coronary artery disease)   Depressive disorder   Diabetes mellitus ([place])   Falls   GERD (gastroesophageal reflux disease)   Hyperlipidemia   Hypertension, benign   Pancreatic mass   Acute respiratory failure with hypoxia (HCC)  Resolved Problems:   * No resolved hospital problems. [facility] Course: The patient is a 76 yr old woman who presented to [facility] ED on 07/18/2021 via EMS after a fall in a movie theater with complaints of lower back pain and head pain. She was unable to get up on her own after the fall.   In the ED the patient was found to have no acute intracranial abnormality on the CT head. It did show mild cerebral and cerebellar atrophy and chronic small vessel disease. Imager of the L-spine demonstrated no evidence of acute abnormality and unchanged 75% compression fracture of the L1. X-ray of the right hip was also negative for fracture. CT pelvis was also negative except for degenerative changes are noted in the pubic symphysis and L-spine.    The patient continued to complain of severe pain. She continued to have decreased oxygen saturations. This was thought to be due to her opiate medications. 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 MRCP is pending.    The patient was admitted to a medical bed by my  colleague, Dr. Clearence Ped early morning 07/19/2021.  The patient has had pain control. She has been evaluated by PT/OT. Recommendation is for home health PT/OT.  MRCP was obtained to further investigate the pancreatic abnormality described on CT abdomen and pelvis on admission. Radiology report states that it is "multilobular cystic complex in the head of the pancrease and has no specific worrisome characteristics." Recommendation is for follow-up MRI in 6 months. I discussed the patient with Dr. Allen Norris. He recommended only follow up with GI as outpatient.  The patient will be discharged to home today in fair condition.   Assessment and Plan: * Intractable pain - Today the patient's pain is controlled on Naproxen.  Acute respiratory failure with hypoxia (HCC) - Patient required oxygen supplementation after both doses of fentanyl -Oxygen sats dropped to low 90s/high 80s -No other respiratory symptoms -Thought to be completely related to the opiate medication --The patient has been ambulated on room air. O2 saturations dropped to 89%. She tolerated ambulation well. She does not meet criteria for home O2.   Pancreatic mass - 12 mm pancreatic head lesion seen on CT recommended pancreatic protocol MRI MRCP has demonstrated "multilobular cystic complex in the head of the pancrease and has no specific worrisome characteristics." Recommendation is for follow-up MRI in 6 months. I discussed the patient with Dr. Allen Norris. He recommended only follow up with GI as outpatient.  Hypertension, benign The patient has remained  normotensive without restarting norvasc and chlorthalidone. Will defer resumption of these medications to PCP.  Coreg has been continued in the setting of CAD  Hyperlipidemia - Continue Lipitor  GERD (gastroesophageal reflux disease) - Continue PPI  Falls - Initial encounter for fall that occurred today at the movie theater -No fracture on CT pelvis, x-ray lumbar spine, x-ray  right hip -Patient did hit head so CT head was done that showed no acute intracranial abnormality -Patient became nauseous later-which could indicate concussion from hitting head, more likely to be result of pain medication, but in any event CT abdomen pelvis was done that shows no acute findings, moderate stool burden, moderate hiatal hernia, and a 12 mm pancreatic head lesion -see assessment named for the same -PT has evaluated the patient. They have recommended home health PT/OT.   Diabetes mellitus ([place]) Restart Jardiance, Glucotrol, metformin, and Victoza for discharge -Sliding scale coverage -Monitor CBGs -Last A1c was uncontrolled at 11.5 - deferred to PCP.  Depressive disorder - Continue Celexa  CAD (coronary artery disease) - Continue aspirin, Coreg, Lipitor -Currently holding ARB in the setting of hypotension -No cardiac symptoms at this time At discharge the patient is normotensive without antihypertensives. Resumption of these medications will be left up to PCP.   Consultants: None Procedures performed: None  Disposition: Home health Diet recommendation:  Discharge Diet Orders (From admission, onward)     Start     Ordered   07/20/21 0000  Diet - low sodium heart healthy        07/20/21 1247           Cardiac and Carb modified diet DISCHARGE MEDICATION: Allergies as of 07/20/2021       Reactions   Ace Inhibitors Swelling   Rosiglitazone Other (See Comments)   Other Reaction: SOB, Pulmonary Edema Other Reaction: SOB, Pulmonary Edema Other Reaction: SOB, Pulmonary Edema   Aspirin Swelling   When taken with a beta blocker    Golytely [peg 3350-electrolytes] Swelling        Medication List     STOP taking these medications    chlorthalidone 25 MG tablet Commonly known as: HYGROTON   losartan 25 MG tablet Commonly known as: COZAAR   metoprolol succinate 25 MG 24 hr tablet Commonly known as: TOPROL-XL   oxybutynin 5 MG 24 hr tablet Commonly  known as: DITROPAN-XL       TAKE these medications    alendronate 70 MG tablet Commonly known as: FOSAMAX Take 1 tablet by mouth once a week.   amLODipine 5 MG tablet Commonly known as: NORVASC Take 5 mg by mouth 2 (two) times daily.   aspirin EC 81 MG tablet Take 81 mg by mouth daily.   atorvastatin 20 MG tablet Commonly known as: LIPITOR Take 20 mg by mouth daily.   carvedilol 6.25 MG tablet Commonly known as: COREG Take 1 tablet (6.25 mg total) by mouth 2 (two) times daily with a meal. What changed:  how much to take when to take this   citalopram 20 MG tablet Commonly known as: CELEXA Take 20 mg by mouth daily.   gabapentin 300 MG capsule Commonly known as: NEURONTIN Take 300 mg by mouth 3 (three) times daily.   glipiZIDE 5 MG 24 hr tablet Commonly known as: GLUCOTROL XL Take 5 mg by mouth daily.   Jardiance 25 MG Tabs tablet Generic drug: empagliflozin Take 25 mg by mouth daily.   metFORMIN 1000 MG tablet Commonly known as: GLUCOPHAGE Take 1 tablet  by mouth 2 (two) times daily.   naproxen 500 MG tablet Commonly known as: Naprosyn Take 1 tablet (500 mg total) by mouth 2 (two) times daily with a meal.   nitroGLYCERIN 0.4 MG SL tablet Commonly known as: NITROSTAT Place under the tongue.   omeprazole 40 MG capsule Commonly known as: PRILOSEC Take 40 mg by mouth daily.   Victoza 18 MG/3ML Sopn Generic drug: liraglutide Inject 0.6 mg into the skin daily.               Durable Medical Equipment  (From admission, onward)           Start     Ordered   07/20/21 1215  For home use only DME Walker rolling  Once       Question Answer Comment  Walker: With 5 Inch Wheels   Patient needs a walker to treat with the following condition History of falling      07/20/21 1215            Discharge Exam: Filed Weights   07/19/21 0025  Weight: 81.6 kg   Vitals:   07/20/21 0738 07/20/21 0851  BP: 139/62 132/66  Pulse: 64 68  Resp: 17    Temp: 98.2 F (36.8 C)   SpO2: 92%    Exam:  Constitutional:  The patient is awake, alert, and oriented x 3. No acute distress. Respiratory:  No increased work of breathing. No wheezes, rales, or rhonchi No tactile fremitus Cardiovascular:  Regular rate and rhythm No murmurs, ectopy, or gallups. No lateral PMI. No thrills. Abdomen:  Abdomen is soft, non-tender, non-distended No hernias, masses, or organomegaly Normoactive bowel sounds.  Musculoskeletal:  No cyanosis, clubbing, or edema Skin:  No rashes, lesions, ulcers palpation of skin: no induration or nodules Neurologic:  CN 2-12 intact Sensation all 4 extremities intact Psychiatric:  Mental status Mood, affect appropriate Orientation to person, place, time  judgment and insight appear intact   Condition at discharge: fair  The results of significant diagnostics from this hospitalization (including imaging, microbiology, ancillary and laboratory) are listed below for reference.   Imaging Studies: DG Lumbar Spine Complete  Result Date: 07/18/2021 CLINICAL DATA:  76 year old female with low back pain following fall. EXAM: LUMBAR SPINE - COMPLETE 4+ VIEW COMPARISON:  08/11/2019 chest CT and prior studies FINDINGS: A 75% L1 compression fracture is unchanged. No acute fracture or subluxation identified. No focal bony lesions are present. Mild degenerative disc disease at L5-S1 noted. Aortic atherosclerotic calcifications noted. IMPRESSION: 1. No evidence of acute abnormality. 2. Unchanged 75% L1 compression fracture. Electronically Signed   By: Margarette Canada M.D.   On: 07/18/2021 16:29   CT Head Wo Contrast  Result Date: 07/18/2021 CLINICAL DATA:  Dizziness.  Fall.  Hit head on wall. EXAM: CT HEAD WITHOUT CONTRAST TECHNIQUE: Contiguous axial images were obtained from the base of the skull through the vertex without intravenous contrast. RADIATION DOSE REDUCTION: This exam was performed according to the departmental  dose-optimization program which includes automated exposure control, adjustment of the mA and/or kV according to patient size and/or use of iterative reconstruction technique. COMPARISON:  None Available. FINDINGS: Brain: No evidence of intracranial hemorrhage, acute infarction, hydrocephalus, extra-axial collection, or mass lesion/mass effect. Mild diffuse cerebral and cerebellar atrophy noted. Mild chronic small vessel disease. Vascular:  No hyperdense vessel or other acute findings. Skull: No evidence of fracture or other significant bone abnormality. Sinuses/Orbits:  No acute findings. Other: None. IMPRESSION: No acute intracranial abnormality. Mild  cerebral and cerebellar atrophy and chronic small vessel disease. Electronically Signed   By: Marlaine Hind M.D.   On: 07/18/2021 16:13   CT PELVIS WO CONTRAST  Result Date: 07/18/2021 CLINICAL DATA:  Trauma, fall EXAM: CT PELVIS WITHOUT CONTRAST TECHNIQUE: Multidetector CT imaging of the pelvis was performed following the standard protocol without intravenous contrast. RADIATION DOSE REDUCTION: This exam was performed according to the departmental dose-optimization program which includes automated exposure control, adjustment of the mA and/or kV according to patient size and/or use of iterative reconstruction technique. COMPARISON:  Radiographs done earlier today FINDINGS: There is no free fluid in the pelvic cavity. There is no wall thickening in the visualized bowel loops. Few diverticula are seen in the sigmoid colon. Uterus is not seen. Urinary bladder is unremarkable. Arterial calcifications are seen. No recent displaced fracture or dislocation is seen in the pelvis. Degenerative changes are noted in the pubic symphysis with bony spurs. There is no effusion in the right hip joint. Degenerative changes are noted in facet joints in the lower lumbar spine. Small umbilical hernia containing fat is seen. There is mild stranding in the subcutaneous plane in the  anterior abdominal wall possibly sites of previous parenteral administration of medication. IMPRESSION: No recent fracture is seen in the pelvis with attention to the right hip. There is no evidence of any free fluid in the pelvic cavity. There is no bowel wall thickening. Urinary bladder is unremarkable. Degenerative changes are noted in the pubic symphysis and lower lumbar spine. Few diverticula are seen in the colon. Atherosclerotic changes are noted. Other findings as described in the body of the report. Electronically Signed   By: Elmer Picker M.D.   On: 07/18/2021 18:16   CT ABDOMEN PELVIS W CONTRAST  Result Date: 07/18/2021 CLINICAL DATA:  Nausea.  Patient reports dizziness leading to fall. EXAM: CT ABDOMEN AND PELVIS WITH CONTRAST TECHNIQUE: Multidetector CT imaging of the abdomen and pelvis was performed using the standard protocol following bolus administration of intravenous contrast. RADIATION DOSE REDUCTION: This exam was performed according to the departmental dose-optimization program which includes automated exposure control, adjustment of the mA and/or kV according to patient size and/or use of iterative reconstruction technique. CONTRAST:  146m OMNIPAQUE IOHEXOL 300 MG/ML  SOLN COMPARISON:  Pelvis CT earlier today FINDINGS: Lower chest: Nodular areas of pleural thickening in the left lower lobe are stable from 09/10/2019 chest CT and considered benign. Hepatobiliary: Prominent right lobe of the liver spanning 18.3 cm cranial caudal. No focal liver lesion. Punctate calcified granuloma in the right lobe. Gallbladder physiologically distended, no calcified stone. No biliary dilatation. Pancreas: Fatty atrophy of the pancreatic head. 12 mm low-density pancreatic head lesion, series 2, image 30. No ductal dilatation or peripancreatic edema. Spleen: Normal in size without focal abnormality. Adrenals/Urinary Tract: Normal adrenal glands. Mild cortical scarring in both kidneys. No  hydronephrosis. Homogeneous enhancement with symmetric excretion on delayed phase imaging. No renal stone or focal lesion. Only minimally distended urinary bladder, not well assessed. Stomach/Bowel: Moderate-sized hiatal hernia. Otherwise decompressed stomach. No small bowel obstruction or inflammation. Diminutive appendix tentatively visualized, regardless no appendicitis. Moderate colonic stool burden. No colonic inflammation. Vascular/Lymphatic: Moderate aortic atherosclerosis. No aortic aneurysm. Patent portal vein. No abdominopelvic adenopathy. Reproductive: Hysterectomy.  Quiescent ovaries.  No adnexal mass. Other: No free air, free fluid, or intra-abdominal fluid collection. Wispy density in the anterior abdominal wall typical of medication injection sites. Tiny fat containing umbilical hernia. Musculoskeletal: Chronic L1 compression fracture is chronic and stable  from prior chest CT. No acute osseous abnormalities. IMPRESSION: 1. No acute abnormality in the abdomen/pelvis. 2. Moderate colonic stool burden, can be seen with constipation. 3. Moderate-sized hiatal hernia. 4. A 12 mm low-density pancreatic head lesion is nonspecific. Recommend nonemergent pancreatic protocol MRI for further characterization. This should be performed on an elective outpatient basis after resolution of acute event. Aortic Atherosclerosis (ICD10-I70.0). Electronically Signed   By: Keith Rake M.D.   On: 07/18/2021 21:38   MR 3D Recon At Scanner  Result Date: 07/19/2021 CLINICAL DATA:  Cystic pancreatic lesion identified on CT exam MRI recommended for further evaluation. EXAM: MRI ABDOMEN WITHOUT AND WITH CONTRAST (INCLUDING MRCP) TECHNIQUE: Multiplanar multisequence MR imaging of the abdomen was performed both before and after the administration of intravenous contrast. Heavily T2-weighted images of the biliary and pancreatic ducts were obtained, and three-dimensional MRCP images were rendered by post processing. CONTRAST:   38m GADAVIST GADOBUTROL 1 MMOL/ML IV SOLN COMPARISON:  None Available. FINDINGS: Lower chest:  Lung bases are clear. Hepatobiliary: No hepatic lesion. No biliary dilatation. Gallbladder normal. Common bile duct normal. Pancreas: Cluster of small cysts in the head of the pancreas. Largest cyst measures 12 mm (image 21/series 2). Several adjacent smaller cystic lesions. The combined cystic complex measures 24 mm x 16 mm in coronal dimension (image 8/series 15). The cysts are hyperintense on T2 weighted imaging and demonstrates no post-contrast enhancement. Pancreatic head cysts are adjacent to the pancreatic duct but no clear communication with the duct. Pancreatic duct is nondilated. Fatty infiltration along the body of pancreas. Spleen: Normal spleen. Adrenals/urinary tract: Adrenal glands and kidneys are normal. Stomach/Bowel: Stomach and limited of the small bowel is unremarkable Vascular/Lymphatic: Abdominal aortic normal caliber. No retroperitoneal periportal lymphadenopathy. Musculoskeletal: No aggressive osseous lesion IMPRESSION: 1. Multilobular cystic complex in the head of the pancreas has no specific worrisome characteristics. 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 Per consensus criteria, recommend follow-up MRI in 6 months. This recommendation follows ACR consensus guidelines: Management of Incidental Pancreatic Cysts: A White Paper of the ACR Incidental Findings Committee. J Am Coll Radiol 28099;83:382-505 2. Normal biliary tree. Electronically Signed   By: SSuzy BouchardM.D.   On: 07/19/2021 07:53   MR ABDOMEN MRCP W WO CONTAST  Result Date: 07/19/2021 CLINICAL DATA:  Cystic pancreatic lesion identified on CT exam MRI recommended for further evaluation. EXAM: MRI ABDOMEN WITHOUT AND WITH CONTRAST (INCLUDING MRCP) TECHNIQUE: Multiplanar multisequence MR imaging of the abdomen was performed both before and after the administration of intravenous contrast.  Heavily T2-weighted images of the biliary and pancreatic ducts were obtained, and three-dimensional MRCP images were rendered by post processing. CONTRAST:  819mGADAVIST GADOBUTROL 1 MMOL/ML IV SOLN COMPARISON:  None Available. FINDINGS: Lower chest:  Lung bases are clear. Hepatobiliary: No hepatic lesion. No biliary dilatation. Gallbladder normal. Common bile duct normal. Pancreas: Cluster of small cysts in the head of the pancreas. Largest cyst measures 12 mm (image 21/series 2). Several adjacent smaller cystic lesions. The combined cystic complex measures 24 mm x 16 mm in coronal dimension (image 8/series 15). The cysts are hyperintense on T2 weighted imaging and demonstrates no post-contrast enhancement. Pancreatic head cysts are adjacent to the pancreatic duct but no clear communication with the duct. Pancreatic duct is nondilated. Fatty infiltration along the body of pancreas. Spleen: Normal spleen. Adrenals/urinary tract: Adrenal glands and kidneys are normal. Stomach/Bowel: Stomach and limited of the small bowel is unremarkable Vascular/Lymphatic: Abdominal aortic normal caliber. No retroperitoneal  periportal lymphadenopathy. Musculoskeletal: No aggressive osseous lesion IMPRESSION: 1. Multilobular cystic complex in the head of the pancreas has no specific worrisome characteristics. 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 Per consensus criteria, recommend follow-up MRI in 6 months. This recommendation follows ACR consensus guidelines: Management of Incidental Pancreatic Cysts: A White Paper of the ACR Incidental Findings Committee. J Am Coll Radiol 0388;82:800-349. 2. Normal biliary tree. Electronically Signed   By: Suzy Bouchard M.D.   On: 07/19/2021 07:53   DG Hip Unilat W or Wo Pelvis 2-3 Views Right  Result Date: 07/18/2021 CLINICAL DATA:  Acute RIGHT hip pain following fall. Initial encounter. EXAM: DG HIP (WITH OR WITHOUT PELVIS) 2-3V RIGHT COMPARISON:   None Available. FINDINGS: There is no evidence of hip fracture or dislocation. There is no evidence of arthropathy or other focal bone abnormality. IMPRESSION: Negative. Electronically Signed   By: Margarette Canada M.D.   On: 07/18/2021 16:31    Microbiology: Results for orders placed or performed during the hospital encounter of 10/14/19  KOH prep     Status: None   Collection Time: 10/14/19  9:00 AM   Specimen: Esophagus  Result Value Ref Range Status   Specimen Description ESOPHAGUS  Final   Special Requests Normal  Final   KOH Prep   Final    YEAST WITH PSEUDOHYPHAE BUDDING YEAST SEEN Performed at [facility], [address] [place],  [address]    Report Status 10/14/2019 FINAL  Final    Labs: CBC: Recent Labs  Lab 07/18/21 1445 07/19/21 0444  WBC 7.8 10.0  NEUTROABS  --  6.7  HGB 10.8* 10.5*  HCT 36.6 34.9*  MCV 81.9 80.8  PLT 252 056   Basic Metabolic Panel: Recent Labs  Lab 07/18/21 1445 07/19/21 0444  NA 135 137  K 3.7 3.3*  CL 100 97*  CO2 28 28  GLUCOSE 134* 155*  BUN 17 15  CREATININE 1.01* 0.98  CALCIUM 9.4 9.0  MG  --  2.1   Liver Function Tests: Recent Labs  Lab 07/19/21 0444  AST 19  ALT 16  ALKPHOS 47  BILITOT 0.6  PROT 7.4  ALBUMIN 3.4*   CBG: Recent Labs  Lab 07/19/21 1217 07/19/21 1647 07/19/21 2149 07/20/21 0802 07/20/21 1159  GLUCAP 276* 199* 136* 167* 153*    Discharge time spent: greater than 30 minutes.  Signed: Nance Mccombs, DO [facility] 07/20/2021

## 2021-07-29 ENCOUNTER — Ambulatory Visit: Payer: Medicare Other | Admitting: Dermatology

## 2021-07-30 ENCOUNTER — Other Ambulatory Visit: Payer: Self-pay

## 2021-07-30 ENCOUNTER — Encounter: Payer: Self-pay | Admitting: Intensive Care

## 2021-07-30 ENCOUNTER — Emergency Department: Payer: Medicare Other

## 2021-07-30 ENCOUNTER — Observation Stay
Admission: EM | Admit: 2021-07-30 | Discharge: 2021-08-01 | Disposition: A | Discharge status: Home / Self Care | Payer: Medicare Other | Attending: Internal Medicine | Admitting: Internal Medicine

## 2021-07-30 DIAGNOSIS — N39498 Other specified urinary incontinence: Secondary | ICD-10-CM | POA: Insufficient documentation

## 2021-07-30 DIAGNOSIS — M25552 Pain in left hip: Secondary | ICD-10-CM | POA: Insufficient documentation

## 2021-07-30 DIAGNOSIS — Z9181 History of falling: Secondary | ICD-10-CM | POA: Insufficient documentation

## 2021-07-30 DIAGNOSIS — S32020A Wedge compression fracture of second lumbar vertebra, initial encounter for closed fracture: Secondary | ICD-10-CM

## 2021-07-30 DIAGNOSIS — M48061 Spinal stenosis, lumbar region without neurogenic claudication: Secondary | ICD-10-CM | POA: Insufficient documentation

## 2021-07-30 DIAGNOSIS — Z955 Presence of coronary angioplasty implant and graft: Secondary | ICD-10-CM | POA: Insufficient documentation

## 2021-07-30 DIAGNOSIS — R7989 Other specified abnormal findings of blood chemistry: Secondary | ICD-10-CM | POA: Insufficient documentation

## 2021-07-30 DIAGNOSIS — I2510 Atherosclerotic heart disease of native coronary artery without angina pectoris: Secondary | ICD-10-CM | POA: Diagnosis present

## 2021-07-30 DIAGNOSIS — Z602 Problems related to living alone: Secondary | ICD-10-CM | POA: Insufficient documentation

## 2021-07-30 DIAGNOSIS — R112 Nausea with vomiting, unspecified: Secondary | ICD-10-CM | POA: Diagnosis not present

## 2021-07-30 DIAGNOSIS — S32018A Other fracture of first lumbar vertebra, initial encounter for closed fracture: Secondary | ICD-10-CM | POA: Insufficient documentation

## 2021-07-30 DIAGNOSIS — W19XXXA Unspecified fall, initial encounter: Secondary | ICD-10-CM | POA: Diagnosis not present

## 2021-07-30 DIAGNOSIS — Z87891 Personal history of nicotine dependence: Secondary | ICD-10-CM | POA: Diagnosis not present

## 2021-07-30 DIAGNOSIS — S32000A Wedge compression fracture of unspecified lumbar vertebra, initial encounter for closed fracture: Secondary | ICD-10-CM | POA: Diagnosis not present

## 2021-07-30 DIAGNOSIS — R1030 Lower abdominal pain, unspecified: Secondary | ICD-10-CM | POA: Diagnosis not present

## 2021-07-30 DIAGNOSIS — Z794 Long term (current) use of insulin: Secondary | ICD-10-CM | POA: Insufficient documentation

## 2021-07-30 DIAGNOSIS — Z79899 Other long term (current) drug therapy: Secondary | ICD-10-CM | POA: Diagnosis not present

## 2021-07-30 DIAGNOSIS — Z7982 Long term (current) use of aspirin: Secondary | ICD-10-CM | POA: Insufficient documentation

## 2021-07-30 DIAGNOSIS — E7800 Pure hypercholesterolemia, unspecified: Secondary | ICD-10-CM | POA: Insufficient documentation

## 2021-07-30 DIAGNOSIS — M25551 Pain in right hip: Secondary | ICD-10-CM | POA: Diagnosis not present

## 2021-07-30 DIAGNOSIS — Z8249 Family history of ischemic heart disease and other diseases of the circulatory system: Secondary | ICD-10-CM | POA: Insufficient documentation

## 2021-07-30 DIAGNOSIS — K5900 Constipation, unspecified: Secondary | ICD-10-CM | POA: Insufficient documentation

## 2021-07-30 DIAGNOSIS — S32028A Other fracture of second lumbar vertebra, initial encounter for closed fracture: Secondary | ICD-10-CM | POA: Diagnosis not present

## 2021-07-30 DIAGNOSIS — M545 Low back pain, unspecified: Secondary | ICD-10-CM | POA: Diagnosis not present

## 2021-07-30 DIAGNOSIS — M8008XA Age-related osteoporosis with current pathological fracture, vertebra(e), initial encounter for fracture: Secondary | ICD-10-CM | POA: Diagnosis not present

## 2021-07-30 DIAGNOSIS — K862 Cyst of pancreas: Secondary | ICD-10-CM | POA: Insufficient documentation

## 2021-07-30 DIAGNOSIS — E119 Type 2 diabetes mellitus without complications: Secondary | ICD-10-CM | POA: Diagnosis not present

## 2021-07-30 DIAGNOSIS — K219 Gastro-esophageal reflux disease without esophagitis: Secondary | ICD-10-CM | POA: Insufficient documentation

## 2021-07-30 DIAGNOSIS — M47816 Spondylosis without myelopathy or radiculopathy, lumbar region: Secondary | ICD-10-CM | POA: Insufficient documentation

## 2021-07-30 DIAGNOSIS — Z66 Do not resuscitate: Secondary | ICD-10-CM | POA: Insufficient documentation

## 2021-07-30 DIAGNOSIS — I493 Ventricular premature depolarization: Secondary | ICD-10-CM | POA: Insufficient documentation

## 2021-07-30 DIAGNOSIS — E785 Hyperlipidemia, unspecified: Secondary | ICD-10-CM | POA: Diagnosis present

## 2021-07-30 DIAGNOSIS — Z85828 Personal history of other malignant neoplasm of skin: Secondary | ICD-10-CM | POA: Insufficient documentation

## 2021-07-30 DIAGNOSIS — Z833 Family history of diabetes mellitus: Secondary | ICD-10-CM | POA: Insufficient documentation

## 2021-07-30 DIAGNOSIS — R9431 Abnormal electrocardiogram [ECG] [EKG]: Secondary | ICD-10-CM | POA: Insufficient documentation

## 2021-07-30 DIAGNOSIS — F32A Depression, unspecified: Secondary | ICD-10-CM | POA: Diagnosis not present

## 2021-07-30 DIAGNOSIS — K8689 Other specified diseases of pancreas: Secondary | ICD-10-CM | POA: Diagnosis not present

## 2021-07-30 DIAGNOSIS — R8290 Unspecified abnormal findings in urine: Secondary | ICD-10-CM | POA: Diagnosis not present

## 2021-07-30 DIAGNOSIS — M6281 Muscle weakness (generalized): Secondary | ICD-10-CM | POA: Insufficient documentation

## 2021-07-30 DIAGNOSIS — I10 Essential (primary) hypertension: Secondary | ICD-10-CM | POA: Diagnosis not present

## 2021-07-30 DIAGNOSIS — R2689 Other abnormalities of gait and mobility: Secondary | ICD-10-CM | POA: Insufficient documentation

## 2021-07-30 DIAGNOSIS — M5459 Other low back pain: Secondary | ICD-10-CM

## 2021-07-30 DIAGNOSIS — Z7984 Long term (current) use of oral hypoglycemic drugs: Secondary | ICD-10-CM | POA: Insufficient documentation

## 2021-07-30 DIAGNOSIS — R200 Anesthesia of skin: Secondary | ICD-10-CM | POA: Insufficient documentation

## 2021-07-30 LAB — COMPREHENSIVE METABOLIC PANEL
ALT: 17 U/L (ref 0–44)
AST: 21 U/L (ref 15–41)
Albumin: 4 g/dL (ref 3.5–5.0)
Alkaline Phosphatase: 63 U/L (ref 38–126)
Anion gap: 9 (ref 5–15)
BUN: 12 mg/dL (ref 8–23)
CO2: 24 mmol/L (ref 22–32)
Calcium: 9.1 mg/dL (ref 8.9–10.3)
Chloride: 103 mmol/L (ref 98–111)
Creatinine, Ser: 0.98 mg/dL (ref 0.44–1.00)
GFR, Estimated: >60 mL/min (ref >60)
Glucose, Bld: 166 mg/dL — ABNORMAL HIGH (ref 70–99)
Potassium: 4.1 mmol/L (ref 3.5–5.1)
Sodium: 136 mmol/L (ref 135–145)
Total Bilirubin: 0.8 mg/dL (ref 0.3–1.2)
Total Protein: 8.6 g/dL — ABNORMAL HIGH (ref 6.5–8.1)

## 2021-07-30 LAB — URINALYSIS, ROUTINE W REFLEX MICROSCOPIC
Bacteria, UA: NONE SEEN
Bilirubin Urine: NEGATIVE
Glucose, UA: >=500 mg/dL — AB
Hgb urine dipstick: NEGATIVE
Ketones, ur: NEGATIVE mg/dL
Leukocytes,Ua: NEGATIVE
Nitrite: NEGATIVE
Protein, ur: 30 mg/dL — AB
Specific Gravity, Urine: 1.027 (ref 1.005–1.030)
pH: 5 (ref 5.0–8.0)

## 2021-07-30 LAB — CBC
HCT: 40.7 % (ref 36.0–46.0)
Hemoglobin: 12.2 g/dL (ref 12.0–15.0)
MCH: 24.4 pg — ABNORMAL LOW (ref 26.0–34.0)
MCHC: 30 g/dL (ref 30.0–36.0)
MCV: 81.6 fL (ref 80.0–100.0)
Platelets: 376 10*3/uL (ref 150–400)
RBC: 4.99 MIL/uL (ref 3.87–5.11)
RDW: 18.4 % — ABNORMAL HIGH (ref 11.5–15.5)
WBC: 8.4 10*3/uL (ref 4.0–10.5)
nRBC: 0 % (ref 0.0–0.2)

## 2021-07-30 LAB — PROTIME-INR
INR: 1.2 (ref 0.8–1.2)
Prothrombin Time: 15.1 seconds (ref 11.4–15.2)

## 2021-07-30 LAB — APTT: aPTT: 34 seconds (ref 24–36)

## 2021-07-30 LAB — GLUCOSE, CAPILLARY: Glucose-Capillary: 147 mg/dL — ABNORMAL HIGH (ref 70–99)

## 2021-07-30 LAB — CBG MONITORING, ED: Glucose-Capillary: 104 mg/dL — ABNORMAL HIGH (ref 70–99)

## 2021-07-30 LAB — LIPASE, BLOOD: Lipase: 27 U/L (ref 11–51)

## 2021-07-30 MED ORDER — HEPARIN SODIUM (PORCINE) 5000 UNIT/ML IJ SOLN
5000.0000 [IU] | Freq: Three times a day (TID) | INTRAMUSCULAR | Status: DC
  Administered 2021-07-30 – 2021-08-01 (×6): 5000 [IU] via SUBCUTANEOUS
  Filled 2021-07-30 (×6): qty 1

## 2021-07-30 MED ORDER — AMLODIPINE BESYLATE 5 MG PO TABS
5.0000 mg | ORAL_TABLET | Freq: Two times a day (BID) | ORAL | Status: DC
  Administered 2021-07-30 – 2021-08-01 (×4): 5 mg via ORAL
  Filled 2021-07-30 (×4): qty 1

## 2021-07-30 MED ORDER — MORPHINE SULFATE (PF) 2 MG/ML IV SOLN
2.0000 mg | Freq: Once | INTRAVENOUS | Status: AC
  Administered 2021-07-30: 2 mg via INTRAVENOUS
  Filled 2021-07-30: qty 1

## 2021-07-30 MED ORDER — METHOCARBAMOL 500 MG PO TABS: 500.0000 mg | ORAL_TABLET | Freq: Three times a day (TID) | ORAL | Status: DC | PRN

## 2021-07-30 MED ORDER — ONDANSETRON HCL 4 MG/2ML IJ SOLN
4.0000 mg | Freq: Three times a day (TID) | INTRAMUSCULAR | Status: DC | PRN
  Administered 2021-07-30: 4 mg via INTRAVENOUS
  Filled 2021-07-30: qty 2

## 2021-07-30 MED ORDER — INSULIN ASPART 100 UNIT/ML IJ SOLN: 0.0000 [IU] | Freq: Every day | INTRAMUSCULAR | Status: DC

## 2021-07-30 MED ORDER — PANTOPRAZOLE SODIUM 40 MG PO TBEC
40.0000 mg | DELAYED_RELEASE_TABLET | Freq: Every day | ORAL | Status: DC
  Administered 2021-07-31 – 2021-08-01 (×2): 40 mg via ORAL
  Filled 2021-07-30 (×2): qty 1

## 2021-07-30 MED ORDER — OXYCODONE-ACETAMINOPHEN 5-325 MG PO TABS
1.0000 | ORAL_TABLET | ORAL | Status: DC | PRN
  Administered 2021-07-30 – 2021-08-01 (×6): 1 via ORAL
  Filled 2021-07-30 (×6): qty 1

## 2021-07-30 MED ORDER — HYDRALAZINE HCL 20 MG/ML IJ SOLN
5.0000 mg | INTRAMUSCULAR | Status: DC | PRN
  Administered 2021-07-30: 5 mg via INTRAVENOUS
  Filled 2021-07-30: qty 1

## 2021-07-30 MED ORDER — MORPHINE SULFATE (PF) 2 MG/ML IV SOLN
2.0000 mg | INTRAVENOUS | Status: DC | PRN
  Administered 2021-07-30 – 2021-07-31 (×4): 2 mg via INTRAVENOUS
  Filled 2021-07-30 (×4): qty 1

## 2021-07-30 MED ORDER — SODIUM CHLORIDE 0.9 % IV BOLUS
500.0000 mL | Freq: Once | INTRAVENOUS | Status: AC
  Administered 2021-07-30: 500 mL via INTRAVENOUS

## 2021-07-30 MED ORDER — CARVEDILOL 6.25 MG PO TABS
6.2500 mg | ORAL_TABLET | Freq: Two times a day (BID) | ORAL | Status: DC
  Administered 2021-07-30 – 2021-08-01 (×4): 6.25 mg via ORAL
  Filled 2021-07-30 (×4): qty 1

## 2021-07-30 MED ORDER — NITROGLYCERIN 0.4 MG SL SUBL: 0.4000 mg | SUBLINGUAL_TABLET | SUBLINGUAL | Status: DC | PRN

## 2021-07-30 MED ORDER — PREGABALIN 25 MG PO CAPS
25.0000 mg | ORAL_CAPSULE | Freq: Two times a day (BID) | ORAL | Status: DC
  Administered 2021-07-30 – 2021-08-01 (×5): 25 mg via ORAL
  Filled 2021-07-30 (×5): qty 1

## 2021-07-30 MED ORDER — LIDOCAINE 5 % EX PTCH
1.0000 | MEDICATED_PATCH | CUTANEOUS | Status: DC
  Administered 2021-07-30 – 2021-07-31 (×2): 1 via TRANSDERMAL
  Filled 2021-07-30 (×2): qty 1

## 2021-07-30 MED ORDER — CITALOPRAM HYDROBROMIDE 20 MG PO TABS
20.0000 mg | ORAL_TABLET | Freq: Every day | ORAL | Status: DC
  Administered 2021-07-31 – 2021-08-01 (×2): 20 mg via ORAL
  Filled 2021-07-30 (×2): qty 1

## 2021-07-30 MED ORDER — INSULIN ASPART 100 UNIT/ML IJ SOLN
0.0000 [IU] | Freq: Three times a day (TID) | INTRAMUSCULAR | Status: DC
  Administered 2021-07-31: 1 [IU] via SUBCUTANEOUS
  Administered 2021-07-31: 2 [IU] via SUBCUTANEOUS
  Administered 2021-07-31: 3 [IU] via SUBCUTANEOUS
  Administered 2021-08-01: 2 [IU] via SUBCUTANEOUS
  Filled 2021-07-30 (×4): qty 1

## 2021-07-30 MED ORDER — ONDANSETRON 4 MG PO TBDP
4.0000 mg | ORAL_TABLET | Freq: Once | ORAL | Status: AC | PRN
  Administered 2021-07-30: 4 mg via ORAL
  Filled 2021-07-30: qty 1

## 2021-07-30 MED ORDER — ONDANSETRON HCL 4 MG/2ML IJ SOLN
4.0000 mg | Freq: Once | INTRAMUSCULAR | Status: AC
  Administered 2021-07-30: 4 mg via INTRAVENOUS
  Filled 2021-07-30: qty 2

## 2021-07-30 MED ORDER — ACETAMINOPHEN 325 MG PO TABS: 650.0000 mg | ORAL_TABLET | Freq: Four times a day (QID) | ORAL | Status: DC | PRN

## 2021-07-30 MED ORDER — ASPIRIN 81 MG PO TBEC
81.0000 mg | DELAYED_RELEASE_TABLET | Freq: Every day | ORAL | Status: DC
Start: 2021-07-30 — End: 2021-08-01
  Administered 2021-07-30 – 2021-08-01 (×3): 81 mg via ORAL
  Filled 2021-07-30 (×3): qty 1

## 2021-07-30 MED ORDER — METOCLOPRAMIDE HCL 5 MG/ML IJ SOLN
10.0000 mg | Freq: Once | INTRAMUSCULAR | Status: AC
  Administered 2021-07-30: 10 mg via INTRAVENOUS
  Filled 2021-07-30: qty 2

## 2021-07-30 MED ORDER — FENTANYL CITRATE PF 50 MCG/ML IJ SOSY
25.0000 ug | PREFILLED_SYRINGE | Freq: Once | INTRAMUSCULAR | Status: AC
  Administered 2021-07-30: 25 ug via INTRAVENOUS
  Filled 2021-07-30: qty 1

## 2021-07-30 NOTE — Assessment & Plan Note (Signed)
Recent A1c 8.5 on 07/18/2021, poorly controlled.  Patient is taking Victoza, metformin, Jardiance and glipizide -Sliding scale insulin

## 2021-07-30 NOTE — ED Triage Notes (Signed)
Patient c/o emesis, lower back pain and bilateral hip pain. Reports she had fall last Sunday and was admitted to [facility] for 3 days.

## 2021-07-30 NOTE — Assessment & Plan Note (Signed)
-   Patient's not taking Lipitor currently -Follow-up with PCP

## 2021-07-30 NOTE — Assessment & Plan Note (Signed)
See above

## 2021-07-30 NOTE — Assessment & Plan Note (Signed)
Severe lower back pain due to compression fracture of lumbar spine vertebral L1 and L2 as evidenced by MRI. Consulted Dr. Cari Caraway for neurosurgery.  -Placed on MedSurg bed for position -LSO is ordered by Dr. Cari Caraway -Lyrica 25 mg twice daily, -As needed morphine, Percocet -Lidoderm patch -As needed Robaxin

## 2021-07-30 NOTE — Assessment & Plan Note (Signed)
Nausea, vomiting and lower abdominal pain: Patient states she has intermittent lower abdominal pain since November 2022.  She still has lower abdominal pain.  On examination, abdominal is soft, no rebound pain.  Low suspicions for acute abdomen.  Etiology is not clear, likely multifactorial etiology, including pancreatic cyst, side effects of naproxen and aspirin.  Lipase 27.  -Supportive care -As needed Zofran -Patient is on multiple pain medications for lower back pain -Hold off naproxen -Patient is on Protonix -If patient gets worse, may consider CT of abdomen.

## 2021-07-30 NOTE — ED Notes (Signed)
Pt placed in hospital gown.

## 2021-07-30 NOTE — ED Notes (Signed)
Pt back from MRI, pt reports 10/10 hip pain

## 2021-07-30 NOTE — H&P (Signed)
History and Physical    Kerri Mccullough:[id number] DOB: [birth year] DOA: 07/30/2021  Referring MD/NP/PA:   PCP: Rutherford Limerick, PA   Patient coming from:  The patient is coming from home.  At baseline, pt is independent for most of ADL.        Chief Complaint: lower back pain  HPI: Kerri Mccullough is a 76 y.o. female with medical history significant of hypertension, hyperlipidemia, diabetes mellitus, GERD, depression, CAD, s/p of stent placement, squamous skin cancer, pancreatic mass, who presents with lower back pain.  Patient was recently hospitalized from 6/4 - 6/6 due to fall and lower back pain. Per discharge summery, "Imager of the L-spine demonstrated no evidence of acute abnormality and unchanged 75% compression fracture of the L1. X-ray of the right hip was also negative for fracture. CT pelvis was also negative except for degenerative changes are noted in the pubic symphysis and L-spine" . Pt patient continues to have severe pain, which is severe, constant, sharp, radiating to both hips posteriorly, left hip is worse than the right.  Patient also has left leg numbness.  No loss control for bowel movement currently.  Patient has chronic intermittent urinary incontinence.  No symptoms of UTI.  Patient is taking naproxen, Neurontin and Vicodin which does not relieve her pain.  The pain is so severe that she cannot walk normally.  Patient does not have cough, shortness breath or chest pain.  She states that she has been having intermittent mild lower abdominal pain since November 2022.  She still has mild lower abdominal pain. She also complains of nausea and nonbilious nonbloody vomiting, several times each day.  No diarrhea.  Last bowel movement was this morning.   Data Reviewed and ED Course: pt was found to have WBC 8.4, lipase 27, negative urinalysis, GFR> 60, temperature normal, blood pressure 179/72, heart rate 71, 59, RR 18, oxygen saturation 100% on room air.  Patient is  placed on MedSurg bed for position, Dr. Cari Caraway for neurosurgery is consulted.   MRI-L spin: 1. Acute to subacute compression fracture of the L2 vertebral body with up to approximately 40% loss of vertebral body height and mild bony retropulsion, worsened since 07/18/2021 when the fracture was likely acute. There is mild-to-moderate spinal canal stenosis without frank nerve root compression. 2. Unchanged chronic marked compression deformity of the L1 vertebral body with minimal bony retropulsion. 3. Multilevel degenerative changes as above without high-grade spinal canal or neural foraminal stenosis.    EKG:  Not done in ED, will get one.      Review of Systems:   General: no fevers, chills, no body weight gain, has poor appetite, has fatigue HEENT: no blurry vision, hearing changes or sore throat Respiratory: no dyspnea, coughing, wheezing CV: no chest pain, no palpitations GI: has nausea, vomiting, lower abdominal pain, no diarrhea, constipation GU: no dysuria, burning on urination, increased urinary frequency, hematuria  Ext: no leg edema Neuro: no vision change or hearing loss. Has numbness in left leg Skin: no rash, no skin tear. MSK: Has lower back pain and bilateral hip pain Heme: No easy bruising.  Travel history: No recent long distant travel.   Allergy:  Allergies  Allergen Reactions   Ace Inhibitors Swelling   Rosiglitazone Other (See Comments)    Other Reaction: SOB, Pulmonary Edema Other Reaction: SOB, Pulmonary Edema Other Reaction: SOB, Pulmonary Edema    Aspirin Swelling    When taken with a beta blocker    Golytely [Peg  3350-Electrolytes] Swelling    Past Medical History:  Diagnosis Date   Actinic keratosis    Diabetes mellitus without complication (HCC)    GERD (gastroesophageal reflux disease)    Heart attack ([place])    High cholesterol    Hypertension    Squamous cell carcinoma of skin    R cheek - treated at [facility]    Past Surgical  History:  Procedure Laterality Date   ABDOMINAL HYSTERECTOMY     COLONOSCOPY WITH PROPOFOL N/A 10/14/2019   Procedure: COLONOSCOPY WITH PROPOFOL;  Surgeon: Lesly Rubenstein, MD;  Location: [facility] ENDOSCOPY;  Service: Endoscopy;  Laterality: N/A;   CORONARY ANGIOPLASTY WITH STENT PLACEMENT     ESOPHAGOGASTRODUODENOSCOPY (EGD) WITH PROPOFOL N/A 10/14/2019   Procedure: ESOPHAGOGASTRODUODENOSCOPY (EGD) WITH PROPOFOL;  Surgeon: Lesly Rubenstein, MD;  Location: [facility] ENDOSCOPY;  Service: Endoscopy;  Laterality: N/A;   LEFT HEART CATH AND CORONARY ANGIOGRAPHY N/A 05/07/2018   Procedure: LEFT HEART CATH AND CORONARY ANGIOGRAPHY with possible PCI and stent;  Surgeon: Yolonda Kida, MD;  Location: [place] [facility];  Service: Cardiovascular;  Laterality: N/A;    Social History:  reports that she has quit smoking. Her smoking use included cigarettes. She has never used smokeless tobacco. She reports that she does not drink alcohol and does not use drugs.  Family History:  Family History  Problem Relation Age of Onset   Diabetes Mother    Heart failure Mother    Lung cancer Father    Lung cancer Sister    Lung cancer Brother      Prior to Admission medications   Medication Sig Start Date End Date Taking? Authorizing Provider  alendronate (FOSAMAX) 70 MG tablet Take 1 tablet by mouth once a week. 01/28/19   [provider]  amLODipine (NORVASC) 5 MG tablet Take 5 mg by mouth 2 (two) times daily. 04/05/18   [provider]  aspirin EC 81 MG tablet Take 81 mg by mouth daily. 11/09/11   [provider]  atorvastatin (LIPITOR) 20 MG tablet Take 20 mg by mouth daily. 06/28/21   [provider]  carvedilol (COREG) 6.25 MG tablet Take 1 tablet (6.25 mg total) by mouth 2 (two) times daily with a meal. 07/20/21   Swayze, Ava, DO  citalopram (CELEXA) 20 MG tablet Take 20 mg by mouth daily. 04/05/18   [provider]  gabapentin (NEURONTIN) 300 MG capsule Take  300 mg by mouth 3 (three) times daily. 04/16/21   [provider]  glipiZIDE (GLUCOTROL XL) 5 MG 24 hr tablet Take 5 mg by mouth daily. 06/19/21   [provider]  JARDIANCE 25 MG TABS tablet Take 25 mg by mouth daily. 07/02/21   [provider]  metFORMIN (GLUCOPHAGE) 1000 MG tablet Take 1 tablet by mouth 2 (two) times daily. 08/24/19   [provider]  naproxen (NAPROSYN) 500 MG tablet Take 1 tablet (500 mg total) by mouth 2 (two) times daily with a meal. 07/20/21 07/20/22  Swayze, Ava, DO  nitroGLYCERIN (NITROSTAT) 0.4 MG SL tablet Place under the tongue. 05/07/18   [provider]  omeprazole (PRILOSEC) 40 MG capsule Take 40 mg by mouth daily. 04/05/18   [provider]  VICTOZA 18 MG/3ML SOPN Inject 0.6 mg into the skin daily. 04/19/18   [provider]    Physical Exam: Vitals:   07/30/21 1109 07/30/21 1240 07/30/21 1359 07/30/21 1400  BP:  (!) 156/73 (!) 178/63 (!) 179/72  Pulse:  65 (!)  58 (!) 59  Resp:  17 18   Temp:      TempSrc:      SpO2:  100% 100% 100%  Weight: 78.9 kg     Height: '5\' 4"'$  (1.626 m)      General: Not in acute distress HEENT:       Eyes: PERRL, EOMI, no scleral icterus.       ENT: No discharge from the ears and nose, no pharynx injection, no tonsillar enlargement.        Neck: No JVD, no bruit, no mass felt. Heme: No neck lymph node enlargement. Cardiac: S1/S2, RRR, No murmurs, No gallops or rubs. Respiratory: No rales, wheezing, rhonchi or rubs. GI: Soft, nondistended, has mild tenderness in lower abdomen, no rebound pain, no organomegaly, BS present. GU: No hematuria Ext: No pitting leg edema bilaterally. 1+DP/PT pulse bilaterally. Musculoskeletal: Has tenderness in midline of lower back Skin: No rashes.  Neuro: Alert, oriented X3, cranial nerves II-XII grossly intact, moves all extremities  Psych: Patient is not psychotic, no suicidal or hemocidal ideation.  Labs on Admission: I have personally  reviewed following labs and imaging studies  CBC: Recent Labs  Lab 07/30/21 1112  WBC 8.4  HGB 12.2  HCT 40.7  MCV 81.6  PLT 545   Basic Metabolic Panel: Recent Labs  Lab 07/30/21 1112  NA 136  K 4.1  CL 103  CO2 24  GLUCOSE 166*  BUN 12  CREATININE 0.98  CALCIUM 9.1   GFR: Estimated Creatinine Clearance: 51.2 mL/min (by C-G formula based on SCr of 0.98 mg/dL). Liver Function Tests: Recent Labs  Lab 07/30/21 1112  AST 21  ALT 17  ALKPHOS 63  BILITOT 0.8  PROT 8.6*  ALBUMIN 4.0   Recent Labs  Lab 07/30/21 1112  LIPASE 27   No results for input(s): "AMMONIA" in the last 168 hours. Coagulation Profile: No results for input(s): "INR", "PROTIME" in the last 168 hours. Cardiac Enzymes: No results for input(s): "CKTOTAL", "CKMB", "CKMBINDEX", "TROPONINI" in the last 168 hours. BNP (last 3 results) No results for input(s): "PROBNP" in the last 8760 hours. HbA1C: No results for input(s): "HGBA1C" in the last 72 hours. CBG: No results for input(s): "GLUCAP" in the last 168 hours. Lipid Profile: No results for input(s): "CHOL", "HDL", "LDLCALC", "TRIG", "CHOLHDL", "LDLDIRECT" in the last 72 hours. Thyroid Function Tests: No results for input(s): "TSH", "T4TOTAL", "FREET4", "T3FREE", "THYROIDAB" in the last 72 hours. Anemia Panel: No results for input(s): "VITAMINB12", "FOLATE", "FERRITIN", "TIBC", "IRON", "RETICCTPCT" in the last 72 hours. Urine analysis:    Component Value Date/Time   COLORURINE YELLOW (A) 07/30/2021 1112   APPEARANCEUR CLEAR (A) 07/30/2021 1112   LABSPEC 1.027 07/30/2021 1112   PHURINE 5.0 07/30/2021 1112   GLUCOSEU >=500 (A) 07/30/2021 1112   HGBUR NEGATIVE 07/30/2021 1112   BILIRUBINUR NEGATIVE 07/30/2021 1112   KETONESUR NEGATIVE 07/30/2021 1112   PROTEINUR 30 (A) 07/30/2021 1112   NITRITE NEGATIVE 07/30/2021 1112   LEUKOCYTESUR NEGATIVE 07/30/2021 1112   Sepsis Labs: '@LABRCNTIP'$ (procalcitonin:4,lacticidven:4) )No results found  for this or any previous visit (from the past 240 hour(s)).   Radiological Exams on Admission: MR LUMBAR SPINE WO CONTRAST  Result Date: 07/30/2021 CLINICAL DATA:  Low back pain bilateral hip pain, fall last Sunday EXAM: MRI LUMBAR SPINE WITHOUT CONTRAST TECHNIQUE: Multiplanar, multisequence MR imaging of the lumbar spine was performed. No intravenous contrast was administered. COMPARISON:  CT abdomen/pelvis 07/18/2021 FINDINGS: Segmentation: Standard; the lowest formed disc space is designated L5-S1.  Alignment:  There is no significant antero or retrolisthesis. Vertebrae: There is severe compression deformity of the L1 vertebral body with up to approximately 70-80% loss of vertebral body height, similar to the prior CT from 07/18/2021, and not significantly changed since 2020. There is mild bony retropulsion resulting in mild spinal canal stenosis without conus/cauda equina nerve root compression. There is an acute to subacute compression fracture of the L2 vertebral body with up to approximately 40% loss of vertebral body height, worsened since 07/18/2021. Bony retropulsion at this level is new since 07/18/2021 resulting in mild-to-moderate spinal canal stenosis with crowding of the subarticular zones but no frank nerve root compression. The other vertebral body heights are preserved, without other acute fracture. Background marrow signal is within normal limits. There is no suspicious marrow signal abnormality. Conus medullaris and cauda equina: Conus extends to the L1 level. Conus and cauda equina appear normal. Paraspinal and other soft tissues: Unremarkable. Disc levels: T12-L1: As above, there is mild bony retropulsion resulting in mild spinal canal stenosis without significant neural foraminal stenosis L2-L3: As above, there is bony retropulsion resulting in mild-to-moderate spinal canal stenosis with crowding of the subarticular zones but no significant neural foraminal stenosis L2-L3: No significant  spinal canal or neural foraminal stenosis L3-L4: Mild disc bulge eccentric to the left and bilateral facet arthropathy results in mild left worse than right neural foraminal stenosis without significant spinal canal stenosis. L4-L5: There is a mild disc bulge and bilateral facet arthropathy resulting in mild left worse than right neural foraminal stenosis without significant spinal canal stenosis L5-S1: There is a mild disc bulge eccentric to the left with a central annular fissure and bilateral facet arthropathy resulting in mild-to-moderate left and mild right neural foraminal stenosis without significant spinal canal stenosis. IMPRESSION: 1. Acute to subacute compression fracture of the L2 vertebral body with up to approximately 40% loss of vertebral body height and mild bony retropulsion, worsened since 07/18/2021 when the fracture was likely acute. There is mild-to-moderate spinal canal stenosis without frank nerve root compression. 2. Unchanged chronic marked compression deformity of the L1 vertebral body with minimal bony retropulsion. 3. Multilevel degenerative changes as above without high-grade spinal canal or neural foraminal stenosis. Electronically Signed   By: Valetta Mole M.D.   On: 07/30/2021 14:06      Assessment/Plan Principal Problem:   Compression fracture of lumbar vertebra (HCC) Active Problems:   Lower back pain   Diabetes mellitus without complication (HCC)   Hypertension   Hyperlipidemia   CAD (coronary artery disease)   Pancreatic mass   Nausea & vomiting   Lower abdominal pain   Principal Problem:   Compression fracture of lumbar vertebra (HCC) Active Problems:   Lower back pain   Diabetes mellitus without complication (HCC)   Hypertension   Hyperlipidemia   CAD (coronary artery disease)   Pancreatic mass   Nausea & vomiting   Lower abdominal pain   Assessment and Plan: * Compression fracture of lumbar vertebra (HCC) Severe lower back pain due to  compression fracture of lumbar spine vertebral L1 and L2 as evidenced by MRI. Consulted Dr. Cari Caraway for neurosurgery.  -Placed on MedSurg bed for position -LSO is ordered by Dr. Cari Caraway -Lyrica 25 mg twice daily, -As needed morphine, Percocet -Lidoderm patch -As needed Robaxin      Lower back pain - See above  Diabetes mellitus without complication ([place]) Recent A1c 8.5 on 07/18/2021, poorly controlled.  Patient is taking Victoza, metformin, Jardiance and glipizide -Sliding  scale insulin  Hypertension - IV hydralazine as needed -Continue home amlodipine, Coreg  Hyperlipidemia - Patient's not taking Lipitor currently -Follow-up with PCP  CAD (coronary artery disease) No chest pain.  S/p of stent placement -Continue aspirin -As needed nitroglycerin  Pancreatic mass Per recent discharge summary, MRCP on 6/5 showed "multilobular cystic complex in the head of the pancrease and has no specific worrisome characteristics." Dr. Allen Norris of GI recommended follow up with GI as outpatient.  Nausea & vomiting Nausea, vomiting and lower abdominal pain: Patient states she has intermittent lower abdominal pain since November 2022.  She still has lower abdominal pain.  On examination, abdominal is soft, no rebound pain.  Low suspicions for acute abdomen.  Etiology is not clear, likely multifactorial etiology, including pancreatic cyst, side effects of naproxen and aspirin.  Lipase 27.  -Supportive care -As needed Zofran -Patient is on multiple pain medications for lower back pain -Hold off naproxen -Patient is on Protonix -If patient gets worse, may consider CT of abdomen.   Lower abdominal pain -See above             DVT ppx: SQ Heparin    Code Status: DNR per pt and her daughter  Family Communication: Yes, patient's daughter   at bed side.    Disposition Plan:  Anticipate discharge back to previous environment  Consults called:  Dr. Cari Caraway for  neurosurgery  Admission status and Level of care: Med-Surg:    for obs     Severity of Illness:  The appropriate patient status for this patient is OBSERVATION. Observation status is judged to be reasonable and necessary in order to provide the required intensity of service to ensure the patient's safety. The patient's presenting symptoms, physical exam findings, and initial radiographic and laboratory data in the context of their medical condition is felt to place them at decreased risk for further clinical deterioration. Furthermore, it is anticipated that the patient will be medically stable for discharge from the hospital within 2 midnights of admission.        Date of Service 07/30/2021    Ivor Costa [facility]   If 7PM-7AM, please contact night-coverage [email or web address] 07/30/2021, 4:29 PM

## 2021-07-30 NOTE — ED Provider Notes (Signed)
[facility] Provider Note    Event Date/Time   First MD Initiated Contact with Patient 07/30/21 1157     (approximate)   History   Emesis and Back Pain   HPI  Kerri Mccullough is a 76 y.o. female with history of CAD, diabetes, hypertension presents to the emergency department for treatment and evaluation of emesis, lower back pain, and bilateral hip pain.  She was admitted here after a fall due to intractable pain.  She states that since going home, she has continued to have significant back pain that radiates into her legs.No new falls/injury.  She is also unable to tolerate any food.  She states that she vomits after she eats.  She denies abdominal pain.  She is unsure if the emesis is related to pain or something else.  She is taking Vicodin for pain but vomits after taking it so is not doing her any good.  Past Medical History:  Diagnosis Date   Actinic keratosis    Diabetes mellitus without complication (HCC)    GERD (gastroesophageal reflux disease)    Heart attack (HCC)    High cholesterol    Hypertension    Squamous cell carcinoma of skin    R cheek - treated at [facility]     Physical Exam   Triage Vital Signs: ED Triage Vitals  Enc Vitals Group     BP 07/30/21 1108 129/87     Pulse Rate 07/30/21 1108 71     Resp 07/30/21 1108 18     Temp 07/30/21 1108 98.2 F (36.8 C)     Temp Source 07/30/21 1108 Oral     SpO2 07/30/21 1108 93 %     Weight 07/30/21 1109 174 lb (78.9 kg)     Height 07/30/21 1109 '5\' 4"'$  (1.626 m)     Head Circumference --      Peak Flow --      Pain Score 07/30/21 1108 8     Pain Loc --      Pain Edu? --      Excl. in [place]? --     Most recent vital signs: Vitals:   07/30/21 1730 07/30/21 1834  BP: (!) 160/69 (!) 180/69  Pulse: 77 (!) 59  Resp:  17  Temp:  98.2 F (36.8 C)  SpO2: 96% 97%    General: Awake, no distress.  CV:  Good peripheral perfusion.  Resp:  Normal effort.  Abd:  No distention.   Other:  Motor and sensory function of extremities intact.    ED Results / Procedures / Treatments   Labs (all labs ordered are listed, but only abnormal results are displayed) Labs Reviewed  COMPREHENSIVE METABOLIC PANEL - Abnormal; Notable for the following components:      Result Value   Glucose, Bld 166 (*)    Total Protein 8.6 (*)    All other components within normal limits  CBC - Abnormal; Notable for the following components:   MCH 24.4 (*)    RDW 18.4 (*)    All other components within normal limits  URINALYSIS, ROUTINE W REFLEX MICROSCOPIC - Abnormal; Notable for the following components:   Color, Urine YELLOW (*)    APPearance CLEAR (*)    Glucose, UA >=500 (*)    Protein, ur 30 (*)    All other components within normal limits  CBG MONITORING, ED - Abnormal; Notable for the following components:   Glucose-Capillary 104 (*)    All  other components within normal limits  LIPASE, BLOOD  PROTIME-INR  APTT     EKG  Not indicated.   RADIOLOGY  MRI of the lumbar spine shows L2 compression fracture with approximately 40% height loss and mild fragment retropulsion.  I have independently reviewed and interpreted imaging as well as reviewed report from radiology.  PROCEDURES:  Critical Care performed: No  Procedures   MEDICATIONS ORDERED IN ED:  Medications  morphine (PF) 2 MG/ML injection 2 mg (2 mg Intravenous Given 07/30/21 1754)  pregabalin (LYRICA) capsule 25 mg (25 mg Oral Given 07/30/21 1647)  oxyCODONE-acetaminophen (PERCOCET/ROXICET) 5-325 MG per tablet 1 tablet (has no administration in time range)  methocarbamol (ROBAXIN) tablet 500 mg (has no administration in time range)  lidocaine (LIDODERM) 5 % 1 patch (1 patch Transdermal Patch Applied 07/30/21 1647)  ondansetron (ZOFRAN) injection 4 mg (4 mg Intravenous Given 07/30/21 1754)  acetaminophen (TYLENOL) tablet 650 mg (has no administration in time range)  hydrALAZINE (APRESOLINE) injection 5 mg (5  mg Intravenous Given 07/30/21 1850)  insulin aspart (novoLOG) injection 0-9 Units ( Subcutaneous Not Given 07/30/21 1643)  insulin aspart (novoLOG) injection 0-5 Units (has no administration in time range)  heparin injection 5,000 Units (5,000 Units Subcutaneous Given 07/30/21 1647)  aspirin EC tablet 81 mg (81 mg Oral Given 07/30/21 1646)  amLODipine (NORVASC) tablet 5 mg (has no administration in time range)  carvedilol (COREG) tablet 6.25 mg (6.25 mg Oral Given 07/30/21 1647)  nitroGLYCERIN (NITROSTAT) SL tablet 0.4 mg (has no administration in time range)  citalopram (CELEXA) tablet 20 mg (has no administration in time range)  pantoprazole (PROTONIX) EC tablet 40 mg (has no administration in time range)  ondansetron (ZOFRAN-ODT) disintegrating tablet 4 mg (4 mg Oral Given 07/30/21 1114)  sodium chloride 0.9 % bolus 500 mL (0 mLs Intravenous Stopped 07/30/21 1359)  ondansetron (ZOFRAN) injection 4 mg (4 mg Intravenous Given 07/30/21 1235)  fentaNYL (SUBLIMAZE) injection 25 mcg (25 mcg Intravenous Given 07/30/21 1236)  morphine (PF) 2 MG/ML injection 2 mg (2 mg Intravenous Given 07/30/21 1514)  metoCLOPramide (REGLAN) injection 10 mg (10 mg Intravenous Given 07/30/21 1529)     IMPRESSION / MDM / ASSESSMENT AND PLAN / ED COURSE   I reviewed the triage vital signs and the nursing notes.  Differential diagnosis includes, but is not limited to: Compression fracture lumbar spine, hip fracture, fracture of the pelvic ring, neuropathy  Patient's presentation is most consistent with acute presentation with potential threat to life or bodily function.  The patient is on the cardiac monitor to evaluate for evidence of arrhythmia and/or significant heart rate changes.  76 year old female presenting to the emergency department for treatment and evaluation of emesis and bilateral hip pain.  Patient was admitted on June 4 due to intractable pain after a mechanical, nonsyncopal fall where she hit her head and  her lower back.  Pain is not improving.  Patient states that she is able to walk from her bedroom to the bathroom without assistance, but pain shoots from her lower back and down both legs. She denies loss of bowel or bladder control.   Clinical Course as of 07/30/21 1901  Fri Jul 30, 2021  1458 Consulted Dr. Wayman Hoard Caraway who recommends admission for pain control and LSO bracing. He will consult. [CT]  1520 Patient requesting additional pain and nausea medication. Morphine and reglan ordered. Oxygen saturations are remaining in the high 90s.  Hospitalist consult requested for admission. Patient and family agreeable to the plan. [CT]  Clinical Course User Index [CT] Ayleah Hofmeister B, FNP     FINAL CLINICAL IMPRESSION(S) / ED DIAGNOSES   Final diagnoses:  Compression fracture of L2 vertebra, initial encounter ([place])  Intractable low back pain     Rx / DC Orders   ED Discharge Orders     None        Note:  This document was prepared using Dragon voice recognition software and may include unintentional dictation errors.   Victorino Dike, FNP 07/30/21 Lenoria Farrier, MD 07/31/21 2325

## 2021-07-30 NOTE — Assessment & Plan Note (Signed)
-   IV hydralazine as needed -Continue home amlodipine, Coreg

## 2021-07-30 NOTE — ED Notes (Signed)
Pt back from mri

## 2021-07-30 NOTE — Assessment & Plan Note (Signed)
No chest pain.  S/p of stent placement -Continue aspirin -As needed nitroglycerin

## 2021-07-30 NOTE — Assessment & Plan Note (Signed)
Per recent discharge summary, MRCP on 6/5 showed "multilobular cystic complex in the head of the pancrease and has no specific worrisome characteristics." Dr. Allen Norris of GI recommended follow up with GI as outpatient.

## 2021-07-31 DIAGNOSIS — R1030 Lower abdominal pain, unspecified: Secondary | ICD-10-CM | POA: Diagnosis not present

## 2021-07-31 DIAGNOSIS — R112 Nausea with vomiting, unspecified: Secondary | ICD-10-CM | POA: Diagnosis not present

## 2021-07-31 DIAGNOSIS — S32000A Wedge compression fracture of unspecified lumbar vertebra, initial encounter for closed fracture: Secondary | ICD-10-CM | POA: Diagnosis not present

## 2021-07-31 DIAGNOSIS — S32028A Other fracture of second lumbar vertebra, initial encounter for closed fracture: Secondary | ICD-10-CM | POA: Diagnosis not present

## 2021-07-31 LAB — GLUCOSE, CAPILLARY
Glucose-Capillary: 193 mg/dL — ABNORMAL HIGH (ref 70–99)
Glucose-Capillary: 145 mg/dL — ABNORMAL HIGH (ref 70–99)
Glucose-Capillary: 221 mg/dL — ABNORMAL HIGH (ref 70–99)
Glucose-Capillary: 171 mg/dL — ABNORMAL HIGH (ref 70–99)

## 2021-07-31 MED ORDER — HYDROCORTISONE 1 % EX CREA
TOPICAL_CREAM | Freq: Three times a day (TID) | CUTANEOUS | Status: DC | PRN
  Filled 2021-07-31: qty 28

## 2021-07-31 MED ORDER — SENNOSIDES-DOCUSATE SODIUM 8.6-50 MG PO TABS
2.0000 | ORAL_TABLET | Freq: Two times a day (BID) | ORAL | Status: DC
  Administered 2021-07-31 – 2021-08-01 (×3): 2 via ORAL
  Filled 2021-07-31 (×3): qty 2

## 2021-07-31 NOTE — Progress Notes (Signed)
  Progress Note   Patient: Kerri Mccullough GBT:[id number] DOB: 12-14-1945 DOA: 07/30/2021     0 DOS: the patient was seen and examined on 07/31/2021   Brief hospital course: Kerri Mccullough is a 76 y.o. female with medical history significant of hypertension, hyperlipidemia, diabetes mellitus, GERD, depression, CAD, s/p of stent placement, squamous skin cancer, pancreatic mass, who presents with lower back pain. CT scan showed L1 compression fracture, has been seen by neurosurgery, no indication for surgery.  Patient is placed on LSO brace, pain medicine.  Assessment and Plan: Intractable lower back pain with compression fracture of L1 and L2. Patient still has significant back pain, continue pain medicine.  LSO placed. Continue current pain medicine, Lidoderm patch. Obtain PT evaluation.  Constipation. Nausea vomiting. Continue symptomatic treatment, start a stool softener.  Nausea vomiting is better today.  Type 2 diabetes Continue sliding scale insulin.  Essential hypertension. Coronary artery disease Continue current treatment.  Pancreatic mass. Follow-up with Dr. Allen Norris as outpatient.     Subjective:  Patient still complaining significant pain with any movement.  She has a chronic incontinence of the urine, nausea worse.  She has constipation.  Physical Exam: Vitals:   07/30/21 1834 07/30/21 2030 07/31/21 0535 07/31/21 0836  BP: (!) 180/69 137/66 (!) 137/57 (!) 127/58  Pulse: (!) 59 67 61 64  Resp: '17 16 16 16  '$ Temp: 98.2 F (36.8 C) 97.8 F (36.6 C) 98 F (36.7 C) 98 F (36.7 C)  TempSrc:  Oral Oral   SpO2: 97% 94% 93% 93%  Weight:      Height:       General exam: Appears calm and comfortable  Respiratory system: Clear to auscultation. Respiratory effort normal. Cardiovascular system: S1 & S2 heard, RRR. No JVD, murmurs, rubs, gallops or clicks. No pedal edema. Gastrointestinal system: Abdomen is nondistended, soft and nontender. No organomegaly or masses  felt. Normal bowel sounds heard. Central nervous system: Alert and oriented. No focal neurological deficits. Extremities: Symmetric 5 x 5 power. Skin: No rashes, lesions or ulcers Psychiatry: Judgement and insight appear normal. Mood & affect appropriate.  Lumbar spinal tenderness Data Reviewed:  Reviewed MRI results and lab results.  Family Communication: not able to reach son  Disposition: Status is: Observation   Planned Discharge Destination: Home with Home Health    Time spent: 35 minutes  Author: Sharen Hones, MD 07/31/2021 12:39 PM  For on call review [email or web address].

## 2021-07-31 NOTE — Evaluation (Signed)
Occupational Therapy Evaluation Patient Details Name: Kerri Mccullough MRN: [id number] DOB: Oct 28, 1945 Today's Date: 07/31/2021   History of Present Illness Pt is a 76 y/o F admitted on 07/30/21 after presenting with c/o LBP. Pt was recently hospitalized 6/4-6/6 2/2 fall & LBP. Pt continues to have severe pain that radiates to B hips, L worse than R. MRI of L spine shows "Acute to subacute compression fracture of the L2 vertebral body with up to approximately 40% loss of vertebral body height and mild bony retropulsion, worsened since 07/18/2021 when the fracture was likely acute. There is mild-to-moderate spinal canal stenosis without frank nerve root compression." Neurosurgery was consulted with recommendations for no surgical intervention but use of LSO brace for ambulation. PMH: HTN, HLD, DM, GERD, depression, CAD, s/p stent placement, squamous skin CA, pancreatic mass   Clinical Impression   Pt seen for OT evaluation this date. Prior to hospital admission, pt was independent with mobility, ADL, and IADL. However, has been having increased difficulty with all aspects of mobility and ADL due to back pain. Pt lives alone spouse in a single family home with a ramped entrance and supportive family able to provide 24/7 assist/support as needed for pt. Pt currently requires MIN A for LB ADL management and to don lumbar corset. She is able to transfer to the room commode with supervision for safety. Pt would benefit from skilled OT services to address noted impairments and functional limitations (see below for any additional details) in order to maximize safety and independence while minimizing falls risk and caregiver burden. Do not anticipate the need for follow up OT services upon hospital DC.      Recommendations for follow up therapy are one component of a multi-disciplinary discharge planning process, led by the attending physician.  Recommendations may be updated based on patient status, additional  functional criteria and insurance authorization.   Follow Up Recommendations  No OT follow up    Assistance Recommended at Discharge Frequent or constant Supervision/Assistance  Patient can return home with the following A little help with walking and/or transfers;A lot of help with bathing/dressing/bathroom;Assistance with cooking/housework;Assist for transportation;Direct supervision/assist for medications management;Help with stairs or ramp for entrance    Functional Status Assessment  Patient has had a recent decline in their functional status and demonstrates the ability to make significant improvements in function in a reasonable and predictable amount of time.  Equipment Recommendations  Tub/shower bench    Recommendations for Other Services       Precautions / Restrictions Precautions Precautions: Fall;Back Required Braces or Orthoses: Spinal Brace Spinal Brace: Lumbar corset (when OOB) Restrictions Weight Bearing Restrictions: No      Mobility Bed Mobility Overal bed mobility: Needs Assistance Bed Mobility: Rolling, Sidelying to Sit Rolling: Supervision Sidelying to sit: Supervision       General bed mobility comments: cuing for log rolling technique to maintain back precautions    Transfers Overall transfer level: Needs assistance Equipment used: 1 person hand held assist Transfers: Sit to/from Stand Sit to Stand: Modified independent (Device/Increase time)                  Balance Overall balance assessment: Needs assistance Sitting-balance support: No upper extremity supported, Feet supported Sitting balance-Leahy Scale: Normal Sitting balance - Comments: steady sitting reaching outside BOS   Standing balance support: During functional activity, Bilateral upper extremity supported Standing balance-Leahy Scale: Good Standing balance comment: steady ambulating with RW use  ADL either performed or assessed with  clinical judgement   ADL Overall ADL's : Needs assistance/impaired                                       General ADL Comments: MIN A to don bilat shoes, MIN A to don lumbar corset, SUPERVISION for toilet transfer during session. Educated on AE to maximize safety and comfort with LB dressing.     Vision Patient Visual Report: No change from baseline       Perception     Praxis      Pertinent Vitals/Pain Pain Assessment Pain Assessment: 0-10 Pain Score: 5  Pain Location: "across my back & my knees" Pain Descriptors / Indicators: Discomfort, Aching, Sore Pain Intervention(s): Limited activity within patient's tolerance, Premedicated before session, Repositioned     Hand Dominance Right   Extremity/Trunk Assessment Upper Extremity Assessment Upper Extremity Assessment: Overall WFL for tasks assessed   Lower Extremity Assessment Lower Extremity Assessment: Generalized weakness;Defer to PT evaluation   Cervical / Trunk Assessment Cervical / Trunk Assessment:  (LSO brace donned sitting EOB)   Communication Communication Communication: No difficulties   Cognition Arousal/Alertness: Awake/alert Behavior During Therapy: WFL for tasks assessed/performed Overall Cognitive Status: Within Functional Limits for tasks assessed                                       General Comments       Exercises Other Exercises Other Exercises: OT educated pt on back precautions & need to wear LSO when OOB; AE/DME to support safety and functional independence, and back precautions for improved comfort and safety with ADL management.   Shoulder Instructions      Home Living Family/patient expects to be discharged to:: Private residence Living Arrangements: Alone Available Help at Discharge: Family;Available 24 hours/day Type of Home: House Home Access: Ramped entrance     Home Layout: One level     Bathroom Shower/Tub: Tub/shower unit;Tub only    Biochemist, clinical: Standard     Home Equipment: Public relations account executive (2 wheels);Cane - single point Media planner)   Additional Comments: Daughter [name]) has been & will be providing 24 hr supervision at d/c.      Prior Functioning/Environment Prior Level of Function : Independent/Modified Independent             Mobility Comments: Pt was independent without AD prior to last fall, has been using RW since last admission.          OT Problem List: Pain;Decreased activity tolerance;Impaired balance (sitting and/or standing);Decreased knowledge of use of DME or AE      OT Treatment/Interventions: Self-care/ADL training;Therapeutic exercise;Therapeutic activities;DME and/or AE instruction;Patient/family education;Balance training;Energy conservation    OT Goals(Current goals can be found in the care plan section) Acute Rehab OT Goals Patient Stated Goal: to go home OT Goal Formulation: With patient/family Time For Goal Achievement: 08/14/21 Potential to Achieve Goals: Good ADL Goals Pt Will Perform Lower Body Dressing: with modified independence;sit to/from stand;with adaptive equipment Pt Will Transfer to Toilet: regular height toilet;ambulating;with modified independence Pt Will Perform Toileting - Clothing Manipulation and hygiene: sit to/from stand;with modified independence;with adaptive equipment  OT Frequency: Min 2X/week    Co-evaluation              AM-PAC OT "6 Clicks" Daily  Activity     Outcome Measure Help from another person eating meals?: None Help from another person taking care of personal grooming?: A Little Help from another person toileting, which includes using toliet, bedpan, or urinal?: A Little Help from another person bathing (including washing, rinsing, drying)?: A Little Help from another person to put on and taking off regular upper body clothing?: A Little Help from another person to put on and taking off regular lower body clothing?: A  Little 6 Click Score: 19   End of Session Equipment Utilized During Treatment: Rolling walker (2 wheels);Gait belt  Activity Tolerance: Patient tolerated treatment well Patient left: with family/visitor present (on commode with call bell in reach)  OT Visit Diagnosis: Other abnormalities of gait and mobility (R26.89);Pain Pain - part of body:  (Back)                Time: 0315-9458 OT Time Calculation (min): 29 min Charges:  OT General Charges $OT Visit: 1 Visit OT Evaluation $OT Eval Moderate Complexity: 1 Mod OT Treatments $Self Care/Home Management : 8-22 mins  Shara Blazing, M.S., OTR/L Ascom: [phone] 07/31/21, 4:09 PM

## 2021-07-31 NOTE — Hospital Course (Signed)
Kerri Mccullough is a 76 y.o. female with medical history significant of hypertension, hyperlipidemia, diabetes mellitus, GERD, depression, CAD, s/p of stent placement, squamous skin cancer, pancreatic mass, who presents with lower back pain. CT scan showed L1 compression fracture, has been seen by neurosurgery, no indication for surgery.  Patient is placed on LSO brace, pain medicine.

## 2021-07-31 NOTE — Progress Notes (Signed)
Orthopedic Tech Progress Note Patient Details:  Kerri Mccullough 11/15/1945 [id number] LSO Brace has been ordered from [facility]  Patient ID: Kerri Mccullough, female   DOB: 25-Oct-1945, 76 y.o.   MRN: [id number]  Kerri Mccullough 07/31/2021, 7:58 AM

## 2021-07-31 NOTE — Consult Note (Signed)
Referring Physician:  No referring provider defined for this encounter.  Primary Physician:  Rutherford Limerick, PA  Chief Complaint:  compression fracture  History of Present Illness: 07/31/2021 Kerri Mccullough is a 76 y.o. female who presents with the chief complaint of lowe back pain after a fall.  She was admitted for continued pain into her back and hips.  She has no other neurological symptoms.   Review of Systems:  A 10 point review of systems is negative, except for the pertinent positives and negatives detailed in the HPI.  Past Medical History: Past Medical History:  Diagnosis Date   Actinic keratosis    Diabetes mellitus without complication (HCC)    GERD (gastroesophageal reflux disease)    Heart attack ([place])    High cholesterol    Hypertension    Squamous cell carcinoma of skin    R cheek - treated at [facility]    Past Surgical History: Past Surgical History:  Procedure Laterality Date   ABDOMINAL HYSTERECTOMY     COLONOSCOPY WITH PROPOFOL N/A 10/14/2019   Procedure: COLONOSCOPY WITH PROPOFOL;  Surgeon: Lesly Rubenstein, MD;  Location: [facility] ENDOSCOPY;  Service: Endoscopy;  Laterality: N/A;   CORONARY ANGIOPLASTY WITH STENT PLACEMENT     ESOPHAGOGASTRODUODENOSCOPY (EGD) WITH PROPOFOL N/A 10/14/2019   Procedure: ESOPHAGOGASTRODUODENOSCOPY (EGD) WITH PROPOFOL;  Surgeon: Lesly Rubenstein, MD;  Location: [facility] ENDOSCOPY;  Service: Endoscopy;  Laterality: N/A;   LEFT HEART CATH AND CORONARY ANGIOGRAPHY N/A 05/07/2018   Procedure: LEFT HEART CATH AND CORONARY ANGIOGRAPHY with possible PCI and stent;  Surgeon: Yolonda Kida, MD;  Location: [place] [facility];  Service: Cardiovascular;  Laterality: N/A;    Allergies: Allergies as of 07/30/2021 - Review Complete 07/30/2021  Allergen Reaction Noted   Ace inhibitors Swelling 06/02/2012   Rosiglitazone Other (See Comments) 11/05/2014   Aspirin Swelling 05/05/2018   Golytely [peg 3350-electrolytes] Swelling  05/05/2018    Medications:  Current Facility-Administered Medications:    acetaminophen (TYLENOL) tablet 650 mg, 650 mg, Oral, Q6H PRN, Ivor Costa, MD   amLODipine (NORVASC) tablet 5 mg, 5 mg, Oral, BID, Ivor Costa, MD, 5 mg at 07/30/21 2121   aspirin EC tablet 81 mg, 81 mg, Oral, Daily, Ivor Costa, MD, 81 mg at 07/30/21 1646   carvedilol (COREG) tablet 6.25 mg, 6.25 mg, Oral, BID WC, Ivor Costa, MD, 6.25 mg at 07/30/21 1647   citalopram (CELEXA) tablet 20 mg, 20 mg, Oral, Daily, Ivor Costa, MD   heparin injection 5,000 Units, 5,000 Units, Subcutaneous, Q8H, Ivor Costa, MD, 5,000 Units at 07/30/21 2327   hydrALAZINE (APRESOLINE) injection 5 mg, 5 mg, Intravenous, Q2H PRN, Ivor Costa, MD, 5 mg at 07/30/21 1850   insulin aspart (novoLOG) injection 0-5 Units, 0-5 Units, Subcutaneous, QHS, Niu, Soledad Gerlach, MD   insulin aspart (novoLOG) injection 0-9 Units, 0-9 Units, Subcutaneous, TID WC, Ivor Costa, MD   lidocaine (LIDODERM) 5 % 1 patch, 1 patch, Transdermal, Q24H, Ivor Costa, MD, 1 patch at 07/30/21 1647   methocarbamol (ROBAXIN) tablet 500 mg, 500 mg, Oral, Q8H PRN, Ivor Costa, MD   morphine (PF) 2 MG/ML injection 2 mg, 2 mg, Intravenous, Q3H PRN, Ivor Costa, MD, 2 mg at 07/31/21 0441   nitroGLYCERIN (NITROSTAT) SL tablet 0.4 mg, 0.4 mg, Sublingual, Q5 min PRN, Ivor Costa, MD   ondansetron [facility]) injection 4 mg, 4 mg, Intravenous, Q8H PRN, Ivor Costa, MD, 4 mg at 07/30/21 1754   oxyCODONE-acetaminophen (PERCOCET/ROXICET) 5-325 MG per tablet 1 tablet, 1 tablet,  Oral, Q4H PRN, Ivor Costa, MD, 1 tablet at 07/30/21 1939   pantoprazole (PROTONIX) EC tablet 40 mg, 40 mg, Oral, Daily, Ivor Costa, MD   pregabalin (LYRICA) capsule 25 mg, 25 mg, Oral, BID, Ivor Costa, MD, 25 mg at 07/30/21 2120   Social History: Social History   Tobacco Use   Smoking status: Former    Types: Cigarettes   Smokeless tobacco: Never  Substance Use Topics   Alcohol use: No   Drug use: No    Family Medical  History: Family History  Problem Relation Age of Onset   Diabetes Mother    Heart failure Mother    Lung cancer Father    Lung cancer Sister    Lung cancer Brother     Physical Examination: Vitals:   07/30/21 2030 07/31/21 0535  BP: 137/66 (!) 137/57  Pulse: 67 61  Resp: 16 16  Temp: 97.8 F (36.6 C) 98 F (36.7 C)  SpO2: 94% 93%     General: Patient is well developed, well nourished, calm, collected, and in no apparent distress.  Psychiatric: Patient is non-anxious.  Head:  Pupils equal, round, and reactive to light.  ENT:  Oral mucosa appears well hydrated.  Neck:   Supple.  Full range of motion.  Respiratory: Patient is breathing without any difficulty.  Extremities: No edema.  Vascular: Palpable pulses in dorsal pedal vessels.  Skin:   On exposed skin, there are no abnormal skin lesions.  NEUROLOGICAL:  General: In no acute distress.   Awake, alert, oriented to person, place, and time.  Pupils equal round and reactive to light.  Facial tone is symmetric.  Tongue protrusion is midline.    Strength: Side Biceps Triceps Deltoid Interossei Grip Wrist Ext. Wrist Flex.  R '5 5 5 5 5 5 5  '$ L '5 5 5 5 5 5 5   '$ Side Iliopsoas Quads Hamstring PF DF EHL  R '5 5 5 5 5 5  '$ L '5 5 5 5 5 5    '$ Bilateral upper and lower extremity sensation is intact to light touch. Reflexes are 1+ and symmetric at the biceps, triceps, brachioradialis, patella and achilles. Hoffman's is absent.  Clonus is not present.  Toes are down-going.    Gait is untested.  Imaging: MRI L spine 07/30/21 IMPRESSION: 1. Acute to subacute compression fracture of the L2 vertebral body with up to approximately 40% loss of vertebral body height and mild bony retropulsion, worsened since 07/18/2021 when the fracture was likely acute. There is mild-to-moderate spinal canal stenosis without frank nerve root compression. 2. Unchanged chronic marked compression deformity of the L1 vertebral body with minimal  bony retropulsion. 3. Multilevel degenerative changes as above without high-grade spinal canal or neural foraminal stenosis.     Electronically Signed   By: Valetta Mole M.D.  I have personally reviewed the images and agree with the above interpretation.  Labs:    Latest Ref Rng & Units 07/30/2021   11:12 AM 07/19/2021    4:44 AM 07/18/2021    2:45 PM  CBC  WBC 4.0 - 10.5 K/uL 8.4  10.0  7.8   Hemoglobin 12.0 - 15.0 g/dL 12.2  10.5  10.8   Hematocrit 36.0 - 46.0 % 40.7  34.9  36.6   Platelets 150 - 400 K/uL 376  215  252        Assessment and Plan: Kerri Mccullough is a pleasant 76 y.o. female with L2 compression fracture.  This is nonoperative.  -  LSO brace - OK to ambulate with brace - Does not need to wear brace in bed - We will arrange follow up    [place]. Izora Ribas MD, [facility] of Neuro[facility]

## 2021-07-31 NOTE — Evaluation (Signed)
Physical Therapy Evaluation Patient Details Name: Kerri Mccullough MRN: [id number] DOB: 1945-06-08 Today's Date: 07/31/2021  History of Present Illness  Pt is a 76 y/o F admitted on 07/30/21 after presenting with c/o LBP. Pt was recently hospitalized 6/4-6/6 2/2 fall & LBP. Pt continues to have severe pain that radiates to B hips, L worse than R. MRI of L spine shows "Acute to subacute compression fracture of the L2 vertebral body with up to approximately 40% loss of vertebral body height and mild bony retropulsion, worsened since 07/18/2021 when the fracture was likely acute. There is mild-to-moderate spinal canal stenosis without frank nerve root compression." Neurosurgery was consulted with recommendations for no surgical intervention but use of LSO brace for ambulation. PMH: HTN, HLD, DM, GERD, depression, CAD, s/p stent placement, squamous skin CA, pancreatic mass  Clinical Impression  Pt seen for PT evaluation with PT educating her on use of back brace & back precautions. Pt requires assistance to don LSO sitting EOB & cuing to maintain back precautions with bed mobility. Pt is able to ambulate 1 lap around nurses station with RW & supervision but poor foot clearance despite pt able to improve this briefly with verbal cuing from PT. Pt engaged in STS BLE strengthening exercises but reports fatigue after 6 repetitions. Anticipate pt can return home with supervision from daughter & HHPT f/u (reports insurance did not cover HHPT last time so if they do not this time, pt would benefit from OPPT f/u).      Recommendations for follow up therapy are one component of a multi-disciplinary discharge planning process, led by the attending physician.  Recommendations may be updated based on patient status, additional functional criteria and insurance authorization.  Follow Up Recommendations Home health PT    Assistance Recommended at Discharge Intermittent Supervision/Assistance  Patient can return home  with the following  A little help with walking and/or transfers;A little help with bathing/dressing/bathroom;Assistance with cooking/housework;Assist for transportation;Help with stairs or ramp for entrance    Equipment Recommendations None recommended by PT  Recommendations for Other Services    OT consult   Functional Status Assessment Patient has had a recent decline in their functional status and demonstrates the ability to make significant improvements in function in a reasonable and predictable amount of time.     Precautions / Restrictions Precautions Precautions: Fall;Back Required Braces or Orthoses: Spinal Brace Spinal Brace: Lumbar corset (when OOB) Restrictions Weight Bearing Restrictions: No      Mobility  Bed Mobility Overal bed mobility: Needs Assistance Bed Mobility: Rolling, Sidelying to Sit Rolling: Supervision Sidelying to sit: Supervision       General bed mobility comments: cuing for log rolling technique to maintain back precautions    Transfers Overall transfer level: Needs assistance Equipment used: Rolling walker (2 wheels) Transfers: Sit to/from Stand Sit to Stand: Modified independent (Device/Increase time)           General transfer comment: STS from EOB with RW & mod I    Ambulation/Gait Ambulation/Gait assistance: Supervision Gait Distance (Feet): 160 Feet Assistive device: Rolling walker (2 wheels)   Gait velocity: decreased     General Gait Details: decreased foot clearance BLE despite pt demonstrating adequate strength in sitting, Pt able to increase foot clearance with cuing but only maintains this momentarily  Stairs            Wheelchair Mobility    Modified Rankin (Stroke Patients Only)       Balance Overall balance assessment: Needs assistance  Sitting-balance support: No upper extremity supported, Feet supported Sitting balance-Leahy Scale: Normal     Standing balance support: During functional activity,  Bilateral upper extremity supported Standing balance-Leahy Scale: Good                               Pertinent Vitals/Pain Pain Assessment Pain Assessment: 0-10 Pain Score: 5  Pain Location: "across my back & my knees" Pain Descriptors / Indicators: Discomfort Pain Intervention(s): Monitored during session    Home Living Family/patient expects to be discharged to:: Private residence Living Arrangements: Alone Available Help at Discharge: Family;Available 24 hours/day Type of Home: House Home Access: Ramped entrance       Home Layout: One level Home Equipment: BSC/3in1;Rolling Walker (2 wheels);Cane - single point Additional Comments: Daughter Kerri Mccullough) has been & will be providing 24 hr supervision at d/c.    Prior Function               Mobility Comments: Pt was independent without AD prior to last fall, has been using RW since last admission.       Hand Dominance        Extremity/Trunk Assessment   Upper Extremity Assessment Upper Extremity Assessment: Overall WFL for tasks assessed    Lower Extremity Assessment Lower Extremity Assessment: Generalized weakness (BLE hip flexion 3/5 in sitting, knee extension 3/5 in sitting, ankle dorsiflexion 3/5 in sitting, not formally tested)    Cervical / Trunk Assessment Cervical / Trunk Assessment:  (LSO brace donned sitting EOB)  Communication   Communication: No difficulties  Cognition Arousal/Alertness: Awake/alert Behavior During Therapy: WFL for tasks assessed/performed Overall Cognitive Status: Within Functional Limits for tasks assessed                                          General Comments      Exercises Other Exercises Other Exercises: PT educated pt on back precautions & need to wear LSO when OOB; pt requires ongoing education re: donning/doffing brace & reinforcing back precautions. Other Exercises: Pt completed 6x STS without BUE support with ongoing cuing (fair return  demo) to not lean posteriorly on edge of chair with BLE with CGA<>Min assist from PT with task focusing on BLE strengthening.   Assessment/Plan    PT Assessment Patient needs continued PT services  PT Problem List Decreased strength;Decreased activity tolerance;Decreased balance;Decreased mobility;Decreased knowledge of use of DME;Pain;Decreased safety awareness;Decreased knowledge of precautions       PT Treatment Interventions DME instruction;Gait training;Functional mobility training;Therapeutic activities;Therapeutic exercise;Balance training;Patient/family education;Neuromuscular re-education    PT Goals (Current goals can be found in the Care Plan section)  Acute Rehab PT Goals Patient Stated Goal: to improve pain and go home PT Goal Formulation: With patient Time For Goal Achievement: 08/14/21 Potential to Achieve Goals: Good    Frequency 7X/week     Co-evaluation               AM-PAC PT "6 Clicks" Mobility  Outcome Measure Help needed turning from your back to your side while in a flat bed without using bedrails?: None Help needed moving from lying on your back to sitting on the side of a flat bed without using bedrails?: None Help needed moving to and from a bed to a chair (including a wheelchair)?: A Little Help needed standing up from a chair  using your arms (e.g., wheelchair or bedside chair)?: None Help needed to walk in hospital room?: A Little Help needed climbing 3-5 steps with a railing? : A Little 6 Click Score: 21    End of Session Equipment Utilized During Treatment: Back brace Activity Tolerance: Patient tolerated treatment well;Patient limited by fatigue Patient left: in chair;with chair alarm set;with call bell/phone within reach Nurse Communication: Mobility status;Precautions (use of LSO) PT Visit Diagnosis: Muscle weakness (generalized) (M62.81)    Time: 6761-9509 PT Time Calculation (min) (ACUTE ONLY): 13 min   Charges:   PT  Evaluation $PT Eval Low Complexity: [place], PT, DPT 07/31/21, 10:30 AM   Waunita Schooner 07/31/2021, 10:29 AM

## 2021-08-01 DIAGNOSIS — S32010A Wedge compression fracture of first lumbar vertebra, initial encounter for closed fracture: Secondary | ICD-10-CM | POA: Diagnosis not present

## 2021-08-01 DIAGNOSIS — R112 Nausea with vomiting, unspecified: Secondary | ICD-10-CM | POA: Diagnosis not present

## 2021-08-01 DIAGNOSIS — E119 Type 2 diabetes mellitus without complications: Secondary | ICD-10-CM | POA: Diagnosis not present

## 2021-08-01 DIAGNOSIS — S32028A Other fracture of second lumbar vertebra, initial encounter for closed fracture: Secondary | ICD-10-CM | POA: Diagnosis not present

## 2021-08-01 DIAGNOSIS — R1030 Lower abdominal pain, unspecified: Secondary | ICD-10-CM | POA: Diagnosis not present

## 2021-08-01 LAB — GLUCOSE, CAPILLARY: Glucose-Capillary: 161 mg/dL — ABNORMAL HIGH (ref 70–99)

## 2021-08-01 MED ORDER — PREGABALIN 25 MG PO CAPS: 25.0000 mg | ORAL_CAPSULE | Freq: Two times a day (BID) | ORAL | 0 refills | Status: DC

## 2021-08-01 MED ORDER — LIDOCAINE 5 % EX PTCH: 1.0000 | MEDICATED_PATCH | CUTANEOUS | 0 refills | Status: DC

## 2021-08-01 MED ORDER — OXYCODONE-ACETAMINOPHEN 5-325 MG PO TABS
0.5000 | ORAL_TABLET | Freq: Four times a day (QID) | ORAL | 0 refills | Status: DC | PRN
Start: 2021-08-01 — End: 2022-01-10

## 2021-08-01 NOTE — Progress Notes (Signed)
MD order received in [facility] to discharge pt home today; TOC previously documented that the pt refused home health services; verbally reviewed AVS with the pt and the pt's daughter, T. Mulkern; Rxs escribed to [facility] on [address]; no further questions voiced at this time; pt to get dressed and apply the TSO brace and let staff know when they are dressed and ready for discharge

## 2021-08-01 NOTE — Plan of Care (Signed)

## 2021-08-01 NOTE — Discharge Instructions (Signed)
Patient refused Home Health services

## 2021-08-01 NOTE — TOC Transition Note (Signed)
Transition of Care [facility]) - CM/SW Discharge Note   Patient Details  Name: Kerri Mccullough MRN: [id number] Date of Birth: May 07, 1945  Transition of [facility]) CM/SW Contact:  Izola Price, RN Phone Number: 08/01/2021, 10:03 AM   Clinical Narrative:  6/17: [name] with patient regarding discharge plan and patient deferred to daughter. Daughter said family lives on both sides of her and "she will be well taken care ". She stated she also has all needed DME from last admission. Patient does live alone in residence with family near/next door.  Dr. August Luz PCP. [facility] [place] (N), [place].  Simmie Davies RN CM           Patient Goals and CMS Choice        Discharge Placement                       Discharge Plan and Services                                     Social Determinants of Health (SDOH) Interventions     Readmission Risk Interventions     No data to display

## 2021-08-01 NOTE — Progress Notes (Signed)
Pt discharged via wheelchair by nursing to [facility] 

## 2021-08-01 NOTE — Discharge Summary (Signed)
Physician Discharge Summary   Patient: Kerri Mccullough MRN: [id number] DOB: 05-Sep-1945  Admit date:     07/30/2021  Discharge date: 08/01/21  Discharge Physician: Sharen Hones   PCP: Rutherford Limerick, PA   Recommendations at discharge:   Follow-up with PCP in 1 week. Follow-up with Dr. Cari Caraway in 2 weeks  Discharge Diagnoses: Principal Problem:   Compression fracture of lumbar vertebra (HCC) Active Problems:   Lower back pain   Diabetes mellitus without complication (HCC)   Hypertension   Hyperlipidemia   CAD (coronary artery disease)   Pancreatic mass   Nausea & vomiting   Lower abdominal pain  Resolved Problems:   * No resolved hospital problems. *  Hospital Course: Kerri Mccullough is a 76 y.o. female with medical history significant of hypertension, hyperlipidemia, diabetes mellitus, GERD, depression, CAD, s/p of stent placement, squamous skin cancer, pancreatic mass, who presents with lower back pain. CT scan showed L1 compression fracture, has been seen by neurosurgery, no indication for surgery.  Patient is placed on LSO brace, pain medicine.  Assessment and Plan: Intractable lower back pain with compression fracture of L1 and L2 due to osteoporosis and fall.  Osteoporosis Patient still has significant back pain, continue pain medicine.  LSO placed. Patient has been seen by PT/OT, she did well.  Pain is seem to be better, will continue as needed Percocet as well as Lidoderm patch.  Patient also started on Lyrica, discontinued Neurontin. Patient instructed to follow-up with PCP in 1 week and Dr. Izora Ribas in 2 weeks.  Constipation. Nausea vomiting. Had a bowel movement yesterday, condition has improved  Type 2 diabetes Resume home medicines  Essential hypertension. Coronary artery disease Resume home medicines.  Pancreatic mass. Follow-up with Dr. Allen Norris as outpatient.         Consultants: Neurosurgery Procedures performed: None  Disposition:  Home Diet recommendation:  Discharge Diet Orders (From admission, onward)     Start     Ordered   08/01/21 0000  Diet - low sodium heart healthy        08/01/21 0938           Cardiac diet DISCHARGE MEDICATION: Allergies as of 08/01/2021       Reactions   Ace Inhibitors Swelling   Rosiglitazone Other (See Comments)   Other Reaction: SOB, Pulmonary Edema Other Reaction: SOB, Pulmonary Edema Other Reaction: SOB, Pulmonary Edema   Aspirin Swelling   When taken with a beta blocker    Golytely [peg 3350-electrolytes] Swelling        Medication List     STOP taking these medications    gabapentin 300 MG capsule Commonly known as: NEURONTIN       TAKE these medications    alendronate 70 MG tablet Commonly known as: FOSAMAX Take 1 tablet by mouth once a week.   amLODipine 5 MG tablet Commonly known as: NORVASC Take 5 mg by mouth 2 (two) times daily.   aspirin EC 81 MG tablet Take 81 mg by mouth daily.   atorvastatin 20 MG tablet Commonly known as: LIPITOR Take 20 mg by mouth daily.   carvedilol 6.25 MG tablet Commonly known as: COREG Take 1 tablet (6.25 mg total) by mouth 2 (two) times daily with a meal.   citalopram 20 MG tablet Commonly known as: CELEXA Take 20 mg by mouth daily.   glipiZIDE 5 MG 24 hr tablet Commonly known as: GLUCOTROL XL Take 5 mg by mouth daily.   Jardiance 25 MG  Tabs tablet Generic drug: empagliflozin Take 25 mg by mouth daily.   lidocaine 5 % Commonly known as: LIDODERM Place 1 patch onto the skin daily. Remove & Discard patch within 12 hours or as directed by MD   metFORMIN 1000 MG tablet Commonly known as: GLUCOPHAGE Take 1 tablet by mouth 2 (two) times daily.   naproxen 500 MG tablet Commonly known as: Naprosyn Take 1 tablet (500 mg total) by mouth 2 (two) times daily with a meal.   nitroGLYCERIN 0.4 MG SL tablet Commonly known as: NITROSTAT Place under the tongue.   omeprazole 40 MG capsule Commonly known  as: PRILOSEC Take 40 mg by mouth daily.   oxyCODONE-acetaminophen 5-325 MG tablet Commonly known as: PERCOCET/ROXICET Take 0.5 tablets by mouth every 6 (six) hours as needed for severe pain.   pregabalin 25 MG capsule Commonly known as: LYRICA Take 1 capsule (25 mg total) by mouth 2 (two) times daily.   Victoza 18 MG/3ML Sopn Generic drug: liraglutide Inject 0.6 mg into the skin daily.        Follow-up Information     August Luz A, PA Follow up in 1 week(s).   Specialty: Physician Assistant Contact information: [place] [address] [phone]         Meade Maw, MD Follow up in 2 week(s).   Specialty: Neurosurgery Contact information: [place] [address] [phone]                Discharge Exam: Danley Danker Weights   07/30/21 1109  Weight: 78.9 kg   General exam: Appears calm and comfortable  Respiratory system: Clear to auscultation. Respiratory effort normal. Cardiovascular system: S1 & S2 heard, RRR. No JVD, murmurs, rubs, gallops or clicks. No pedal edema. Gastrointestinal system: Abdomen is nondistended, soft and nontender. No organomegaly or masses felt. Normal bowel sounds heard. Central nervous system: Alert and oriented. No focal neurological deficits. Extremities: Symmetric 5 x 5 power. Skin: No rashes, lesions or ulcers Psychiatry: Judgement and insight appear normal. Mood & affect appropriate.    Condition at discharge: good  The results of significant diagnostics from this hospitalization (including imaging, microbiology, ancillary and laboratory) are listed below for reference.   Imaging Studies: MR LUMBAR SPINE WO CONTRAST  Result Date: 07/30/2021 CLINICAL DATA:  Low back pain bilateral hip pain, fall last Sunday EXAM: MRI LUMBAR SPINE WITHOUT CONTRAST TECHNIQUE: Multiplanar, multisequence MR imaging of the lumbar spine was performed. No intravenous contrast was administered. COMPARISON:   CT abdomen/pelvis 07/18/2021 FINDINGS: Segmentation: Standard; the lowest formed disc space is designated L5-S1. Alignment:  There is no significant antero or retrolisthesis. Vertebrae: There is severe compression deformity of the L1 vertebral body with up to approximately 70-80% loss of vertebral body height, similar to the prior CT from 07/18/2021, and not significantly changed since 2020. There is mild bony retropulsion resulting in mild spinal canal stenosis without conus/cauda equina nerve root compression. There is an acute to subacute compression fracture of the L2 vertebral body with up to approximately 40% loss of vertebral body height, worsened since 07/18/2021. Bony retropulsion at this level is new since 07/18/2021 resulting in mild-to-moderate spinal canal stenosis with crowding of the subarticular zones but no frank nerve root compression. The other vertebral body heights are preserved, without other acute fracture. Background marrow signal is within normal limits. There is no suspicious marrow signal abnormality. Conus medullaris and cauda equina: Conus extends to the L1 level. Conus and cauda equina appear normal.  Paraspinal and other soft tissues: Unremarkable. Disc levels: T12-L1: As above, there is mild bony retropulsion resulting in mild spinal canal stenosis without significant neural foraminal stenosis L2-L3: As above, there is bony retropulsion resulting in mild-to-moderate spinal canal stenosis with crowding of the subarticular zones but no significant neural foraminal stenosis L2-L3: No significant spinal canal or neural foraminal stenosis L3-L4: Mild disc bulge eccentric to the left and bilateral facet arthropathy results in mild left worse than right neural foraminal stenosis without significant spinal canal stenosis. L4-L5: There is a mild disc bulge and bilateral facet arthropathy resulting in mild left worse than right neural foraminal stenosis without significant spinal canal stenosis  L5-S1: There is a mild disc bulge eccentric to the left with a central annular fissure and bilateral facet arthropathy resulting in mild-to-moderate left and mild right neural foraminal stenosis without significant spinal canal stenosis. IMPRESSION: 1. Acute to subacute compression fracture of the L2 vertebral body with up to approximately 40% loss of vertebral body height and mild bony retropulsion, worsened since 07/18/2021 when the fracture was likely acute. There is mild-to-moderate spinal canal stenosis without frank nerve root compression. 2. Unchanged chronic marked compression deformity of the L1 vertebral body with minimal bony retropulsion. 3. Multilevel degenerative changes as above without high-grade spinal canal or neural foraminal stenosis. Electronically Signed   By: Valetta Mole M.D.   On: 07/30/2021 14:06   MR ABDOMEN MRCP W WO CONTAST  Result Date: 07/19/2021 CLINICAL DATA:  Cystic pancreatic lesion identified on CT exam MRI recommended for further evaluation. EXAM: MRI ABDOMEN WITHOUT AND WITH CONTRAST (INCLUDING MRCP) TECHNIQUE: Multiplanar multisequence MR imaging of the abdomen was performed both before and after the administration of intravenous contrast. Heavily T2-weighted images of the biliary and pancreatic ducts were obtained, and three-dimensional MRCP images were rendered by post processing. CONTRAST:  43m GADAVIST GADOBUTROL 1 MMOL/ML IV SOLN COMPARISON:  None Available. FINDINGS: Lower chest:  Lung bases are clear. Hepatobiliary: No hepatic lesion. No biliary dilatation. Gallbladder normal. Common bile duct normal. Pancreas: Cluster of small cysts in the head of the pancreas. Largest cyst measures 12 mm (image 21/series 2). Several adjacent smaller cystic lesions. The combined cystic complex measures 24 mm x 16 mm in coronal dimension (image 8/series 15). The cysts are hyperintense on T2 weighted imaging and demonstrates no post-contrast enhancement. Pancreatic head cysts are  adjacent to the pancreatic duct but no clear communication with the duct. Pancreatic duct is nondilated. Fatty infiltration along the body of pancreas. Spleen: Normal spleen. Adrenals/urinary tract: Adrenal glands and kidneys are normal. Stomach/Bowel: Stomach and limited of the small bowel is unremarkable Vascular/Lymphatic: Abdominal aortic normal caliber. No retroperitoneal periportal lymphadenopathy. Musculoskeletal: No aggressive osseous lesion IMPRESSION: 1. Multilobular cystic complex in the head of the pancreas has no specific worrisome characteristics. 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 Per consensus criteria, recommend follow-up MRI in 6 months. This recommendation follows ACR consensus guidelines: Management of Incidental Pancreatic Cysts: A White Paper of the ACR Incidental Findings Committee. J Am Coll Radiol 23220;25:[id number] 2. Normal biliary tree. Electronically Signed   By: SSuzy BouchardM.D.   On: 07/19/2021 07:53   MR 3D Recon At Scanner  Result Date: 07/19/2021 CLINICAL DATA:  Cystic pancreatic lesion identified on CT exam MRI recommended for further evaluation. EXAM: MRI ABDOMEN WITHOUT AND WITH CONTRAST (INCLUDING MRCP) TECHNIQUE: Multiplanar multisequence MR imaging of the abdomen was performed both before and after the administration of intravenous contrast. Heavily T2-weighted images  of the biliary and pancreatic ducts were obtained, and three-dimensional MRCP images were rendered by post processing. CONTRAST:  42m GADAVIST GADOBUTROL 1 MMOL/ML IV SOLN COMPARISON:  None Available. FINDINGS: Lower chest:  Lung bases are clear. Hepatobiliary: No hepatic lesion. No biliary dilatation. Gallbladder normal. Common bile duct normal. Pancreas: Cluster of small cysts in the head of the pancreas. Largest cyst measures 12 mm (image 21/series 2). Several adjacent smaller cystic lesions. The combined cystic complex measures 24 mm x 16 mm in coronal  dimension (image 8/series 15). The cysts are hyperintense on T2 weighted imaging and demonstrates no post-contrast enhancement. Pancreatic head cysts are adjacent to the pancreatic duct but no clear communication with the duct. Pancreatic duct is nondilated. Fatty infiltration along the body of pancreas. Spleen: Normal spleen. Adrenals/urinary tract: Adrenal glands and kidneys are normal. Stomach/Bowel: Stomach and limited of the small bowel is unremarkable Vascular/Lymphatic: Abdominal aortic normal caliber. No retroperitoneal periportal lymphadenopathy. Musculoskeletal: No aggressive osseous lesion IMPRESSION: 1. Multilobular cystic complex in the head of the pancreas has no specific worrisome characteristics. 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 Per consensus criteria, recommend follow-up MRI in 6 months. This recommendation follows ACR consensus guidelines: Management of Incidental Pancreatic Cysts: A White Paper of the ACR Incidental Findings Committee. J Am Coll Radiol 21062;69:[id number] 2. Normal biliary tree. Electronically Signed   By: SSuzy BouchardM.D.   On: 07/19/2021 07:53   CT ABDOMEN PELVIS W CONTRAST  Result Date: 07/18/2021 CLINICAL DATA:  Nausea.  Patient reports dizziness leading to fall. EXAM: CT ABDOMEN AND PELVIS WITH CONTRAST TECHNIQUE: Multidetector CT imaging of the abdomen and pelvis was performed using the standard protocol following bolus administration of intravenous contrast. RADIATION DOSE REDUCTION: This exam was performed according to the departmental dose-optimization program which includes automated exposure control, adjustment of the mA and/or kV according to patient size and/or use of iterative reconstruction technique. CONTRAST:  1039mOMNIPAQUE IOHEXOL 300 MG/ML  SOLN COMPARISON:  Pelvis CT earlier today FINDINGS: Lower chest: Nodular areas of pleural thickening in the left lower lobe are stable from 09/10/2019 chest CT and  considered benign. Hepatobiliary: Prominent right lobe of the liver spanning 18.3 cm cranial caudal. No focal liver lesion. Punctate calcified granuloma in the right lobe. Gallbladder physiologically distended, no calcified stone. No biliary dilatation. Pancreas: Fatty atrophy of the pancreatic head. 12 mm low-density pancreatic head lesion, series 2, image 30. No ductal dilatation or peripancreatic edema. Spleen: Normal in size without focal abnormality. Adrenals/Urinary Tract: Normal adrenal glands. Mild cortical scarring in both kidneys. No hydronephrosis. Homogeneous enhancement with symmetric excretion on delayed phase imaging. No renal stone or focal lesion. Only minimally distended urinary bladder, not well assessed. Stomach/Bowel: Moderate-sized hiatal hernia. Otherwise decompressed stomach. No small bowel obstruction or inflammation. Diminutive appendix tentatively visualized, regardless no appendicitis. Moderate colonic stool burden. No colonic inflammation. Vascular/Lymphatic: Moderate aortic atherosclerosis. No aortic aneurysm. Patent portal vein. No abdominopelvic adenopathy. Reproductive: Hysterectomy.  Quiescent ovaries.  No adnexal mass. Other: No free air, free fluid, or intra-abdominal fluid collection. Wispy density in the anterior abdominal wall typical of medication injection sites. Tiny fat containing umbilical hernia. Musculoskeletal: Chronic L1 compression fracture is chronic and stable from prior chest CT. No acute osseous abnormalities. IMPRESSION: 1. No acute abnormality in the abdomen/pelvis. 2. Moderate colonic stool burden, can be seen with constipation. 3. Moderate-sized hiatal hernia. 4. A 12 mm low-density pancreatic head lesion is nonspecific. Recommend nonemergent pancreatic protocol MRI for further characterization. This  should be performed on an elective outpatient basis after resolution of acute event. Aortic Atherosclerosis (ICD10-I70.0). Electronically Signed   By: Keith Rake M.D.   On: 07/18/2021 21:38   CT PELVIS WO CONTRAST  Result Date: 07/18/2021 CLINICAL DATA:  Trauma, fall EXAM: CT PELVIS WITHOUT CONTRAST TECHNIQUE: Multidetector CT imaging of the pelvis was performed following the standard protocol without intravenous contrast. RADIATION DOSE REDUCTION: This exam was performed according to the departmental dose-optimization program which includes automated exposure control, adjustment of the mA and/or kV according to patient size and/or use of iterative reconstruction technique. COMPARISON:  Radiographs done earlier today FINDINGS: There is no free fluid in the pelvic cavity. There is no wall thickening in the visualized bowel loops. Few diverticula are seen in the sigmoid colon. Uterus is not seen. Urinary bladder is unremarkable. Arterial calcifications are seen. No recent displaced fracture or dislocation is seen in the pelvis. Degenerative changes are noted in the pubic symphysis with bony spurs. There is no effusion in the right hip joint. Degenerative changes are noted in facet joints in the lower lumbar spine. Small umbilical hernia containing fat is seen. There is mild stranding in the subcutaneous plane in the anterior abdominal wall possibly sites of previous parenteral administration of medication. IMPRESSION: No recent fracture is seen in the pelvis with attention to the right hip. There is no evidence of any free fluid in the pelvic cavity. There is no bowel wall thickening. Urinary bladder is unremarkable. Degenerative changes are noted in the pubic symphysis and lower lumbar spine. Few diverticula are seen in the colon. Atherosclerotic changes are noted. Other findings as described in the body of the report. Electronically Signed   By: Elmer Picker M.D.   On: 07/18/2021 18:16   DG Hip Unilat W or Wo Pelvis 2-3 Views Right  Result Date: 07/18/2021 CLINICAL DATA:  Acute RIGHT hip pain following fall. Initial encounter. EXAM: DG HIP (WITH OR  WITHOUT PELVIS) 2-3V RIGHT COMPARISON:  None Available. FINDINGS: There is no evidence of hip fracture or dislocation. There is no evidence of arthropathy or other focal bone abnormality. IMPRESSION: Negative. Electronically Signed   By: Margarette Canada M.D.   On: 07/18/2021 16:31   DG Lumbar Spine Complete  Result Date: 07/18/2021 CLINICAL DATA:  76 year old female with low back pain following fall. EXAM: LUMBAR SPINE - COMPLETE 4+ VIEW COMPARISON:  08/11/2019 chest CT and prior studies FINDINGS: A 75% L1 compression fracture is unchanged. No acute fracture or subluxation identified. No focal bony lesions are present. Mild degenerative disc disease at L5-S1 noted. Aortic atherosclerotic calcifications noted. IMPRESSION: 1. No evidence of acute abnormality. 2. Unchanged 75% L1 compression fracture. Electronically Signed   By: Margarette Canada M.D.   On: 07/18/2021 16:29   CT Head Wo Contrast  Result Date: 07/18/2021 CLINICAL DATA:  Dizziness.  Fall.  Hit head on wall. EXAM: CT HEAD WITHOUT CONTRAST TECHNIQUE: Contiguous axial images were obtained from the base of the skull through the vertex without intravenous contrast. RADIATION DOSE REDUCTION: This exam was performed according to the departmental dose-optimization program which includes automated exposure control, adjustment of the mA and/or kV according to patient size and/or use of iterative reconstruction technique. COMPARISON:  None Available. FINDINGS: Brain: No evidence of intracranial hemorrhage, acute infarction, hydrocephalus, extra-axial collection, or mass lesion/mass effect. Mild diffuse cerebral and cerebellar atrophy noted. Mild chronic small vessel disease. Vascular:  No hyperdense vessel or other acute findings. Skull: No evidence of fracture or other  significant bone abnormality. Sinuses/Orbits:  No acute findings. Other: None. IMPRESSION: No acute intracranial abnormality. Mild cerebral and cerebellar atrophy and chronic small vessel disease.  Electronically Signed   By: Marlaine Hind M.D.   On: 07/18/2021 16:13    Microbiology: Results for orders placed or performed during the hospital encounter of 10/14/19  KOH prep     Status: None   Collection Time: 10/14/19  9:00 AM   Specimen: Esophagus  Result Value Ref Range Status   Specimen Description ESOPHAGUS  Final   Special Requests Normal  Final   KOH Prep   Final    YEAST WITH PSEUDOHYPHAE BUDDING YEAST SEEN Performed at [facility], [address] [place], [place] [address]    Report Status 10/14/2019 FINAL  Final    Labs: CBC: Recent Labs  Lab 07/30/21 1112  WBC 8.4  HGB 12.2  HCT 40.7  MCV 81.6  PLT 876   Basic Metabolic Panel: Recent Labs  Lab 07/30/21 1112  NA 136  K 4.1  CL 103  CO2 24  GLUCOSE 166*  BUN 12  CREATININE 0.98  CALCIUM 9.1   Liver Function Tests: Recent Labs  Lab 07/30/21 1112  AST 21  ALT 17  ALKPHOS 63  BILITOT 0.8  PROT 8.6*  ALBUMIN 4.0   CBG: Recent Labs  Lab 07/31/21 0753 07/31/21 1218 07/31/21 1550 07/31/21 2132 08/01/21 0746  GLUCAP 193* 145* 221* 171* 161*    Discharge time spent: greater than 30 minutes.  Signed: Sharen Hones, MD [facility] 08/01/2021

## 2021-08-01 NOTE — Plan of Care (Signed)

## 2021-08-16 ENCOUNTER — Telehealth: Payer: Self-pay

## 2021-08-16 NOTE — Telephone Encounter (Signed)
error 

## 2021-08-16 NOTE — Telephone Encounter (Signed)
-----   Message from Peggyann Shoals sent at 08/16/2021  3:31 PM EDT ----- Regarding: med refill Contact: [place] pt's daughter is calling  [facility] '25mg'$  2 x aday Roe [place] When she was discharged from the hospital they told her to discontinue gabapentin.

## 2021-08-16 NOTE — Telephone Encounter (Signed)
-----   Message from Peggyann Shoals sent at 08/16/2021  3:31 PM EDT ----- Regarding: med refill Contact: [place] pt's daughter is calling  [facility]ric '25mg'$  2 x aday Roe [place] When she was discharged from the hospital they told her to discontinue gabapentin.

## 2021-08-18 ENCOUNTER — Other Ambulatory Visit: Payer: Self-pay | Admitting: Neurosurgery

## 2021-08-18 MED ORDER — PREGABALIN 25 MG PO CAPS: 25.0000 mg | ORAL_CAPSULE | Freq: Two times a day (BID) | ORAL | 0 refills | Status: DC

## 2021-08-18 NOTE — Progress Notes (Signed)
Pregabalin filled

## 2021-09-01 ENCOUNTER — Other Ambulatory Visit: Payer: Self-pay

## 2021-09-01 DIAGNOSIS — S32020A Wedge compression fracture of second lumbar vertebra, initial encounter for closed fracture: Secondary | ICD-10-CM

## 2021-09-02 ENCOUNTER — Encounter: Payer: Self-pay | Admitting: Neurosurgery

## 2021-09-02 ENCOUNTER — Ambulatory Visit
Admission: RE | Admit: 2021-09-02 | Discharge: 2021-09-02 | Disposition: A | Discharge status: Home / Self Care | Payer: Medicare Other | Source: Ambulatory Visit | Attending: Neurosurgery | Admitting: Neurosurgery

## 2021-09-02 ENCOUNTER — Ambulatory Visit
Admission: RE | Admit: 2021-09-02 | Discharge: 2021-09-02 | Disposition: A | Discharge status: Home / Self Care | Payer: Medicare Other | Attending: Nuclear Medicine | Admitting: Nuclear Medicine

## 2021-09-02 ENCOUNTER — Ambulatory Visit (INDEPENDENT_AMBULATORY_CARE_PROVIDER_SITE_OTHER): Payer: Medicare Other | Admitting: Neurosurgery

## 2021-09-02 VITALS — BP 128/80 | Ht 64.0 in | Wt 173.0 lb

## 2021-09-02 DIAGNOSIS — M5416 Radiculopathy, lumbar region: Secondary | ICD-10-CM

## 2021-09-02 DIAGNOSIS — S32020D Wedge compression fracture of second lumbar vertebra, subsequent encounter for fracture with routine healing: Secondary | ICD-10-CM | POA: Diagnosis not present

## 2021-09-02 DIAGNOSIS — S32020A Wedge compression fracture of second lumbar vertebra, initial encounter for closed fracture: Secondary | ICD-10-CM | POA: Insufficient documentation

## 2021-09-02 DIAGNOSIS — M48061 Spinal stenosis, lumbar region without neurogenic claudication: Secondary | ICD-10-CM | POA: Diagnosis not present

## 2021-09-02 NOTE — Progress Notes (Signed)
Follow-up note: Referring Physician:  Rutherford Limerick, [place],  [place] [address]  Primary Physician:  Rutherford Limerick, PA  Chief Complaint:  hospital follow up of L2 fracture  History of Present Illness: Kerri Mccullough is a 76 y.o. female who presents today for hospital follow up of L2 compression fracture. She was initially seen by Dr. Izora Ribas in consultation on 07/31/21 after hospital admission for severe back pain and radiating into her hips. She was treated conservatively in LSO brace and recommended to follow up outpatient. Today she reports continued back pain and some radiating pain into her right buttock and posterior lateral right thigh however this is significantly improved from her hospital admission last month.  She has been compliant with her LSO brace.  She states that she is currently taking Lyrica which was prescribed to her at discharge however she was previously on gabapentin and would like to switch back to this.  She is also taking oxycodone as needed at night.  She denies any left-sided symptoms.  Review of Systems:  A 10 point review of systems is negative, and the pertinent positives and negatives detailed in the HPI.  Past Medical History: Past Medical History:  Diagnosis Date   Actinic keratosis    Diabetes mellitus without complication (HCC)    GERD (gastroesophageal reflux disease)    Heart attack ([place])    High cholesterol    Hypertension    Squamous cell carcinoma of skin    R cheek - treated at [facility]    Past Surgical History: Past Surgical History:  Procedure Laterality Date   ABDOMINAL HYSTERECTOMY     COLONOSCOPY WITH PROPOFOL N/A 10/14/2019   Procedure: COLONOSCOPY WITH PROPOFOL;  Surgeon: Lesly Rubenstein, MD;  Location: [facility] ENDOSCOPY;  Service: Endoscopy;  Laterality: N/A;   CORONARY ANGIOPLASTY WITH STENT PLACEMENT     ESOPHAGOGASTRODUODENOSCOPY (EGD) WITH PROPOFOL N/A 10/14/2019   Procedure: ESOPHAGOGASTRODUODENOSCOPY  (EGD) WITH PROPOFOL;  Surgeon: Lesly Rubenstein, MD;  Location: [facility] ENDOSCOPY;  Service: Endoscopy;  Laterality: N/A;   LEFT HEART CATH AND CORONARY ANGIOGRAPHY N/A 05/07/2018   Procedure: LEFT HEART CATH AND CORONARY ANGIOGRAPHY with possible PCI and stent;  Surgeon: Yolonda Kida, MD;  Location: [place] [facility];  Service: Cardiovascular;  Laterality: N/A;    Allergies: Allergies as of 09/02/2021 - Review Complete 07/30/2021  Allergen Reaction Noted   Ace inhibitors Swelling 06/02/2012   Rosiglitazone Other (See Comments) 11/05/2014   Aspirin Swelling 05/05/2018   Golytely [peg 3350-electrolytes] Swelling 05/05/2018    Medications: Outpatient Encounter Medications as of 09/02/2021  Medication Sig   alendronate (FOSAMAX) 70 MG tablet Take 1 tablet by mouth once a week.   amLODipine (NORVASC) 5 MG tablet Take 5 mg by mouth 2 (two) times daily.   aspirin EC 81 MG tablet Take 81 mg by mouth daily.   atorvastatin (LIPITOR) 20 MG tablet Take 20 mg by mouth daily. (Patient not taking: Reported on 07/30/2021)   carvedilol (COREG) 6.25 MG tablet Take 1 tablet (6.25 mg total) by mouth 2 (two) times daily with a meal.   citalopram (CELEXA) 20 MG tablet Take 20 mg by mouth daily.   glipiZIDE (GLUCOTROL XL) 5 MG 24 hr tablet Take 5 mg by mouth daily.   JARDIANCE 25 MG TABS tablet Take 25 mg by mouth daily.   lidocaine (LIDODERM) 5 % Place 1 patch onto the skin daily. Remove & Discard patch within 12 hours or as directed  by MD   metFORMIN (GLUCOPHAGE) 1000 MG tablet Take 1 tablet by mouth 2 (two) times daily.   naproxen (NAPROSYN) 500 MG tablet Take 1 tablet (500 mg total) by mouth 2 (two) times daily with a meal.   nitroGLYCERIN (NITROSTAT) 0.4 MG SL tablet Place under the tongue. (Patient not taking: Reported on 07/30/2021)   omeprazole (PRILOSEC) 40 MG capsule Take 40 mg by mouth daily.   oxyCODONE-acetaminophen (PERCOCET/ROXICET) 5-325 MG tablet Take 0.5 tablets by mouth every 6 (six)  hours as needed for severe pain.   pregabalin (LYRICA) 25 MG capsule Take 1 capsule (25 mg total) by mouth 2 (two) times daily.   VICTOZA 18 MG/3ML SOPN Inject 0.6 mg into the skin daily.   No facility-administered encounter medications on file as of 09/02/2021.    Social History: Social History   Tobacco Use   Smoking status: Former    Types: Cigarettes   Smokeless tobacco: Never  Substance Use Topics   Alcohol use: No   Drug use: No    Family Medical History: Family History  Problem Relation Age of Onset   Diabetes Mother    Heart failure Mother    Lung cancer Father    Lung cancer Sister    Lung cancer Brother     Exam: Vitals:   09/02/21 1119  BP: 128/80    General: A&O x 3 without acute distress.  ROM of spine: limited due to brace and pain.  Palpation of spine: TTP.  Strength in the left lower extremity is EHL 5/5, Dorsiflexion 5/5, Plantar flexion 5/5, Hamstring 5/5, Quadricep 5/5, Iliopsoas 5/5. Strength in the right lower extremity is EHL 5/5, Dorsiflexion 5/5, Plantar flexion 5/5, Hamstring 5/5, Quadricep 5/5, Iliopsoas 5/5. Reflexes are 1+ and symmetric at the patella and achilles.   Bilateral lower extremity sensation is intact to light touch.  Toes down-going.  Ambulates with the assistance of a cane  Imaging: Lumbar xrays 09/02/21 Stable appearing L1 fracture and progression of L2 fracture which is severe.  There is no evidence of segmental kyphosis.  MRI L spine 07/30/21 IMPRESSION: 1. Acute to subacute compression fracture of the L2 vertebral body with up to approximately 40% loss of vertebral body height and mild bony retropulsion, worsened since 07/18/2021 when the fracture was likely acute. There is mild-to-moderate spinal canal stenosis without frank nerve root compression. 2. Unchanged chronic marked compression deformity of the L1 vertebral body with minimal bony retropulsion. 3. Multilevel degenerative changes as above without high-grade spinal  canal or neural foraminal stenosis.   Electronically Signed   By: Valetta Mole M.D.   On: 07/30/2021 14:06  I have personally reviewed the images and agree with the above interpretation.  Assessment and Plan: Kerri Mccullough is a pleasant 76 y.o. female presents today for hospital follow-up.  She experienced a fall which resulted in a acute L2 fracture which was treated conservatively in an LSO brace.  I encouraged her to continue wearing her LSO brace and taking medications as needed.  She would like to switch back to her gabapentin which I encouraged her to discuss with her primary care provider so she can be appropriately tapered off of Lyrica and switch to gabapentin.  We also discussed her history of osteoporosis as her last DEXA scan was in 2021 she has managed and followed by her primary care provider for this and is currently taking Fosamax.  I encouraged her to discuss this with her PCP as she may need an updated DEXA  scan and medication changes.  I will see her back in 6 to 8 weeks with lumbar flex ex prior.  I encouraged her to call the office in the interim should she have any questions, concerns, or worsening symptoms.  I spent a total of 20 minutes in both face-to-face and non-face-to-face activities for this visit on the date of this encounter including review of hospital MRI, today's x-rays, prognosis, treatment options, physical exam, plan of care, and documentation.  Cooper Render PA-C  Neurosurgery

## 2021-10-13 ENCOUNTER — Other Ambulatory Visit: Payer: Self-pay

## 2021-10-13 DIAGNOSIS — S32020D Wedge compression fracture of second lumbar vertebra, subsequent encounter for fracture with routine healing: Secondary | ICD-10-CM

## 2021-10-14 ENCOUNTER — Ambulatory Visit (INDEPENDENT_AMBULATORY_CARE_PROVIDER_SITE_OTHER): Payer: Medicare Other | Admitting: Neurosurgery

## 2021-10-14 ENCOUNTER — Encounter: Payer: Self-pay | Admitting: Neurosurgery

## 2021-10-14 ENCOUNTER — Ambulatory Visit
Admission: RE | Admit: 2021-10-14 | Discharge: 2021-10-14 | Disposition: A | Discharge status: Home / Self Care | Payer: Medicare Other | Attending: Neurosurgery | Admitting: Neurosurgery

## 2021-10-14 ENCOUNTER — Ambulatory Visit
Admission: RE | Admit: 2021-10-14 | Discharge: 2021-10-14 | Disposition: A | Discharge status: Home / Self Care | Payer: Medicare Other | Source: Ambulatory Visit | Attending: Neurosurgery | Admitting: Neurosurgery

## 2021-10-14 VITALS — BP 144/66 | HR 62 | Ht 64.0 in | Wt 177.2 lb

## 2021-10-14 DIAGNOSIS — S32020D Wedge compression fracture of second lumbar vertebra, subsequent encounter for fracture with routine healing: Secondary | ICD-10-CM | POA: Insufficient documentation

## 2021-10-14 MED ORDER — PREGABALIN 25 MG PO CAPS
ORAL_CAPSULE | ORAL | 0 refills | Status: DC
Start: 2021-10-14 — End: 2023-10-06

## 2021-10-14 NOTE — Progress Notes (Signed)
Follow-up note: Referring Physician:  Loleta Dicker, [address] [place],  [place] [address]  Primary Physician:  Rutherford Limerick, [place]  Chief Complaint:  6 week follow up of L2 fracture  History of Present Illness: Kerri Mccullough is a 76 y.o. female who presents today for 6 week fracture follow up. She continues to have some pain in her back and well as some numbness into her right hip and thigh.  Overall she feels as though she has improved since her last visit and continue concerns.  She has been wearing her LSO brace periodically.  LOV 09/02/21 Kerri FRYBERGER is a 76 y.o. female who presents today for hospital follow up of L2 compression fracture. She was initially seen by Dr. Izora Ribas in consultation on 07/31/21 after hospital admission for severe back pain and radiating into her hips. She was treated conservatively in LSO brace and recommended to follow up outpatient. Today she reports continued back pain and some radiating pain into her right buttock and posterior lateral right thigh however this is significantly improved from her hospital admission last month.  She has been compliant with her LSO brace.  She states that she is currently taking Lyrica which was prescribed to her at discharge however she was previously on gabapentin and would like to switch back to this.  She is also taking oxycodone as needed at night.  She denies any left-sided symptoms.  Review of Systems:  A 10 point review of systems is negative, and the pertinent positives and negatives detailed in the HPI.  Past Medical History: Past Medical History:  Diagnosis Date   Actinic keratosis    Diabetes mellitus without complication (HCC)    GERD (gastroesophageal reflux disease)    Heart attack ([place])    High cholesterol    Hypertension    Squamous cell carcinoma of skin    R cheek - treated at [facility]    Past Surgical History: Past Surgical History:  Procedure Laterality Date    ABDOMINAL HYSTERECTOMY     COLONOSCOPY WITH PROPOFOL N/A 10/14/2019   Procedure: COLONOSCOPY WITH PROPOFOL;  Surgeon: Lesly Rubenstein, MD;  Location: [facility] END[facility]COPY;  Service: Endoscopy;  Laterality: N/A;   CORONARY ANGIOPLASTY WITH STENT PLACEMENT     ESOPHAGOGASTRODUODENOSCOPY (EGD) WITH PROPOFOL N/A 10/14/2019   Procedure: ESOPHAGOGASTRODUODENOSCOPY (EGD) WITH PROPOFOL;  Surgeon: Lesly Rubenstein, MD;  Location: [facility] END[facility];  Service: Endoscopy;  Laterality: N/A;   LEFT HEART CATH AND CORONARY ANGIOGRAPHY N/A 05/07/2018   Procedure: LEFT HEART CATH AND CORONARY ANGIOGRAPHY with possible PCI and stent;  Surgeon: Yolonda Kida, MD;  Location: [place] [facility];  Service: Cardiovascular;  Laterality: N/A;    Allergies: Allergies as of 10/14/2021 - Review Complete 09/02/2021  Allergen Reaction Noted   Ace inhibitors Swelling 06/02/2012   Rosiglitazone Other (See Comments) 11/05/2014   Aspirin Swelling 05/05/2018   Golytely [peg 3350-electrolytes] Swelling 05/05/2018    Medications: Outpatient Encounter Medications as of 10/14/2021  Medication Sig   alendronate (FOSAMAX) 70 MG tablet Take 1 tablet by mouth once a week.   amLODipine (NORVASC) 5 MG tablet Take 5 mg by mouth 2 (two) times daily.   aspirin EC 81 MG tablet Take 81 mg by mouth daily.   atorvastatin (LIPITOR) 20 MG tablet Take 20 mg by mouth daily. (Patient not taking: Reported on 07/30/2021)   carvedilol (COREG) 6.25 MG tablet Take 1 tablet (6.25 mg total) by mouth 2 (two) times daily with  a meal.   citalopram (CELEXA) 20 MG tablet Take 20 mg by mouth daily.   glipiZIDE (GLUCOTROL XL) 5 MG 24 hr tablet Take 5 mg by mouth daily.   JARDIANCE 25 MG TABS tablet Take 25 mg by mouth daily.   lidocaine (LIDODERM) 5 % Place 1 patch onto the skin daily. Remove & Discard patch within 12 hours or as directed by MD   metFORMIN (GLUCOPHAGE) 1000 MG tablet Take 1 tablet by mouth 2 (two) times daily.   naproxen (NAPROSYN) 500  MG tablet Take 1 tablet (500 mg total) by mouth 2 (two) times daily with a meal.   nitroGLYCERIN (NITROSTAT) 0.4 MG SL tablet Place under the tongue. (Patient not taking: Reported on 07/30/2021)   omeprazole (PRILOSEC) 40 MG capsule Take 40 mg by mouth daily.   oxyCODONE-acetaminophen (PERCOCET/ROXICET) 5-325 MG tablet Take 0.5 tablets by mouth every 6 (six) hours as needed for severe pain.   pregabalin (LYRICA) 25 MG capsule Take 1 capsule (25 mg total) by mouth 2 (two) times daily.   VICTOZA 18 MG/3ML SOPN Inject 0.6 mg into the skin daily.   No facility-administered encounter medications on file as of 10/14/2021.    Social History: Social History   Tobacco Use   Smoking status: Former    Types: Cigarettes   Smokeless tobacco: Never  Substance Use Topics   Alcohol use: No   Drug use: No    Family Medical History: Family History  Problem Relation Age of Onset   Diabetes Mother    Heart failure Mother    Lung cancer Father    Lung cancer Sister    Lung cancer Brother     Exam:  Today's Vitals   10/14/21 1126  BP: (!) 144/66  Pulse: 62  Weight: 80.4 kg  Height: '5\' 4"'$  (1.626 m)  PainSc: 5   PainLoc: Back   Body mass index is 30.42 kg/m.   General: A&Ox 3 ROM of spine: untested, pt in brace.  Palpation of spine: non tender.  Strength in the left lower extremity is EHL 5/5, Dorsiflexion 5/5, Plantar flexion 5/5, Hamstring 5/5, Quadricep 5/5, Iliopsoas 5/5. Strength in the right lower extremity is EHL 5/5, Dorsiflexion 5/5, Plantar flexion 5/5, Hamstring 5/5, Quadricep 5/5, Iliopsoas 5/5. Reflexes are 2+ and symmetric at the patella and achilles.   Bilateral lower extremity sensation is intact to light touch.  Ambulates with the assistance of a cane.  Imaging: 10/14/21 lumbar flex/ex Unchanged appearance of L2 fracture. No obvious clinical kyphosis on flexion/extension views.  Lumbar xrays 09/02/21 Stable appearing L1 fracture and progression of L2 fracture which is  severe.  There is no evidence of segmental kyphosis.   MRI L spine 07/30/21 IMPRESSION: 1. Acute to subacute compression fracture of the L2 vertebral body with up to approximately 40% loss of vertebral body height and mild bony retropulsion, worsened since 07/18/2021 when the fracture was likely acute. There is mild-to-moderate spinal canal stenosis without frank nerve root compression. 2. Unchanged chronic marked compression deformity of the L1 vertebral body with minimal bony retropulsion. 3. Multilevel degenerative changes as above without high-grade spinal canal or neural foraminal stenosis.   Electronically Signed   By: Valetta Mole M.D.   On: 07/30/2021 14:06  Assessment and Plan: Ms. Lievanos is a pleasant 76 y.o. female presenting today for 6 week follow up of severe L2 compression fracture. She has continued to to improve.  We discussed potentially getting a lumbar MRI for further evaluation given her right  leg numbness however she is not interested in injections or surgical options and would like to hold off on this at this time.  She continues to take Lyrica and was instructed by her PCP to increase her nighttime dose to 2 tablets and discontinue her gabapentin.  I have given her a refill for this but further refills will need to come from her primary care provider as we are no longer following her for medication response.  I encouraged her to wean out of her brace.  She will contact us should she have any questions or concerns.  We will otherwise see her going forward on an as-needed basis.  Breast understanding was in agreement with this plan.   I spent a total of 33 minutes in both face-to-face and non-face-to-face activities for this visit on the date of this encounter including review of imaging, review of symptoms, physical exam, documentation, and discussion of further treatment.  Cooper Render PA-C Neurosurgery

## 2021-12-13 ENCOUNTER — Ambulatory Visit (INDEPENDENT_AMBULATORY_CARE_PROVIDER_SITE_OTHER): Payer: Medicare Other | Admitting: Gastroenterology

## 2021-12-13 ENCOUNTER — Encounter: Payer: Self-pay | Admitting: Gastroenterology

## 2021-12-13 VITALS — BP 155/74 | HR 73 | Temp 97.7°F | Ht 64.0 in | Wt 180.0 lb

## 2021-12-13 DIAGNOSIS — K8689 Other specified diseases of pancreas: Secondary | ICD-10-CM | POA: Diagnosis not present

## 2021-12-13 NOTE — Progress Notes (Signed)
Gastroenterology Consultation  Referring Provider:     Rutherford Limerick, PA Primary Care Physician:  Rutherford Limerick, PA Primary Gastroenterologist:  Dr. Allen Norris     Reason for Consultation:     Pancreatic lesion        HPI:   Kerri Mccullough is a 76 y.o. y/o female referred for consultation & management of pancreatic lesion by Dr. Mare Loan, Janace Hoard, PA.   This patient comes in today after being seen in the past by Dr. Haig Prophet.  The patient had an EGD and colonoscopy in 2021 in the month of August for abdominal pain dysphagia and iron deficiency anemia.  The patient's upper endoscopy showed signs of reflux esophagitis and candidal esophagitis.  Patient was recently in the emergency department [facility] with chest pain preceded by 2 weeks of a cough and lower extremity swelling.  The patient comes in because she had a CT scan of the abdomen that showed a pancreatic lesion which was then again seen on follow-up of the MRI.  The patient denies any excessive alcohol use fevers chills nausea or vomiting.  Past Medical History:  Diagnosis Date   Actinic keratosis    Diabetes mellitus without complication (HCC)    GERD (gastroesophageal reflux disease)    Heart attack ([place])    High cholesterol    Hypertension    Squamous cell carcinoma of skin    R cheek - treated at [facility]    Past Surgical History:  Procedure Laterality Date   ABDOMINAL HYSTERECTOMY     COLONOSCOPY WITH PROPOFOL N/A 10/14/2019   Procedure: COLONOSCOPY WITH PROPOFOL;  Surgeon: Lesly Rubenstein, MD;  Location: [facility];  Service: Endoscopy;  Laterality: N/A;   CORONARY ANGIOPLASTY WITH STENT PLACEMENT     ESOPHAGOGASTRODUODENOSCOPY (EGD) WITH PROPOFOL N/A 10/14/2019   Procedure: ESOPHAGOGASTRODUODENOSCOPY (EGD) WITH PROPOFOL;  Surgeon: Lesly Rubenstein, MD;  Location: [facility];  Service: Endoscopy;  Laterality: N/A;   LEFT HEART CATH AND CORONARY ANGIOGRAPHY N/A 05/07/2018   Procedure: LEFT HEART CATH AND  CORONARY ANGIOGRAPHY with possible PCI and stent;  Surgeon: Yolonda Kida, MD;  Location: [place] [facility];  Service: Cardiovascular;  Laterality: N/A;    Prior to Admission medications   Medication Sig Start Date End Date Taking? Authorizing Provider  alendronate (FOSAMAX) 70 MG tablet Take 1 tablet by mouth once a week. 01/28/19   [provider]  amLODipine (NORVASC) 5 MG tablet Take 5 mg by mouth 2 (two) times daily. 04/05/18   [provider]  aspirin EC 81 MG tablet Take 81 mg by mouth daily. 11/09/11   [provider]  atorvastatin (LIPITOR) 20 MG tablet Take 20 mg by mouth daily. Patient not taking: Reported on 07/30/2021 06/28/21   [provider]  carvedilol (COREG) 6.25 MG tablet Take 1 tablet (6.25 mg total) by mouth 2 (two) times daily with a meal. 07/20/21   Swayze, Ava, DO  citalopram (CELEXA) 20 MG tablet Take 20 mg by mouth daily. 04/05/18   [provider]  glipiZIDE (GLUCOTROL XL) 5 MG 24 hr tablet Take 5 mg by mouth daily. 06/19/21   [provider]  JARDIANCE 25 MG TABS tablet Take 25 mg by mouth daily. 07/02/21   [provider]  lidocaine (LIDODERM) 5 % Place 1 patch onto the skin daily. Remove & Discard patch within 12 hours or as directed by MD 08/01/21   Sharen Hones, MD  metFORMIN (GLUCOPHAGE) 1000 MG tablet Take 1  tablet by mouth 2 (two) times daily. 08/24/19   [provider]  naproxen (NAPROSYN) 500 MG tablet Take 1 tablet (500 mg total) by mouth 2 (two) times daily with a meal. 07/20/21 07/20/22  Swayze, Ava, DO  nitroGLYCERIN (NITROSTAT) 0.4 MG SL tablet Place under the tongue. Patient not taking: Reported on 07/30/2021 05/07/18   [provider]  omeprazole (PRILOSEC) 40 MG capsule Take 40 mg by mouth daily. 04/05/18   [provider]  oxyCODONE-acetaminophen (PERCOCET/ROXICET) 5-325 MG tablet Take 0.5 tablets by mouth every 6 (six) hours as needed for severe pain. 08/01/21   Sharen Hones, MD  pregabalin (LYRICA) 25 MG capsule Take 1 capsule (25 mg total) by mouth in the morning AND 2 capsules (50 mg total) at bedtime. 10/14/21   Loleta Dicker, PA  VICTOZA 18 MG/3ML SOPN Inject 1.8 mg into the skin daily. 04/19/18   [provider]    Family History  Problem Relation Age of Onset   Diabetes Mother    Heart failure Mother    Lung cancer Father    Lung cancer Sister    Lung cancer Brother      Social History   Tobacco Use   Smoking status: Former    Types: Cigarettes   Smokeless tobacco: Never  Substance Use Topics   Alcohol use: No   Drug use: No    Allergies as of 12/13/2021 - Review Complete 10/14/2021  Allergen Reaction Noted   Ace inhibitors Swelling 06/02/2012   Rosiglitazone Other (See Comments) 11/05/2014   Aspirin Swelling 05/05/2018   Golytely [peg 3350-electrolytes] Swelling 05/05/2018    Review of Systems:    All systems reviewed and negative except where noted in HPI.   Physical Exam:  There were no vitals taken for this visit. No LMP recorded. Patient has had a hysterectomy. General:   Alert,  Well-developed, well-nourished, pleasant and cooperative in NAD Head:  Normocephalic and atraumatic. Eyes:  Sclera clear, no icterus.   Conjunctiva pink. Ears:  Normal auditory acuity. Neck:  Supple; no masses or thyromegaly. Lungs:  Respirations even and unlabored.  Clear throughout to auscultation.   No wheezes, crackles, or rhonchi. No acute distress. Heart:  Regular rate and rhythm; no murmurs, clicks, rubs, or gallops. Abdomen:  Normal bowel sounds.  No bruits.  Soft, non-tender and non-distended without masses, hepatosplenomegaly or hernias noted.  No guarding or rebound tenderness.  Negative Carnett sign.   Rectal:  Deferred.  Pulses:  Normal pulses noted. Extremities:  No clubbing or edema.  No cyanosis. Neurologic:  Alert and oriented x3;  grossly normal neurologically. Skin:  Intact without significant lesions or rashes.   No jaundice. Lymph Nodes:  No significant cervical adenopathy. Psych:  Alert and cooperative. Normal mood and affect.  Imaging Studies: No results found.  Assessment and Plan:   LOUELLEN HALDEMAN is a 76 y.o. y/o female who comes in today with a history of being seen in the past by Dr. Haig Prophet for iron deficiency anemia.  The patient was found to have a pancreatic lesion on CT with follow-up MRI suggesting a repeat MRI in 6 months.  That would be this December.  The patient will have her blood sent off for CA 19-9 and will be set up for the repeat MRI of the pancreas in December.  The patient has been explained the plan agrees with it.    Lucilla Lame, MD. Marval Regal    Note: This dictation was prepared with Dragon dictation  along with smaller phrase technology. Any transcriptional errors that result from this process are unintentional.

## 2021-12-14 LAB — CANCER ANTIGEN 19-9: CA 19-9: 9 U/mL (ref 0–35)

## 2022-01-03 ENCOUNTER — Ambulatory Visit (INDEPENDENT_AMBULATORY_CARE_PROVIDER_SITE_OTHER): Payer: Medicare Other | Admitting: Urology

## 2022-01-03 VITALS — BP 125/69 | HR 73 | Ht 64.0 in | Wt 184.0 lb

## 2022-01-03 DIAGNOSIS — R32 Unspecified urinary incontinence: Secondary | ICD-10-CM

## 2022-01-03 LAB — MICROSCOPIC EXAMINATION

## 2022-01-03 LAB — URINALYSIS, COMPLETE
Bilirubin, UA: NEGATIVE
Glucose, UA: NEGATIVE
Leukocytes,UA: NEGATIVE
Nitrite, UA: NEGATIVE
RBC, UA: NEGATIVE
Specific Gravity, UA: 1.03 (ref 1.005–1.030)
Urobilinogen, Ur: 0.2 mg/dL (ref 0.2–1.0)
pH, UA: 5.5 (ref 5.0–7.5)

## 2022-01-03 NOTE — Addendum Note (Signed)
Addended by: Despina Hidden on: 01/03/2022 03:41 PM   Modules accepted: Orders

## 2022-01-03 NOTE — Progress Notes (Signed)
01/03/2022 2:52 PM   Wendee Copp Mar 11, 1945 [id number]  Referring provider: Howard Pouch, NP [address] [place],  [place] [address]  Chief Complaint  Patient presents with   Urinary Incontinence    HPI: I was consulted to assist the patient's urinary incontinence worse since a fall and fractured back in June.  She primarily to awareness.  She leaks with coughing sneezing and may be bending lifting.  She really does not have urge incontinence.  She has had she does not really void during the day because it is all in the pad.  She is wearing 4-5 diapers a day moderately wet to soaked.  She wears 3 pads at night due to high-volume enuresis.  She gets up a couple times at night to urinate  She was nonspecific but has milder incontinence prior to the injury wearing 2-3 pads a day.  On further questioning she had stress incontinence but is definitely worse.  She also thinks she has urgency incontinence a year ago but not now.2 she said the leakage well awareness started after the fall  She is on oral hypoglycemics.  She has had a hysterectomy.  No vaginal or perineal numbness.  She may have left-sided sciatica.  She has significant ankle edema bilaterally.  He did have an MRI in June 2023 with an acute or subacute compression fracture of L2 vertebrae and some chronic changes.  It appears she has been managed with conservative therapy but it was nonspecific by history and medical record  PMH: Past Medical History:  Diagnosis Date   Actinic keratosis    Diabetes mellitus without complication (HCC)    GERD (gastroesophageal reflux disease)    Heart attack ([place])    High cholesterol    Hypertension    Squamous cell carcinoma of skin    R cheek - treated at [facility]    Surgical History: Past Surgical History:  Procedure Laterality Date   ABDOMINAL HYSTERECTOMY     COLONOSCOPY WITH PROPOFOL N/A 10/14/2019   Procedure: COLONOSCOPY WITH PROPOFOL;  Surgeon: Lesly Rubenstein, MD;   Location: [facility] ENDOSCOPY;  Service: Endoscopy;  Laterality: N/A;   CORONARY ANGIOPLASTY WITH STENT PLACEMENT     ESOPHAGOGASTRODUODENOSCOPY (EGD) WITH PROPOFOL N/A 10/14/2019   Procedure: ESOPHAGOGASTRODUODENOSCOPY (EGD) WITH PROPOFOL;  Surgeon: Lesly Rubenstein, MD;  Location: [facility] ENDOSCOPY;  Service: Endoscopy;  Laterality: N/A;   LEFT HEART CATH AND CORONARY ANGIOGRAPHY N/A 05/07/2018   Procedure: LEFT HEART CATH AND CORONARY ANGIOGRAPHY with possible PCI and stent;  Surgeon: Yolonda Kida, MD;  Location: [facility];  Service: Cardiovascular;  Laterality: N/A;    Home Medications:  Allergies as of 01/03/2022       Reactions   Ace Inhibitors Swelling   Rosiglitazone Other (See Comments)   Other Reaction: SOB, Pulmonary Edema Other Reaction: SOB, Pulmonary Edema Other Reaction: SOB, Pulmonary Edema   Aspirin Swelling   When taken with a beta blocker    Golytely [peg 3350-electrolytes] Swelling        Medication List        Accurate as of January 03, 2022  2:52 PM. If you have any questions, ask your nurse or doctor.          alendronate 70 MG tablet Commonly known as: FOSAMAX Take 1 tablet by mouth once a week.   amLODipine 5 MG tablet Commonly known as: NORVASC Take 5 mg by mouth 2 (two) times daily.   aspirin EC 81 MG tablet Take  81 mg by mouth daily.   atorvastatin 20 MG tablet Commonly known as: LIPITOR Take 20 mg by mouth daily.   carvedilol 6.25 MG tablet Commonly known as: COREG Take 1 tablet (6.25 mg total) by mouth 2 (two) times daily with a meal.   citalopram 20 MG tablet Commonly known as: CELEXA Take 20 mg by mouth daily.   glipiZIDE 5 MG 24 hr tablet Commonly known as: GLUCOTROL XL Take 5 mg by mouth daily.   Jardiance 25 MG Tabs tablet Generic drug: empagliflozin Take 25 mg by mouth daily.   lidocaine 5 % Commonly known as: LIDODERM Place 1 patch onto the skin daily. Remove & Discard patch within 12 hours or as  directed by MD   metFORMIN 1000 MG tablet Commonly known as: GLUCOPHAGE Take 1 tablet by mouth 2 (two) times daily.   naproxen 500 MG tablet Commonly known as: Naprosyn Take 1 tablet (500 mg total) by mouth 2 (two) times daily with a meal.   nitroGLYCERIN 0.4 MG SL tablet Commonly known as: NITROSTAT Place under the tongue.   omeprazole 40 MG capsule Commonly known as: PRILOSEC Take 40 mg by mouth daily.   oxyCODONE-acetaminophen 5-325 MG tablet Commonly known as: PERCOCET/ROXICET Take 0.5 tablets by mouth every 6 (six) hours as needed for severe pain.   pregabalin 25 MG capsule Commonly known as: LYRICA Take 1 capsule (25 mg total) by mouth in the morning AND 2 capsules (50 mg total) at bedtime.   Victoza 18 MG/3ML Sopn Generic drug: liraglutide Inject 1.8 mg into the skin daily.        Allergies:  Allergies  Allergen Reactions   Ace Inhibitors Swelling   Rosiglitazone Other (See Comments)    Other Reaction: SOB, Pulmonary Edema Other Reaction: SOB, Pulmonary Edema Other Reaction: SOB, Pulmonary Edema    Aspirin Swelling    When taken with a beta blocker    Golytely [Peg 3350-Electrolytes] Swelling    Family History: Family History  Problem Relation Age of Onset   Diabetes Mother    Heart failure Mother    Lung cancer Father    Lung cancer Sister    Lung cancer Brother     Social History:  reports that she has quit smoking. Her smoking use included cigarettes. She has never used smokeless tobacco. She reports that she does not drink alcohol and does not use drugs.  ROS:                                        Physical Exam: There were no vitals taken for this visit.  Constitutional:  Alert and oriented, No acute distress. HEENT: [place] AT, moist mucus membranes.  Trachea midline, no masses. Cardiovascular: No clubbing, cyanosis, or edema. Respiratory: Normal respiratory effort, no increased work of breathing. GI: Abdomen is  soft, nontender, nondistended, no abdominal masses GU: Mild grade 2 hypermobility the bladder neck and a mild positive cough test Skin: No rashes, bruises or suspicious lesions. Lymph: No cervical or inguinal adenopathy. Neurologic: Grossly intact, no focal deficits, moving all 4 extremities. Psychiatric: Normal mood and affect.  Laboratory Data: Lab Results  Component Value Date   WBC 8.4 07/30/2021   HGB 12.2 07/30/2021   HCT 40.7 07/30/2021   MCV 81.6 07/30/2021   PLT 376 07/30/2021    Lab Results  Component Value Date   CREATININE 0.98 07/30/2021  No results found for: "PSA"  No results found for: "TESTOSTERONE"  Lab Results  Component Value Date   HGBA1C 8.5 (H) 07/18/2021    Urinalysis    Component Value Date/Time   COLORURINE YELLOW (A) 07/30/2021 1112   APPEARANCEUR CLEAR (A) 07/30/2021 1112   LABSPEC 1.027 07/30/2021 1112   PHURINE 5.0 07/30/2021 1112   GLUCOSEU >=500 (A) 07/30/2021 1112   HGBUR NEGATIVE 07/30/2021 1112   BILIRUBINUR NEGATIVE 07/30/2021 1112   KETONESUR NEGATIVE 07/30/2021 1112   PROTEINUR 30 (A) 07/30/2021 1112   NITRITE NEGATIVE 07/30/2021 1112   LEUKOCYTESUR NEGATIVE 07/30/2021 1112    Pertinent Imaging: Urine reviewed and sent for culture  Assessment & Plan: Patient has mixed incontinence and leakage well awareness.  She does not void during the day in many cases due to significant incontinence.  History suggestive of neurogenic bladder.  Role of urodynamics and cystoscopy discussed.  She also has bedwetting and stress incontinence.  Call if culture positive   . Urinary incontinence, unspecified type  - Urinalysis, Complete   No follow-ups on file.  Reece Packer, MD  [facility] [address] [place], [place] [address] [phone]

## 2022-01-05 LAB — CULTURE, URINE COMPREHENSIVE

## 2022-01-10 ENCOUNTER — Other Ambulatory Visit: Payer: Self-pay | Admitting: *Deleted

## 2022-01-10 MED ORDER — SULFAMETHOXAZOLE-TRIMETHOPRIM 800-160 MG PO TABS: 1.0000 | ORAL_TABLET | Freq: Two times a day (BID) | ORAL | 0 refills | Status: AC

## 2022-01-21 ENCOUNTER — Ambulatory Visit
Admission: RE | Admit: 2022-01-21 | Discharge: 2022-01-21 | Disposition: A | Discharge status: Home / Self Care | Payer: Medicare Other | Source: Ambulatory Visit | Attending: Gastroenterology | Admitting: Gastroenterology

## 2022-01-21 DIAGNOSIS — K8689 Other specified diseases of pancreas: Secondary | ICD-10-CM | POA: Insufficient documentation

## 2022-01-21 MED ORDER — GADOBUTROL 1 MMOL/ML IV SOLN
7.5000 mL | Freq: Once | INTRAVENOUS | Status: AC | PRN
  Administered 2022-01-21: 7.5 mL via INTRAVENOUS

## 2022-01-30 ENCOUNTER — Other Ambulatory Visit: Payer: Self-pay | Admitting: Neurosurgery

## 2022-02-15 ENCOUNTER — Telehealth: Payer: Medicare Other | Admitting: Gastroenterology

## 2022-02-18 ENCOUNTER — Other Ambulatory Visit: Payer: Self-pay | Admitting: Nurse Practitioner

## 2022-02-18 DIAGNOSIS — R1310 Dysphagia, unspecified: Secondary | ICD-10-CM

## 2022-02-18 DIAGNOSIS — K219 Gastro-esophageal reflux disease without esophagitis: Secondary | ICD-10-CM

## 2022-02-28 ENCOUNTER — Ambulatory Visit (INDEPENDENT_AMBULATORY_CARE_PROVIDER_SITE_OTHER): Payer: 59 | Admitting: Urology

## 2022-02-28 ENCOUNTER — Encounter: Payer: Self-pay | Admitting: Urology

## 2022-02-28 VITALS — BP 129/72 | HR 80 | Ht 64.0 in | Wt 177.0 lb

## 2022-02-28 DIAGNOSIS — R32 Unspecified urinary incontinence: Secondary | ICD-10-CM

## 2022-02-28 LAB — MICROSCOPIC EXAMINATION: Epithelial Cells (non renal): >10 /hpf — AB (ref 0–10)

## 2022-02-28 LAB — URINALYSIS, COMPLETE
Bilirubin, UA: NEGATIVE
Glucose, UA: NEGATIVE
Leukocytes,UA: NEGATIVE
Nitrite, UA: NEGATIVE
RBC, UA: NEGATIVE
Specific Gravity, UA: 1.025 (ref 1.005–1.030)
Urobilinogen, Ur: 1 mg/dL (ref 0.2–1.0)
pH, UA: 6 (ref 5.0–7.5)

## 2022-02-28 MED ORDER — MIRABEGRON ER 50 MG PO TB24: 50.0000 mg | ORAL_TABLET | Freq: Every day | ORAL | 11 refills | Status: DC

## 2022-02-28 MED ORDER — MIRABEGRON ER 50 MG PO TB24: 50.0000 mg | ORAL_TABLET | Freq: Every day | ORAL | 0 refills | Status: DC

## 2022-02-28 NOTE — Progress Notes (Signed)
02/28/2022 2:24 PM   Kerri Mccullough September 08, 1945 [id number]  Referring provider: [address], [place],  [place] [address]  Chief Complaint  Patient presents with   Cysto    HPI: I was consulted to assist the patient's urinary incontinence worse since a fall and fractured back in June.  She primarily leaks without awareness.  She leaks with coughing sneezing and may be bending lifting.  She really does not have urge incontinence.  She has had she does not really void during the day because it is all in the pad.  She is wearing 4-5 diapers a day moderately wet to soaked.  She wears 3 pads at night due to high-volume enuresis.  She gets up a couple times at night to urinate   She was nonspecific but has milder incontinence prior to the injury wearing 2-3 pads a day.  On further questioning she had stress incontinence but is definitely worse.  She also thinks she has urgency incontinence a year ago but not now.2 she said the leakage well awareness started after the fall   She is on oral hypoglycemics.  She has had a hysterectomy.   No vaginal or perineal numbness.  She may have left-sided sciatica.  She has significant ankle edema bilaterally.   He did have an MRI in June 2023 with an acute or subacute compression fracture of L2 vertebrae and some chronic changes.  It appears she has been managed with conservative therapy but it was nonspecific by history and medical record    Mild grade 2 hypermobility the bladder neck and a mild positive cough test   Patient has mixed incontinence and leakage well awareness. She does not void during the day in many cases due to significant incontinence. History suggestive of neurogenic bladder. Role of urodynamics and cystoscopy discussed. She also has bedwetting and stress incontinence. Call if culture positive   TOday Frequency stable.  Incontinence stable.  Culture was positive On urodynamics patient did not void and was  catheterized for few milliliters.  Maximum bladder capacity was 212 mL.  She had increased bladder sensation.  She had an unstable bladder reaching pressure 30 cm of water.  It was associate with urge incontinence.  Her cough leak point pressure 200 mL with 50 cm of water and there was mild leakage.  At 230 mL her Valsalva leak point pressure was 40 cm of water with mild leakage.  During voiding she tried to void by abdominal straining.  It was felt that she generated low pressure contraction of 14 cmH2O but I am highly suspect that this was subtraction artifact.  I do not think she actually did generate a detrusor contraction.  She voided 195 mL with maximal of 8 mL/s.  Residual is 35 mL.  She had interrupted prolonged flow.  EMG activity increased.  Bladder neck descent at 1 to 2 cm.  The details of the urodynamics are signed dictated  On pelvic examination she had mild grade 2 hypermobility the bladder neck and she did have a more impressive positive cough test before and after cystoscopy.  No significant prolapse  Cystoscopy: Patient underwent flexible cystoscopy.  Bladder mucosa and trigone were normal.  No cystitis.  No carcinoma.      PMH: Past Medical History:  Diagnosis Date   Actinic keratosis    Diabetes mellitus without complication (HCC)    GERD (gastroesophageal reflux disease)    Heart attack ([place])    High cholesterol  Hypertension    Squamous cell carcinoma of skin    R cheek - [facility] at [facility]    Surgical History: Past Surgical History:  Procedure Laterality Date   ABDOMINAL HYSTERECTOMY     COLONOSCOPY WITH PROPOFOL N/A 10/14/2019   Procedure: COLONOSCOPY WITH PROPOFOL;  Surgeon: Lesly Rubenstein, MD;  Location: [facility];  Service: Endoscopy;  Laterality: N/A;   CORONARY ANGIOPLASTY WITH STENT PLACEMENT     ESOPHAGOGASTRODUODENOSCOPY (EGD) WITH PROPOFOL N/A 10/14/2019   Procedure: ESOPHAGOGASTRODUODENOSCOPY (EGD) WITH PROPOFOL;  Surgeon: Lesly Rubenstein,  MD;  Location: [facility] END[facility];  Service: Endoscopy;  Laterality: N/A;   LEFT HEART CATH AND CORONARY ANGIOGRAPHY N/A 05/07/2018   Procedure: LEFT HEART CATH AND CORONARY ANGIOGRAPHY with possible PCI and stent;  Surgeon: Yolonda Kida, MD;  Location: [facility];  Service: Cardiovascular;  Laterality: N/A;    Home Medications:  Allergies as of 02/28/2022       Reactions   Ace Inhibitors Swelling   Rosiglitazone Other (See Comments)   Other Reaction: SOB, Pulmonary Edema Other Reaction: SOB, Pulmonary Edema Other Reaction: SOB, Pulmonary Edema   Aspirin Swelling   When taken with a beta blocker    Golytely [peg 3350-kcl-nabcb-nacl-nasulf] Swelling        Medication List        Accurate as of February 28, 2022  2:24 PM. If you have any questions, ask your nurse or doctor.          STOP taking these medications    lidocaine 5 % Commonly known as: LIDODERM       TAKE these medications    alendronate 70 MG tablet Commonly known as: FOSAMAX Take 1 tablet by mouth once a week.   amLODipine 5 MG tablet Commonly known as: NORVASC Take 5 mg by mouth 2 (two) times daily.   aspirin EC 81 MG tablet Take 81 mg by mouth daily.   atorvastatin 20 MG tablet Commonly known as: LIPITOR Take 20 mg by mouth daily.   carvedilol 6.25 MG tablet Commonly known as: COREG Take 1 tablet (6.25 mg total) by mouth 2 (two) times daily with a meal.   citalopram 20 MG tablet Commonly known as: CELEXA Take 20 mg by mouth daily.   glipiZIDE 5 MG 24 hr tablet Commonly known as: GLUCOTROL XL Take 5 mg by mouth daily.   Jardiance 25 MG Tabs tablet Generic drug: empagliflozin Take 25 mg by mouth daily.   metFORMIN 1000 MG tablet Commonly known as: GLUCOPHAGE Take 1 tablet by mouth 2 (two) times daily.   naproxen 500 MG tablet Commonly known as: Naprosyn Take 1 tablet (500 mg total) by mouth 2 (two) times daily with a meal.   nitroGLYCERIN 0.4 MG SL tablet Commonly  known as: NITROSTAT Place under the tongue.   omeprazole 40 MG capsule Commonly known as: PRILOSEC Take 40 mg by mouth daily.   pregabalin 25 MG capsule Commonly known as: LYRICA Take 1 capsule (25 mg total) by mouth in the morning AND 2 capsules (50 mg total) at bedtime.   Victoza 18 MG/3ML Sopn Generic drug: liraglutide Inject 1.8 mg into the skin daily.        Allergies:  Allergies  Allergen Reactions   Ace Inhibitors Swelling   Rosiglitazone Other (See Comments)    Other Reaction: SOB, Pulmonary Edema Other Reaction: SOB, Pulmonary Edema Other Reaction: SOB, Pulmonary Edema    Aspirin Swelling    When taken with a beta blocker  Golytely [Peg 3350-Kcl-Nabcb-Nacl-Nasulf] Swelling    Family History: Family History  Problem Relation Age of Onset   Diabetes Mother    Heart failure Mother    Lung cancer Father    Lung cancer Sister    Lung cancer Brother     Social History:  reports that she has quit smoking. Her smoking use included cigarettes. She has never used smokeless tobacco. She reports that she does not drink alcohol and does not use drugs.  ROS:                                        Physical Exam: There were no vitals taken for this visit.  Constitutional:  Alert and oriented, No acute distress. HEENT: [place] AT, moist mucus membranes.  Trachea midline, no masses.   Laboratory Data: Lab Results  Component Value Date   WBC 8.4 07/30/2021   HGB 12.2 07/30/2021   HCT 40.7 07/30/2021   MCV 81.6 07/30/2021   PLT 376 07/30/2021    Lab Results  Component Value Date   CREATININE 0.98 07/30/2021    No results found for: "PSA"  No results found for: "TESTOSTERONE"  Lab Results  Component Value Date   HGBA1C 8.5 (H) 07/18/2021    Urinalysis    Component Value Date/Time   COLORURINE YELLOW (A) 07/30/2021 1112   APPEARANCEUR Clear 01/03/2022 1452   LABSPEC 1.027 07/30/2021 1112   PHURINE 5.0 07/30/2021 1112    GLUCOSEU Negative 01/03/2022 1452   HGBUR NEGATIVE 07/30/2021 1112   BILIRUBINUR Negative 01/03/2022 1452   KETONESUR NEGATIVE 07/30/2021 1112   PROTEINUR 1+ (A) 01/03/2022 1452   PROTEINUR 30 (A) 07/30/2021 1112   NITRITE Negative 01/03/2022 1452   NITRITE NEGATIVE 07/30/2021 1112   LEUKOCYTESUR Negative 01/03/2022 1452   LEUKOCYTESUR NEGATIVE 07/30/2021 1112    Pertinent Imaging:   Assessment & Plan: Patient has mixed incontinence and likely a neurogenic bladder.  She does have a small capacity bladder with significant detrusor overactivity.  Theoretically she could have done elevated her bladder neck worsening her stress incontinence.  She should not have a sling since she be a very high risk of retention based upon risk factors and urodynamic findings as well as persistent or worsening overactivity.  I urethral bulking agent is an option and may need to be used in the future to help reach her treatment goal.  I will see her back on timed voiding fluid modifications on Myrbetriq 50 mg samples and prescription.  Her leakage well awareness could be due to overactivity or urethral insufficiency.  Her high-volume bedwetting would be  due to overactivity.  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  She understands there is not an obvious clear path at this stage.  Based on the mechanism of action detrusor overactivity is usually the culprit  1. Urinary incontinence, unspecified type  - Urinalysis, Complete   No follow-ups on file.  Reece Packer, MD  [facility] [address], [place], [place] [address] [phone]

## 2022-03-01 ENCOUNTER — Ambulatory Visit
Admission: RE | Admit: 2022-03-01 | Discharge: 2022-03-01 | Disposition: A | Discharge status: Home / Self Care | Payer: 59 | Source: Ambulatory Visit | Attending: Nurse Practitioner | Admitting: Nurse Practitioner

## 2022-03-01 ENCOUNTER — Other Ambulatory Visit: Payer: Self-pay | Admitting: Nurse Practitioner

## 2022-03-01 DIAGNOSIS — K219 Gastro-esophageal reflux disease without esophagitis: Secondary | ICD-10-CM | POA: Diagnosis present

## 2022-03-01 DIAGNOSIS — R1310 Dysphagia, unspecified: Secondary | ICD-10-CM

## 2022-03-01 DIAGNOSIS — K449 Diaphragmatic hernia without obstruction or gangrene: Secondary | ICD-10-CM | POA: Diagnosis present

## 2022-03-01 DIAGNOSIS — R933 Abnormal findings on diagnostic imaging of other parts of digestive tract: Secondary | ICD-10-CM

## 2022-03-02 ENCOUNTER — Other Ambulatory Visit: Payer: Self-pay | Admitting: Family Medicine

## 2022-03-02 DIAGNOSIS — Z1231 Encounter for screening mammogram for malignant neoplasm of breast: Secondary | ICD-10-CM

## 2022-03-03 ENCOUNTER — Other Ambulatory Visit: Payer: Self-pay

## 2022-03-03 MED ORDER — NITROFURANTOIN MACROCRYSTAL 100 MG PO CAPS: 100.0000 mg | ORAL_CAPSULE | Freq: Two times a day (BID) | ORAL | 0 refills | Status: DC

## 2022-03-04 ENCOUNTER — Ambulatory Visit
Admission: RE | Admit: 2022-03-04 | Discharge: 2022-03-04 | Disposition: A | Discharge status: Home / Self Care | Payer: 59 | Source: Ambulatory Visit | Attending: Nurse Practitioner | Admitting: Nurse Practitioner

## 2022-03-04 DIAGNOSIS — R933 Abnormal findings on diagnostic imaging of other parts of digestive tract: Secondary | ICD-10-CM | POA: Insufficient documentation

## 2022-03-04 DIAGNOSIS — R1310 Dysphagia, unspecified: Secondary | ICD-10-CM | POA: Insufficient documentation

## 2022-03-04 NOTE — Progress Notes (Signed)
Modified Barium Swallow Progress Note  Patient Details  Name: Kerri Mccullough MRN: [id number] Date of Birth: 04-06-1945  Today's Date: 03/04/2022  Modified Barium Swallow completed.  Full report located under Chart Review in the Imaging Section.  Brief recommendations include the following:  Clinical Impression  Pt presents with adequate oropharyngeal abilities when consuming nectar thick liquids, thin liquids and whole barium tablet with thin liquids via straw. Pt with occasional incidence of very trace penetration during consecutive swallows but again pt's pharyngeal abilities are ocnsidered to be solidly within the normal range. Education provided to pt.   Swallow Evaluation Recommendations       SLP Diet Recommendations: Regular solids;Thin liquid   Liquid Administration via: Cup;Straw   Medication Administration: Whole meds with liquid   Supervision: Patient able to self feed   Compensations: Minimize environmental distractions;Slow rate;Small sips/bites   Postural Changes: Seated upright at 90 degrees;Remain semi-upright after after feeds/meals (Comment)   Oral Care Recommendations: Oral care BID        Shevy Yaney 03/04/2022,2:01 PM

## 2022-03-05 LAB — CULTURE, URINE COMPREHENSIVE

## 2022-04-18 ENCOUNTER — Encounter: Payer: Self-pay | Admitting: Urology

## 2022-04-18 ENCOUNTER — Ambulatory Visit (INDEPENDENT_AMBULATORY_CARE_PROVIDER_SITE_OTHER): Payer: 59 | Admitting: Urology

## 2022-04-18 VITALS — BP 143/76 | HR 63 | Ht 64.0 in | Wt 174.2 lb

## 2022-04-18 DIAGNOSIS — R32 Unspecified urinary incontinence: Secondary | ICD-10-CM | POA: Diagnosis not present

## 2022-04-18 LAB — MICROSCOPIC EXAMINATION

## 2022-04-18 LAB — URINALYSIS, COMPLETE
Bilirubin, UA: NEGATIVE
Glucose, UA: NEGATIVE
Leukocytes,UA: NEGATIVE
Nitrite, UA: NEGATIVE
RBC, UA: NEGATIVE
Specific Gravity, UA: >1.03 — ABNORMAL HIGH (ref 1.005–1.030)
Urobilinogen, Ur: 1 mg/dL (ref 0.2–1.0)
pH, UA: 5.5 (ref 5.0–7.5)

## 2022-04-18 NOTE — Progress Notes (Signed)
04/18/2022 2:42 PM   Kerri Mccullough May 23, 1945 [id number]  Referring provider: Rutherford Limerick, [place],  [place] [address]  Chief Complaint  Patient presents with   Follow-up   Urinary Incontinence    6 week follow-up    HPI: I was consulted to assist the patient's urinary incontinence worse since a fall and fractured back in June.  She primarily leaks without awareness.  She leaks with coughing sneezing and may be bending lifting.  She really does not have urge incontinence.  She has had she does not really void during the day because it is all in the pad.  She is wearing 4-5 diapers a day moderately wet to soaked.  She wears 3 pads at night due to high-volume enuresis.  She gets up a couple times at night to urinate   She was nonspecific but has milder incontinence prior to the injury wearing 2-3 pads a day.  On further questioning she had stress incontinence but is definitely worse.  She also thinks she has urgency incontinence a year ago but not now.2 she said the leakage well awareness started after the fall   She is on oral hypoglycemics.  She has had a hysterectomy.   No vaginal or perineal numbness.  She may have left-sided sciatica.  She has significant ankle edema bilaterally.   He did have an MRI in June 2023 with an acute or subacute compression fracture of L2 vertebrae and some chronic changes.  It appears she has been managed with conservative therapy but it was nonspecific by history and medical record     Mild grade 2 hypermobility the bladder neck and a mild positive cough test    Patient has mixed incontinence and leakage well awareness. She does not void during the day in many cases due to significant incontinence. History suggestive of neurogenic bladder. She also has bedwetting and stress incontinence. Call if culture positive    Culture was positive On urodynamics patient did not void and was catheterized for few milliliters.  Maximum bladder  capacity was 212 mL.  She had increased bladder sensation.  She had an unstable bladder reaching pressure 30 cm of water.  It was associate with urge incontinence.  Her cough leak point pressure 200 mL with 50 cm of water and there was mild leakage.  At 230 mL her Valsalva leak point pressure was 40 cm of water with mild leakage.  During voiding she tried to void by abdominal straining.  It was felt that she generated low pressure contraction of 14 cmH2O but I am highly suspect that this was subtraction artifact.  I do not think she actually did generate a detrusor contraction.  She voided 195 mL with maximal of 8 mL/s.  Residual is 35 mL.  She had interrupted prolonged flow.  EMG activity increased.  Bladder neck descent at 1 to 2 cm.    On pelvic examination she had mild grade 2 hypermobility the bladder neck and she did have a more impressive positive cough test before and after cystoscopy.  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  She does have a small capacity bladder with significant detrusor overactivity.  Theoretically she could have denervated  her bladder neck worsening her stress incontinence.  She should not have a sling since she be a very high risk of retention based upon risk factors and urodynamic findings as well as persistent  or worsening overactivity.  A urethral bulking agent is an option and may need to be used in the future to help reach her treatment goal.  I will see her back on timed voiding fluid modifications on Myrbetriq 50 mg samples and prescription.  Her leakage well awareness could be due to overactivity or urethral insufficiency.  Her high-volume bedwetting would be  due to overactivity.  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  She understands there is not an obvious clear path at this stage.  Based on the mechanism of  action detrusor overactivity is usually the culprit   Today Last 2 urine cultures were positive.  Patient is dramatically better wearing 2 pads during the day and 1 at night.  High-volume bedwetting dramatically better.  Very pleased.  Clinically not infected but urine was suspicious for possible UTI   PMH: Past Medical History:  Diagnosis Date   Actinic keratosis    Diabetes mellitus without complication (HCC)    GERD (gastroesophageal reflux disease)    Heart attack ([place])    High cholesterol    Hypertension    Squamous cell carcinoma of skin    R cheek - treated at [facility]    Surgical History: Past Surgical History:  Procedure Laterality Date   ABDOMINAL HYSTERECTOMY     COLONOSCOPY WITH PROPOFOL N/A 10/14/2019   Procedure: COLONOSCOPY WITH PROPOFOL;  Surgeon: Lesly Rubenstein, MD;  Location: [facility] ENDOSCOPY;  Service: Endoscopy;  Laterality: N/A;   CORONARY ANGIOPLASTY WITH STENT PLACEMENT     ESOPHAGOGASTRODUODENOSCOPY (EGD) WITH PROPOFOL N/A 10/14/2019   Procedure: ESOPHAGOGASTRODUODENOSCOPY (EGD) WITH PROPOFOL;  Surgeon: Lesly Rubenstein, MD;  Location: [facility] ENDOSCOPY;  Service: Endoscopy;  Laterality: N/A;   LEFT HEART CATH AND CORONARY ANGIOGRAPHY N/A 05/07/2018   Procedure: LEFT HEART CATH AND CORONARY ANGIOGRAPHY with possible PCI and stent;  Surgeon: Yolonda Kida, MD;  Location: [place] [facility];  Service: Cardiovascular;  Laterality: N/A;    Home Medications:  Allergies as of 04/18/2022       Reactions   Ace Inhibitors Swelling   Rosiglitazone Other (See Comments)   Other Reaction: SOB, Pulmonary Edema Other Reaction: SOB, Pulmonary Edema Other Reaction: SOB, Pulmonary Edema   Aspirin Swelling   When taken with a beta blocker    Golytely [peg 3350-kcl-nabcb-nacl-nasulf] Swelling        Medication List        Accurate as of April 18, 2022  2:42 PM. If you have any questions, ask your nurse or doctor.          STOP taking these medications     nitrofurantoin 100 MG capsule Commonly known as: Macrodantin Stopped by: Reece Packer, MD   Victoza 18 MG/3ML Sopn Generic drug: liraglutide Stopped by: Reece Packer, MD       TAKE these medications    amLODipine 5 MG tablet Commonly known as: NORVASC Take 5 mg by mouth 2 (two) times daily.   aspirin EC 81 MG tablet Take 81 mg by mouth daily.   atorvastatin 20 MG tablet Commonly known as: LIPITOR Take 20 mg by mouth daily.   carvedilol 6.25 MG tablet Commonly known as: COREG Take 1 tablet (6.25 mg total) by mouth 2 (two) times daily with a meal.   citalopram 20 MG tablet Commonly known as: CELEXA Take 20 mg by mouth daily.   glipiZIDE 5 MG 24 hr tablet  Commonly known as: GLUCOTROL XL Take 5 mg by mouth daily.   metFORMIN 1000 MG tablet Commonly known as: GLUCOPHAGE Take 1 tablet by mouth 2 (two) times daily.   mirabegron ER 50 MG Tb24 tablet Commonly known as: MYRBETRIQ Take 1 tablet (50 mg total) by mouth daily. What changed: Another medication with the same name was removed. Continue taking this medication, and follow the directions you see here. Changed by: Reece Packer, MD   nitroGLYCERIN 0.4 MG SL tablet Commonly known as: NITROSTAT Place under the tongue.   omeprazole 40 MG capsule Commonly known as: PRILOSEC Take 40 mg by mouth daily.   pregabalin 25 MG capsule Commonly known as: LYRICA Take 1 capsule (25 mg total) by mouth in the morning AND 2 capsules (50 mg total) at bedtime.        Allergies:  Allergies  Allergen Reactions   Ace Inhibitors Swelling   Rosiglitazone Other (See Comments)    Other Reaction: SOB, Pulmonary Edema Other Reaction: SOB, Pulmonary Edema Other Reaction: SOB, Pulmonary Edema    Aspirin Swelling    When taken with a beta blocker    Golytely [Peg 3350-Kcl-Nabcb-Nacl-Nasulf] Swelling    Family History: Family History  Problem Relation Age of Onset   Diabetes Mother    Heart failure  Mother    Lung cancer Father    Lung cancer Sister    Lung cancer Brother     Social History:  reports that she has quit smoking. Her smoking use included cigarettes. She has been exposed to tobacco smoke. She has never used smokeless tobacco. She reports that she does not drink alcohol and does not use drugs.  ROS:                                        Physical Exam: BP (!) 143/76   Pulse 63   Ht '5\' 4"'$  (1.626 m)   Wt 79 kg   BMI 29.90 kg/m   Constitutional:  Alert and oriented, No acute distress.   Laboratory Data: Lab Results  Component Value Date   WBC 8.4 07/30/2021   HGB 12.2 07/30/2021   HCT 40.7 07/30/2021   MCV 81.6 07/30/2021   PLT 376 07/30/2021    Lab Results  Component Value Date   CREATININE 0.98 07/30/2021    No results found for: "PSA"  No results found for: "TESTOSTERONE"  Lab Results  Component Value Date   HGBA1C 8.5 (H) 07/18/2021    Urinalysis    Component Value Date/Time   COLORURINE YELLOW (A) 07/30/2021 1112   APPEARANCEUR Clear 02/28/2022 1350   LABSPEC 1.027 07/30/2021 1112   PHURINE 5.0 07/30/2021 1112   GLUCOSEU Negative 02/28/2022 1350   HGBUR NEGATIVE 07/30/2021 1112   BILIRUBINUR Negative 02/28/2022 1350   KETONESUR NEGATIVE 07/30/2021 1112   PROTEINUR 1+ (A) 02/28/2022 1350   PROTEINUR 30 (A) 07/30/2021 1112   NITRITE Negative 02/28/2022 1350   NITRITE NEGATIVE 07/30/2021 1112   LEUKOCYTESUR Negative 02/28/2022 1350   LEUKOCYTESUR NEGATIVE 07/30/2021 1112    Pertinent Imaging:   Assessment & Plan: He says durability in 4 months on Myrbetriq.  Call if urine culture positive.  I may or may not put her on prophylaxis in the future pending future cultures  1. Urinary incontinence, unspecified type  - Urinalysis, Complete   No follow-ups on file.  Reece Packer, MD  [facility] [address] [place], [place] [address] [phone]

## 2022-04-21 LAB — CULTURE, URINE COMPREHENSIVE

## 2022-04-25 ENCOUNTER — Telehealth: Payer: Self-pay

## 2022-04-25 MED ORDER — NITROFURANTOIN MACROCRYSTAL 100 MG PO CAPS: 100.0000 mg | ORAL_CAPSULE | Freq: Two times a day (BID) | ORAL | 0 refills | Status: DC

## 2022-04-25 NOTE — Telephone Encounter (Signed)
Patient advised and RX sent in to the pharmacy. ?

## 2022-04-25 NOTE — Telephone Encounter (Signed)
-----   Message from Bjorn Loser, MD sent at 04/24/2022  1:00 PM EDT ----- Macrodantin 100 mg bid for 7 days ----- Message ----- From: Kris Mouton, CMA Sent: 04/21/2022   3:57 PM EDT To: Bjorn Loser, MD   ----- Message ----- From: Interface, [facility] Lab Results In Sent: 04/18/2022   4:36 PM EST To: Rowe Robert Clinical

## 2022-08-15 ENCOUNTER — Ambulatory Visit (INDEPENDENT_AMBULATORY_CARE_PROVIDER_SITE_OTHER): Payer: 59 | Admitting: Urology

## 2022-08-15 VITALS — BP 115/67 | HR 70 | Ht 64.0 in | Wt 174.0 lb

## 2022-08-15 DIAGNOSIS — R32 Unspecified urinary incontinence: Secondary | ICD-10-CM | POA: Diagnosis not present

## 2022-08-15 LAB — MICROSCOPIC EXAMINATION

## 2022-08-15 LAB — URINALYSIS, COMPLETE
Bilirubin, UA: NEGATIVE
Glucose, UA: NEGATIVE
Leukocytes,UA: NEGATIVE
Nitrite, UA: NEGATIVE
RBC, UA: NEGATIVE
Specific Gravity, UA: 1.02 (ref 1.005–1.030)
Urobilinogen, Ur: 1 mg/dL (ref 0.2–1.0)
pH, UA: 5.5 (ref 5.0–7.5)

## 2022-08-15 MED ORDER — MIRABEGRON ER 50 MG PO TB24
50.0000 mg | ORAL_TABLET | Freq: Every day | ORAL | 3 refills | Status: DC
Start: 2022-08-15 — End: 2023-08-08

## 2022-08-15 NOTE — Progress Notes (Signed)
08/15/2022 9:36 AM   Kerri Mccullough 1945/06/02 [id number]  Referring provider: Gildardo Pounds, PA [address] [place],  [place] [address]  Chief Complaint  Patient presents with   Follow-up   Urinary Incontinence    HPI: as consulted to assist the patient's urinary incontinence worse since a fall and fractured back in June.  She primarily leaks without awareness.  She leaks with coughing sneezing and may be bending lifting.  She really does not have urge incontinence.  She has had she does not really void during the day because it is all in the pad.  She is wearing 4-5 diapers a day moderately wet to soaked.  She wears 3 pads at night due to high-volume enuresis.  She gets up a couple times at night to urinate   She was nonspecific but has milder incontinence prior to the injury wearing 2-3 pads a day.  On further questioning she had stress incontinence but is definitely worse.  She also thinks she has urgency incontinence a year ago but not now.2 she said the leakage well awareness started after the fall   She is on oral hypoglycemics.  She has had a hysterectomy.   No vaginal or perineal numbness.  She may have left-sided sciatica.  She has significant ankle edema bilaterally.   He did have an MRI in June 2023 with an acute or subacute compression fracture of L2 vertebrae and some chronic changes.  It appears she has been managed with conservative therapy but it was nonspecific by history and medical record     Mild grade 2 hypermobility the bladder neck and a mild positive cough test    Patient has mixed incontinence and leakage well awareness. She does not void during the day in many cases due to significant incontinence. History suggestive of neurogenic bladder. She also has bedwetting and stress incontinence. Call if culture positive    Culture was positive On urodynamics patient did not void and was catheterized for few milliliters.  Maximum bladder capacity was 212 mL.   She had increased bladder sensation.  She had an unstable bladder reaching pressure 30 cm of water.  It was associate with urge incontinence.  Her cough leak point pressure 200 mL with 50 cm of water and there was mild leakage.  At 230 mL her Valsalva leak point pressure was 40 cm of water with mild leakage.  During voiding she tried to void by abdominal straining.  It was felt that she generated low pressure contraction of 14 cmH2O but I am highly suspect that this was subtraction artifact.  I do not think she actually did generate a detrusor contraction.  She voided 195 mL with maximal of 8 mL/s.  Residual is 35 mL.  She had interrupted prolonged flow.  EMG activity increased.  Bladder neck descent at 1 to 2 cm.    On pelvic examination she had mild grade 2 hypermobility the bladder neck and she did have a more impressive positive cough test before and after cystoscopy.  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  She does have a small capacity bladder with significant detrusor overactivity.  Theoretically she could have denervated  her bladder neck worsening her stress incontinence.  She should not have a sling since she be a very high risk of retention based upon risk factors and urodynamic findings as well as persistent or worsening overactivity.  A urethral  bulking agent is an option and may need to be used in the future to help reach her treatment goal.  I will see her back on timed voiding fluid modifications on Myrbetriq 50 mg samples and prescription.  Her leakage well awareness could be due to overactivity or urethral insufficiency.  Her high-volume bedwetting would be  due to overactivity.  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  She understands there is not an obvious clear path at this stage.  Based on the mechanism of action detrusor  overactivity is usually the culprit    Today Last 2 urine cultures were positive.  Patient is dramatically better wearing 2 pads during the day and 1 at night.  High-volume bedwetting dramatically better.  Very pleased.  Clinically not infected but urine was suspicious for possible UTI    she says durability in 4 months on Myrbetriq. Call if urine culture positive. I may or may not put her on prophylaxis in the future pending future cultures   Today Frequency stable.  Last culture positive.  Urgency incontinence much improved but still leaks.  We wearing 2 pads a day which is a big improvement has had a lot of itching near the labia.  Patient had mild mobility the bladder neck but no significant prolapse.  Tissues looked healthy.  Labia and surrounding perineal skin looks healthy.  Patient really did not have much dryness    PMH: Past Medical History:  Diagnosis Date   Actinic keratosis    Diabetes mellitus without complication (HCC)    GERD (gastroesophageal reflux disease)    Heart attack (HCC)    High cholesterol    Hypertension    Squamous cell carcinoma of skin    R cheek - treated at [facility]    Surgical History: Past Surgical History:  Procedure Laterality Date   ABDOMINAL HYSTERECTOMY     COLONOSCOPY WITH PROPOFOL N/A 10/14/2019   Procedure: COLONOSCOPY WITH PROPOFOL;  Surgeon: Regis Bill, MD;  Location: [facility] ENDOSCOPY;  Service: Endoscopy;  Laterality: N/A;   CORONARY ANGIOPLASTY WITH STENT PLACEMENT     ESOPHAGOGASTRODUODENOSCOPY (EGD) WITH PROPOFOL N/A 10/14/2019   Procedure: ESOPHAGOGASTRODUODENOSCOPY (EGD) WITH PROPOFOL;  Surgeon: Regis Bill, MD;  Location: [facility] ENDOSCOPY;  Service: Endoscopy;  Laterality: N/A;   LEFT HEART CATH AND CORONARY ANGIOGRAPHY N/A 05/07/2018   Procedure: LEFT HEART CATH AND CORONARY ANGIOGRAPHY with possible PCI and stent;  Surgeon: Alwyn Pea, MD;  Location: [facility];  Service: Cardiovascular;  Laterality:  N/A;    Home Medications:  Allergies as of 08/15/2022       Reactions   Ace Inhibitors Swelling   Rosiglitazone Other (See Comments)   Other Reaction: SOB, Pulmonary Edema Other Reaction: SOB, Pulmonary Edema Other Reaction: SOB, Pulmonary Edema   Aspirin Swelling   When taken with a beta blocker    Golytely [peg 3350-kcl-nabcb-nacl-nasulf] Swelling        Medication List        Accurate as of August 15, 2022  9:36 AM. If you have any questions, ask your nurse or doctor.          STOP taking these medications    nitrofurantoin 100 MG capsule Commonly known as: Macrodantin   nitroGLYCERIN 0.4 MG SL tablet Commonly known as: NITROSTAT       TAKE these medications    amLODipine 5 MG tablet Commonly known as: NORVASC  Take 5 mg by mouth 2 (two) times daily.   aspirin EC 81 MG tablet Take 81 mg by mouth daily.   atorvastatin 20 MG tablet Commonly known as: LIPITOR Take 20 mg by mouth daily.   carvedilol 6.25 MG tablet Commonly known as: COREG Take 1 tablet (6.25 mg total) by mouth 2 (two) times daily with a meal.   citalopram 20 MG tablet Commonly known as: CELEXA Take 20 mg by mouth daily.   glipiZIDE 5 MG 24 hr tablet Commonly known as: GLUCOTROL XL Take 5 mg by mouth daily.   metFORMIN 1000 MG tablet Commonly known as: GLUCOPHAGE Take 1 tablet by mouth 2 (two) times daily.   mirabegron ER 50 MG Tb24 tablet Commonly known as: MYRBETRIQ Take 1 tablet (50 mg total) by mouth daily.   naproxen 500 MG tablet Commonly known as: NAPROSYN Take 500 mg by mouth 2 (two) times daily.   omeprazole 40 MG capsule Commonly known as: PRILOSEC Take 40 mg by mouth daily.   pregabalin 25 MG capsule Commonly known as: LYRICA Take 1 capsule (25 mg total) by mouth in the morning AND 2 capsules (50 mg total) at bedtime.        Allergies:  Allergies  Allergen Reactions   Ace Inhibitors Swelling   Rosiglitazone Other (See Comments)    Other Reaction: SOB,  Pulmonary Edema Other Reaction: SOB, Pulmonary Edema Other Reaction: SOB, Pulmonary Edema    Aspirin Swelling    When taken with a beta blocker    Golytely [Peg 3350-Kcl-Nabcb-Nacl-Nasulf] Swelling    Family History: Family History  Problem Relation Age of Onset   Diabetes Mother    Heart failure Mother    Lung cancer Father    Lung cancer Sister    Lung cancer Brother     Social History:  reports that she has quit smoking. Her smoking use included cigarettes. She has been exposed to tobacco smoke. She has never used smokeless tobacco. She reports that she does not drink alcohol and does not use drugs.  ROS:                                        Physical Exam: BP (!) 147/73   Pulse 75   Ht 5\' 4"  (1.626 m)   Wt 78.9 kg   BMI 29.87 kg/m   Constitutional:  Alert and oriented, No acute distress. HEENT: [place] AT, moist mucus membranes.  Trachea midline, no masses.   Laboratory Data: Lab Results  Component Value Date   WBC 8.4 07/30/2021   HGB 12.2 07/30/2021   HCT 40.7 07/30/2021   MCV 81.6 07/30/2021   PLT 376 07/30/2021    Lab Results  Component Value Date   CREATININE 0.98 07/30/2021    No results found for: "PSA"  No results found for: "TESTOSTERONE"  Lab Results  Component Value Date   HGBA1C 8.5 (H) 07/18/2021    Urinalysis    Component Value Date/Time   COLORURINE YELLOW (A) 07/30/2021 1112   APPEARANCEUR Hazy (A) 04/18/2022 1423   LABSPEC 1.027 07/30/2021 1112   PHURINE 5.0 07/30/2021 1112   GLUCOSEU Negative 04/18/2022 1423   HGBUR NEGATIVE 07/30/2021 1112   BILIRUBINUR Negative 04/18/2022 1423   KETONESUR NEGATIVE 07/30/2021 1112   PROTEINUR 1+ (A) 04/18/2022 1423   PROTEINUR 30 (A) 07/30/2021 1112   NITRITE Negative 04/18/2022 1423   NITRITE NEGATIVE 07/30/2021  1112   LEUKOCYTESUR Negative 04/18/2022 1423   LEUKOCYTESUR NEGATIVE 07/30/2021 1112    Pertinent Imaging: Urine reviewed and sent for  culture  Assessment & Plan: Continue on Myrbetriq.  I decided not to put her on prophylaxis yet but will if she gets another infection in the next several months.  I discussed with her today.  Role of moisturizer cream for the labia and perineal skin versus see a gynecologist discussed.  Prescription of Myrbetriq 90 x 3 renewed and I will see in a year Pathophysiology of UTIs discussed.  Probiotics discussed.  If the culture is positive I will call in the once a day and they agreed.  Otherwise seen a year  1. Urinary incontinence, unspecified type  - Urinalysis, Complete   No follow-ups on file.  Martina Sinner, MD  [facility] [address] [place], [place] [address] [phone]

## 2022-08-18 LAB — CULTURE, URINE COMPREHENSIVE

## 2022-08-22 ENCOUNTER — Other Ambulatory Visit: Payer: Self-pay

## 2022-08-22 DIAGNOSIS — Z2989 Encounter for other specified prophylactic measures: Secondary | ICD-10-CM

## 2022-08-22 DIAGNOSIS — N3000 Acute cystitis without hematuria: Secondary | ICD-10-CM

## 2022-08-22 MED ORDER — NITROFURANTOIN MACROCRYSTAL 100 MG PO CAPS
100.0000 mg | ORAL_CAPSULE | Freq: Every day | ORAL | 11 refills | Status: DC
Start: 2022-08-22 — End: 2023-08-16

## 2022-08-22 MED ORDER — CEFDINIR 300 MG PO CAPS
300.0000 mg | ORAL_CAPSULE | Freq: Two times a day (BID) | ORAL | 0 refills | Status: DC
Start: 2022-08-22 — End: 2022-12-06

## 2022-08-31 ENCOUNTER — Other Ambulatory Visit: Payer: Self-pay | Admitting: Internal Medicine

## 2022-08-31 DIAGNOSIS — E785 Hyperlipidemia, unspecified: Secondary | ICD-10-CM

## 2022-09-02 ENCOUNTER — Ambulatory Visit
Admission: RE | Admit: 2022-09-02 | Discharge: 2022-09-02 | Disposition: A | Discharge status: Home / Self Care | Payer: 59 | Source: Ambulatory Visit | Attending: Internal Medicine | Admitting: Internal Medicine

## 2022-09-02 DIAGNOSIS — E785 Hyperlipidemia, unspecified: Secondary | ICD-10-CM | POA: Insufficient documentation

## 2022-09-22 ENCOUNTER — Other Ambulatory Visit: Payer: Self-pay | Admitting: Internal Medicine

## 2022-09-22 DIAGNOSIS — R931 Abnormal findings on diagnostic imaging of heart and coronary circulation: Secondary | ICD-10-CM

## 2022-09-28 ENCOUNTER — Telehealth (HOSPITAL_COMMUNITY): Payer: Self-pay | Admitting: *Deleted

## 2022-09-28 NOTE — Telephone Encounter (Signed)
Reaching out to patient to offer assistance regarding upcoming cardiac imaging study; pt verbalizes understanding of appt date/time, parking situation and where to check in, pre-test NPO status and medications ordered, and verified current allergies; name and call back number provided for further questions should they arise Hayley Sharpe RN Navigator Cardiac Imaging [facility] [phone] office [phone] cell  

## 2022-09-29 ENCOUNTER — Ambulatory Visit: Admission: RE | Admit: 2022-09-29 | Payer: 59 | Source: Ambulatory Visit

## 2022-10-05 ENCOUNTER — Telehealth (HOSPITAL_COMMUNITY): Payer: Self-pay | Admitting: *Deleted

## 2022-10-05 ENCOUNTER — Other Ambulatory Visit (HOSPITAL_COMMUNITY): Payer: Self-pay | Admitting: *Deleted

## 2022-10-05 ENCOUNTER — Encounter (HOSPITAL_COMMUNITY): Payer: Self-pay

## 2022-10-05 MED ORDER — METOPROLOL TARTRATE 100 MG PO TABS: ORAL_TABLET | ORAL | 0 refills | Status: DC

## 2022-10-05 NOTE — Telephone Encounter (Signed)
Attempted to call patient regarding upcoming cardiac CT appointment. Voicemail not set up to leave a message.  Merle Prescott RN Navigator Cardiac Imaging [facility] [phone] Office [phone] Cell  

## 2022-10-06 ENCOUNTER — Ambulatory Visit
Admission: RE | Admit: 2022-10-06 | Discharge: 2022-10-06 | Disposition: A | Discharge status: Home / Self Care | Payer: 59 | Source: Ambulatory Visit | Attending: Internal Medicine | Admitting: Internal Medicine

## 2022-10-06 DIAGNOSIS — R0789 Other chest pain: Secondary | ICD-10-CM | POA: Insufficient documentation

## 2022-10-06 DIAGNOSIS — I700 Atherosclerosis of aorta: Secondary | ICD-10-CM | POA: Diagnosis not present

## 2022-10-06 DIAGNOSIS — R931 Abnormal findings on diagnostic imaging of heart and coronary circulation: Secondary | ICD-10-CM | POA: Diagnosis present

## 2022-10-06 DIAGNOSIS — Z955 Presence of coronary angioplasty implant and graft: Secondary | ICD-10-CM | POA: Insufficient documentation

## 2022-10-06 DIAGNOSIS — I2510 Atherosclerotic heart disease of native coronary artery without angina pectoris: Secondary | ICD-10-CM | POA: Diagnosis not present

## 2022-10-06 LAB — POCT I-STAT CREATININE: Creatinine, Ser: 1 mg/dL (ref 0.44–1.00)

## 2022-10-06 MED ORDER — NITROGLYCERIN 0.4 MG SL SUBL
0.8000 mg | SUBLINGUAL_TABLET | Freq: Once | SUBLINGUAL | Status: AC
  Administered 2022-10-06: 0.8 mg via SUBLINGUAL

## 2022-10-06 MED ORDER — SODIUM CHLORIDE 0.9 % IV SOLN: INTRAVENOUS | Status: DC

## 2022-10-06 MED ORDER — IOHEXOL 350 MG/ML SOLN
75.0000 mL | Freq: Once | INTRAVENOUS | Status: AC | PRN
  Administered 2022-10-06: 75 mL via INTRAVENOUS

## 2022-10-06 NOTE — Progress Notes (Signed)
Patient tolerated CT well. Drank water after. Vital signs stable encourage to drink water throughout day.Reasons explained and verbalized understanding. Ambulated steady gait.  

## 2022-11-15 IMAGING — MR MR LUMBAR SPINE W/O CM
5 series · 30 of 48 positions shown · non-contrast
Comparison: CT abdomen/pelvis 07/18/2021

CLINICAL DATA: Low back pain bilateral hip pain, fall last [REDACTED]

EXAM:
MRI LUMBAR SPINE WITHOUT CONTRAST
TECHNIQUE: Multiplanar, multisequence MR imaging of the lumbar spine was
performed. No intravenous contrast was administered.

[Series 5: T2 · sagittal · 4.0mm · 0.88mm/px · 6 of 17 slices shown (1 of 2)]
[im 1/17]
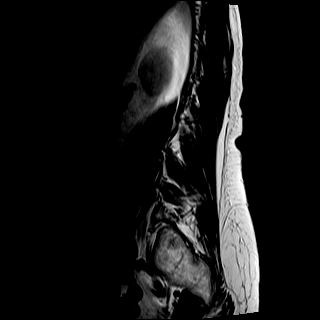
[im 4/17]
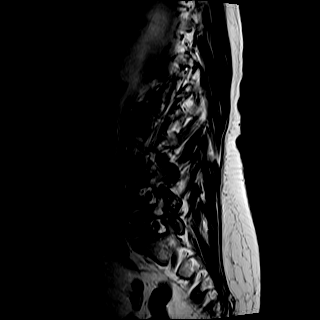
[im 7/17]
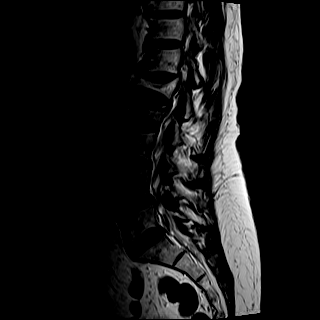
[im 10/17]
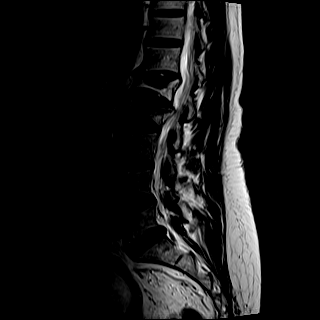
[im 13/17]
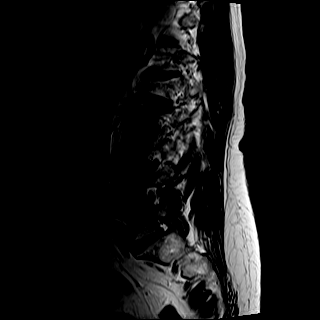
[im 17/17]
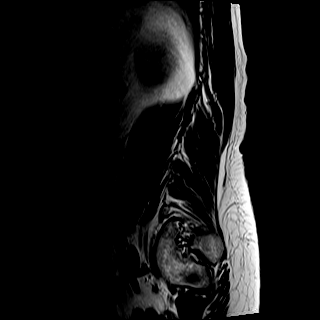

[Series 6: T1 · sagittal · 4.0mm · 0.88mm/px · 7 of 17 slices shown (1 of 2)]
[im 1/17]
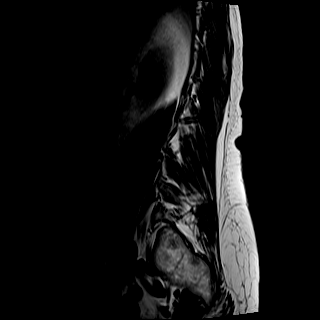
[im 3/17]
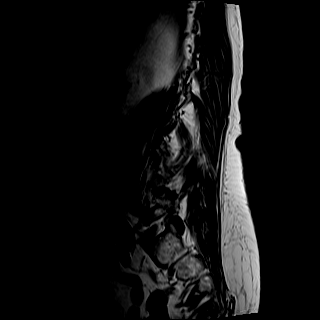
[im 6/17]
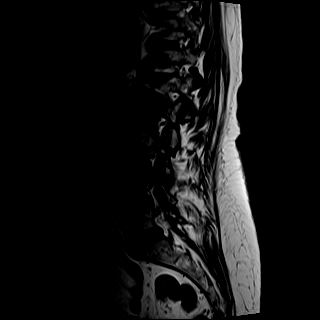
[im 9/17]
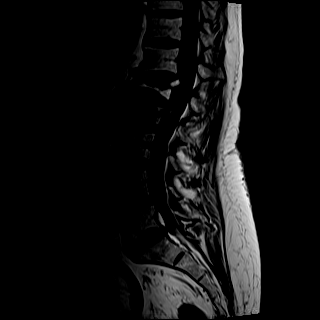
[im 11/17]
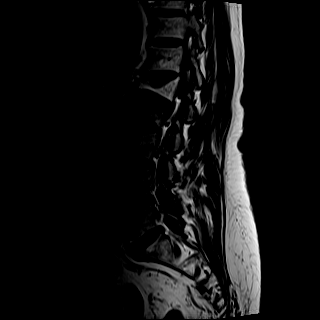
[im 14/17]
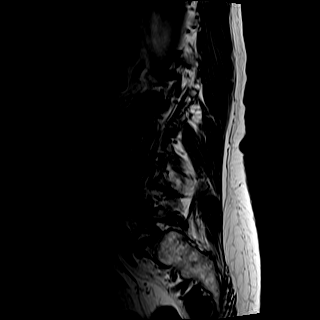
[im 17/17]
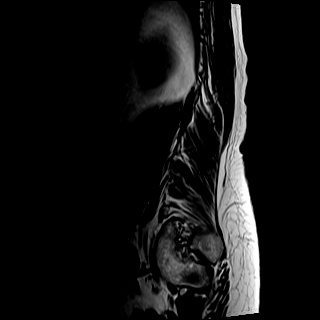

[Series 7: STIR · sagittal · 4.0mm · 0.44mm/px · 1 of 17 slices shown]
[im 1/17]
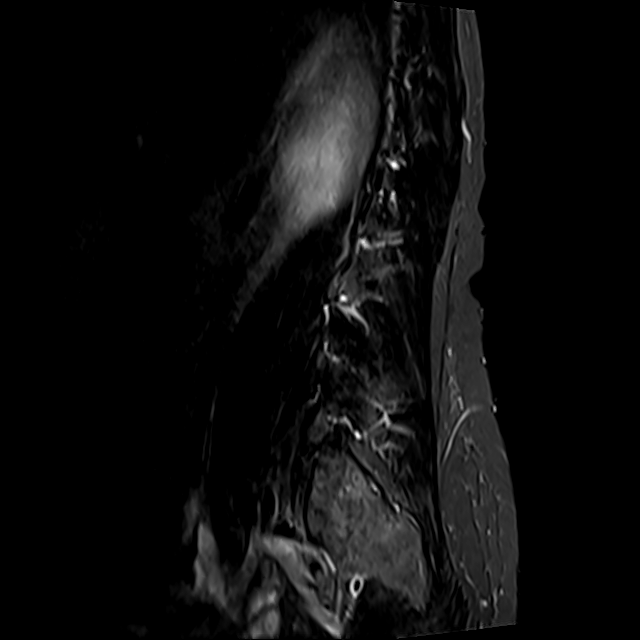

[Series 8: T2 · axial · 4.0mm · 0.78mm/px · z∈[-39,+178]mm · 8 of 36 slices shown (2 of 2)]
[im 1/36]
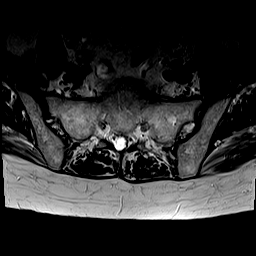
[im 6/36]
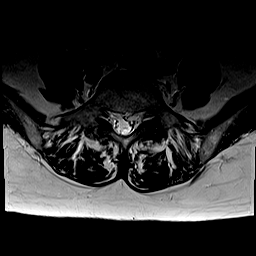
[im 11/36]
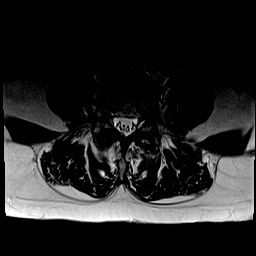
[im 17/36]
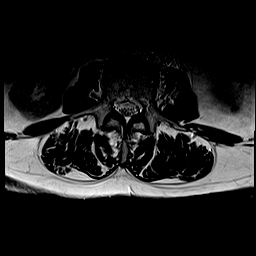
[im 19/36]
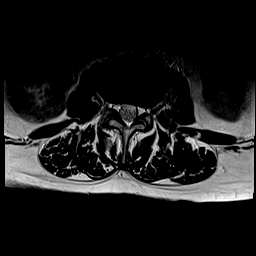
[im 25/36]
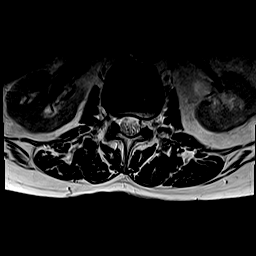
[im 30/36]
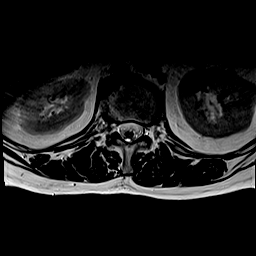
[im 36/36]
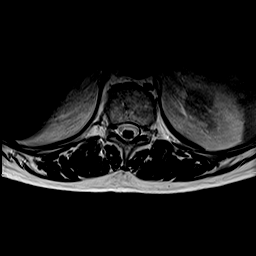

[Series 9: T1 · axial · 4.0mm · 0.39mm/px · z∈[-39,+178]mm · 8 of 36 slices shown (2 of 2)]
[im 1/36]
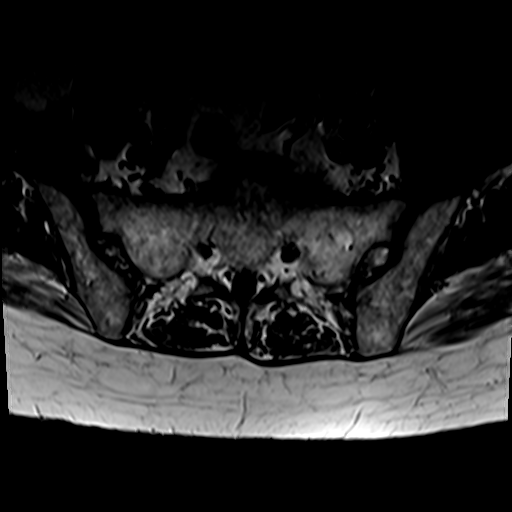
[im 6/36]
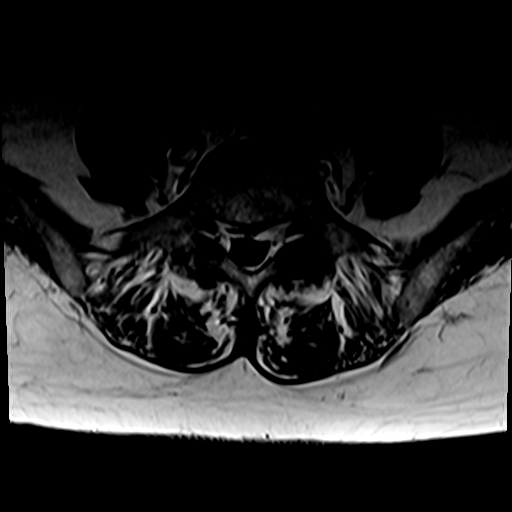
[im 11/36]
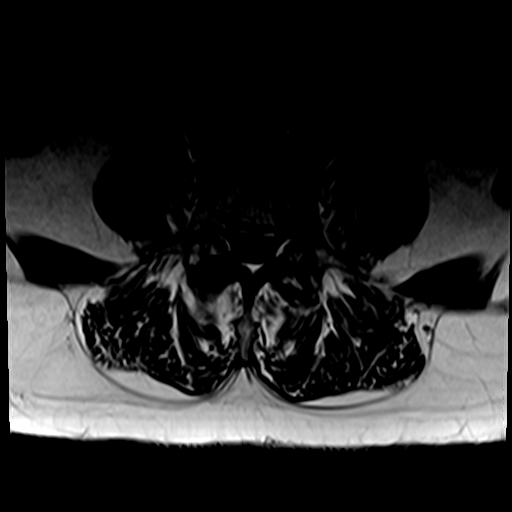
[im 17/36]
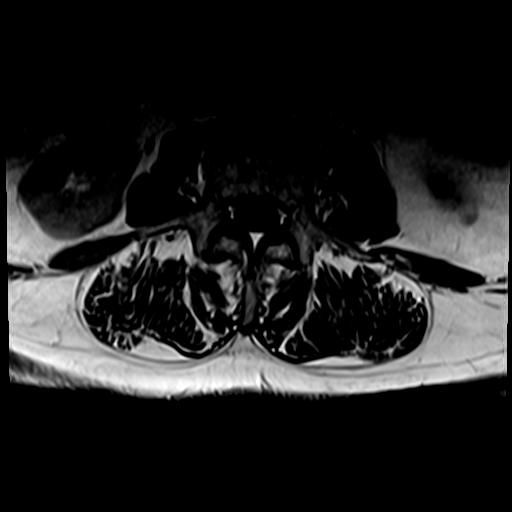
[im 19/36]
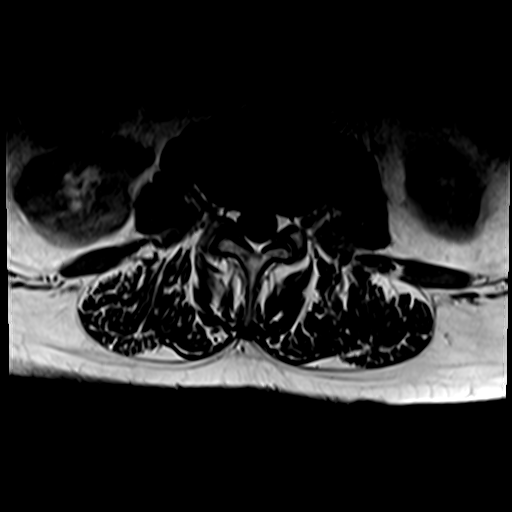
[im 25/36]
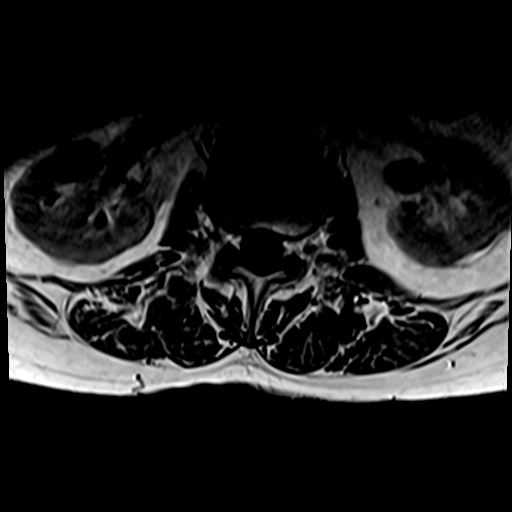
[im 30/36]
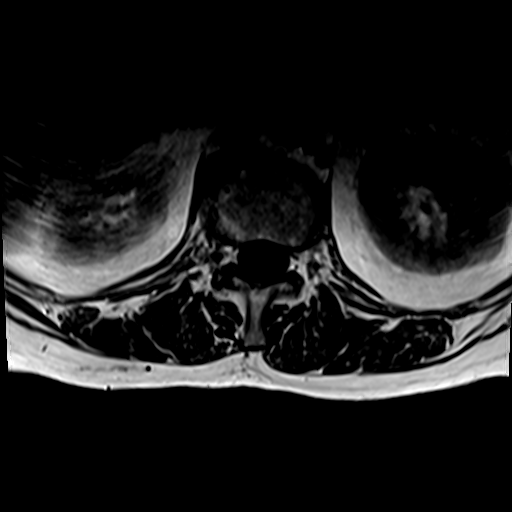
[im 36/36]
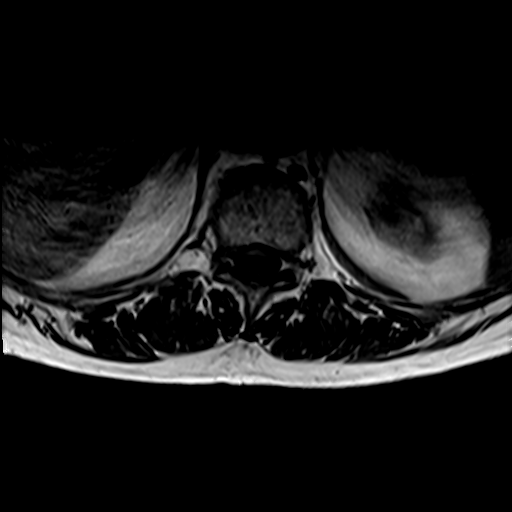

[30 of 48 positions shown; findings below may reference images not displayed]

FINDINGS: Segmentation: Standard; the lowest formed disc space is designated
L5-S1.

Alignment:  There is no significant antero or retrolisthesis.

Vertebrae: There is severe compression deformity of the L1 vertebral
body with up to approximately 70-80% loss of vertebral body height,
similar to the prior CT from 07/18/2021, and not significantly
changed since 8383. There is mild bony retropulsion resulting in
mild spinal canal stenosis without conus/cauda equina nerve root
compression.

There is an acute to subacute compression fracture of the L2
vertebral body with up to approximately 40% loss of vertebral body
height, worsened since 07/18/2021. Bony retropulsion at this level
is new since 07/18/2021 resulting in mild-to-moderate spinal canal
stenosis with crowding of the subarticular zones but no frank nerve
root compression.

The other vertebral body heights are preserved, without other acute
fracture. Background marrow signal is within normal limits. There is
no suspicious marrow signal abnormality.

Conus medullaris and cauda equina: Conus extends to the L1 level.
Conus and cauda equina appear normal.

Paraspinal and other soft tissues: Unremarkable.

Disc levels:

T12-L1: As above, there is mild bony retropulsion resulting in mild
spinal canal stenosis without significant neural foraminal stenosis

L2-L3: As above, there is bony retropulsion resulting in
mild-to-moderate spinal canal stenosis with crowding of the
subarticular zones but no significant neural foraminal stenosis

L2-L3: No significant spinal canal or neural foraminal stenosis

L3-L4: Mild disc bulge eccentric to the left and bilateral facet
arthropathy results in mild left worse than right neural foraminal
stenosis without significant spinal canal stenosis.

L4-L5: There is a mild disc bulge and bilateral facet arthropathy
resulting in mild left worse than right neural foraminal stenosis
without significant spinal canal stenosis

L5-S1: There is a mild disc bulge eccentric to the left with a
central annular fissure and bilateral facet arthropathy resulting in
mild-to-moderate left and mild right neural foraminal stenosis
without significant spinal canal stenosis.
IMPRESSION: 1. Acute to subacute compression fracture of the L2 vertebral body
with up to approximately 40% loss of vertebral body height and mild
bony retropulsion, worsened since 07/18/2021 when the fracture was
likely acute. There is mild-to-moderate spinal canal stenosis
without frank nerve root compression.
2. Unchanged chronic marked compression deformity of the L1
vertebral body with minimal bony retropulsion.
3. Multilevel degenerative changes as above without high-grade
spinal canal or neural foraminal stenosis.

## 2022-11-18 ENCOUNTER — Encounter (INDEPENDENT_AMBULATORY_CARE_PROVIDER_SITE_OTHER): Payer: Medicare Other | Admitting: Vascular Surgery

## 2022-12-06 ENCOUNTER — Other Ambulatory Visit (INDEPENDENT_AMBULATORY_CARE_PROVIDER_SITE_OTHER): Payer: Self-pay | Admitting: Vascular Surgery

## 2022-12-06 ENCOUNTER — Ambulatory Visit (INDEPENDENT_AMBULATORY_CARE_PROVIDER_SITE_OTHER): Payer: 59 | Admitting: Vascular Surgery

## 2022-12-06 ENCOUNTER — Encounter (INDEPENDENT_AMBULATORY_CARE_PROVIDER_SITE_OTHER): Payer: Self-pay | Admitting: Vascular Surgery

## 2022-12-06 ENCOUNTER — Other Ambulatory Visit (INDEPENDENT_AMBULATORY_CARE_PROVIDER_SITE_OTHER): Payer: 59

## 2022-12-06 VITALS — BP 162/76 | HR 59 | Resp 18 | Ht 64.0 in | Wt 169.6 lb

## 2022-12-06 DIAGNOSIS — M79604 Pain in right leg: Secondary | ICD-10-CM | POA: Diagnosis not present

## 2022-12-06 DIAGNOSIS — M25562 Pain in left knee: Secondary | ICD-10-CM | POA: Diagnosis not present

## 2022-12-06 DIAGNOSIS — I10 Essential (primary) hypertension: Secondary | ICD-10-CM | POA: Diagnosis not present

## 2022-12-06 DIAGNOSIS — M79609 Pain in unspecified limb: Secondary | ICD-10-CM | POA: Insufficient documentation

## 2022-12-06 DIAGNOSIS — M79605 Pain in left leg: Secondary | ICD-10-CM

## 2022-12-06 DIAGNOSIS — E785 Hyperlipidemia, unspecified: Secondary | ICD-10-CM

## 2022-12-06 DIAGNOSIS — E1165 Type 2 diabetes mellitus with hyperglycemia: Secondary | ICD-10-CM | POA: Diagnosis not present

## 2022-12-06 DIAGNOSIS — M25561 Pain in right knee: Secondary | ICD-10-CM

## 2022-12-06 NOTE — Progress Notes (Signed)
Patient ID: Kerri Mccullough, female   DOB: Nov 03, 1945, 77 y.o.   MRN: [id number]  Chief Complaint  Patient presents with   New Patient (Initial Visit)    np. consult. bilateral leg numbness. callwood, dwayne    HPI Kerri Mccullough is a 77 y.o. female.  I am asked to see the patient by Dr. Juliann Pares for evaluation of leg numbness and weakness.  This is associated with swelling, particularly on the left side.  The seem to worsen after a traumatic injury where she had a spinal fracture per her report.  Both lower extremities are affected.  Sitting or riding in a car for long periods of time seem to exacerbate the symptoms.  She does not have open wounds or ulceration.  No fevers or chills.  No chest pain or shortness of breath.  She does have multiple atherosclerotic risk factors as listed below.     Past Medical History:  Diagnosis Date   Actinic keratosis    Diabetes mellitus without complication (HCC)    GERD (gastroesophageal reflux disease)    Heart attack (HCC)    High cholesterol    Hypertension    Squamous cell carcinoma of skin    R cheek - treated at [facility]    Past Surgical History:  Procedure Laterality Date   ABDOMINAL HYSTERECTOMY     COLONOSCOPY WITH PROPOFOL N/A 10/14/2019   Procedure: COLONOSCOPY WITH PROPOFOL;  Surgeon: Regis Bill, MD;  Location: [facility] ENDOSCOPY;  Service: Endoscopy;  Laterality: N/A;   CORONARY ANGIOPLASTY WITH STENT PLACEMENT     ESOPHAGOGASTRODUODENOSCOPY (EGD) WITH PROPOFOL N/A 10/14/2019   Procedure: ESOPHAGOGASTRODUODENOSCOPY (EGD) WITH PROPOFOL;  Surgeon: Regis Bill, MD;  Location: [facility] ENDOSCOPY;  Service: Endoscopy;  Laterality: N/A;   LEFT HEART CATH AND CORONARY ANGIOGRAPHY N/A 05/07/2018   Procedure: LEFT HEART CATH AND CORONARY ANGIOGRAPHY with possible PCI and stent;  Surgeon: Alwyn Pea, MD;  Location: [facility] INVASIVE CV LAB;  Service: Cardiovascular;  Laterality: N/A;     Family History  Problem Relation Age  of Onset   Diabetes Mother    Heart failure Mother    Lung cancer Father    Lung cancer Sister    Lung cancer Brother       Social History   Tobacco Use   Smoking status: Former    Types: Cigarettes    Passive exposure: Past   Smokeless tobacco: Never  Substance Use Topics   Alcohol use: No   Drug use: No     Allergies  Allergen Reactions   Ace Inhibitors Swelling   Rosiglitazone Other (See Comments)    Other Reaction: SOB, Pulmonary Edema Other Reaction: SOB, Pulmonary Edema Other Reaction: SOB, Pulmonary Edema    Aspirin Swelling    When taken with a beta blocker    Golytely [Peg 3350-Kcl-Nabcb-Nacl-Nasulf] Swelling    Current Outpatient Medications  Medication Sig Dispense Refill   amLODipine (NORVASC) 5 MG tablet Take 5 mg by mouth 2 (two) times daily.     aspirin EC 81 MG tablet Take 81 mg by mouth daily.     atorvastatin (LIPITOR) 20 MG tablet Take 20 mg by mouth daily.     carvedilol (COREG) 6.25 MG tablet Take 1 tablet (6.25 mg total) by mouth 2 (two) times daily with a meal. 60 tablet 0   citalopram (CELEXA) 20 MG tablet Take 20 mg by mouth daily.     glipiZIDE (GLUCOTROL XL) 5 MG 24 hr tablet Take  5 mg by mouth daily.     losartan (COZAAR) 50 MG tablet Take 1 tablet by mouth daily.     metFORMIN (GLUCOPHAGE) 1000 MG tablet Take 1 tablet by mouth 2 (two) times daily.     mirabegron ER (MYRBETRIQ) 50 MG TB24 tablet Take 1 tablet (50 mg total) by mouth daily. 90 tablet 3   nitrofurantoin (MACRODANTIN) 100 MG capsule Take 1 capsule (100 mg total) by mouth daily. 30 capsule 11   omeprazole (PRILOSEC) 40 MG capsule Take 40 mg by mouth daily.     OZEMPIC, 0.25 OR 0.5 MG/DOSE, 2 MG/3ML SOPN Inject into the skin.     pregabalin (LYRICA) 25 MG capsule Take 1 capsule (25 mg total) by mouth in the morning AND 2 capsules (50 mg total) at bedtime. 90 capsule 0   No current facility-administered medications for this visit.      REVIEW OF SYSTEMS (Negative unless  checked)  Constitutional: [] Weight loss  [] Fever  [] Chills Cardiac: [] Chest pain   [] Chest pressure   [] Palpitations   [] Shortness of breath when laying flat   [] Shortness of breath at rest   [x] Shortness of breath with exertion. Vascular:  [x] Pain in legs with walking   [x] Pain in legs at rest   [] Pain in legs when laying flat   [] Claudication   [] Pain in feet when walking  [] Pain in feet at rest  [] Pain in feet when laying flat   [] History of DVT   [] Phlebitis   [] Swelling in legs   [] Varicose veins   [] Non-healing ulcers Pulmonary:   [] Uses home oxygen   [] Productive cough   [] Hemoptysis   [] Wheeze  [] COPD   [] Asthma Neurologic:  [] Dizziness  [] Blackouts   [] Seizures   [] History of stroke   [] History of TIA  [] Aphasia   [] Temporary blindness   [] Dysphagia   [] Weakness or numbness in arms   [x] Weakness or numbness in legs Musculoskeletal:  [] Arthritis   [] Joint swelling   [] Joint pain   [] Low back pain Hematologic:  [] Easy bruising  [] Easy bleeding   [] Hypercoagulable state   [] Anemic  [] Hepatitis Gastrointestinal:  [] Blood in stool   [] Vomiting blood  [] Gastroesophageal reflux/heartburn   [] Abdominal pain Genitourinary:  [] Chronic kidney disease   [] Difficult urination  [] Frequent urination  [] Burning with urination   [] Hematuria Skin:  [] Rashes   [] Ulcers   [] Wounds Psychological:  [] History of anxiety   []  History of major depression.    Physical Exam BP (!) 162/76 (BP Location: Left Arm)   Pulse (!) 59   Resp 18   Ht 5\' 4"  (1.626 m)   Wt 169 lb 9.6 oz (76.9 kg)   BMI 29.11 kg/m  Gen:  WD/WN, NAD Head: [place] Level/AT, No temporalis wasting.  Ear/Nose/Throat: Hearing grossly intact, nares w/o erythema or drainage, oropharynx w/o Erythema/Exudate Eyes: Conjunctiva clear, sclera non-icteric  Neck: trachea midline.  No JVD.  Pulmonary:  Good air movement, respirations not labored, no use of accessory muscles  Cardiac: RRR, no JVD Vascular:  Vessel Right Left  Radial Palpable Palpable                           DP 2+ 2+  PT 1+ 1+   Gastrointestinal:. No masses, surgical incisions, or scars. Musculoskeletal: M/S 5/5 throughout.  Extremities without ischemic changes.  No deformity or atrophy. No appreciable edema this morning. Neurologic: Sensation grossly intact in extremities.  Symmetrical.  Speech is fluent. Motor exam as listed above.  Psychiatric: Judgment intact, Mood & affect appropriate for pt's clinical situation. Dermatologic: No rashes or ulcers noted.  No cellulitis or open wounds.    Radiology No results found.  Labs Recent Results (from the past 2160 hour(s))  I-STAT creatinine     Status: None   Collection Time: 10/06/22 11:46 AM  Result Value Ref Range   Creatinine, Ser 1.00 0.44 - 1.00 mg/dL    Assessment/Plan:  Pain in limb The patient has lower extremity pain that is not entirely clear in its etiology.  It sounds more like neuropathic pain, but with the patient with multiple atherosclerotic risk factors arterial insufficiency could be playing a role as well.  To assess this, noninvasive studies were performed today.  ABIs were normal at 1.1 bilaterally with triphasic waveforms and normal digital pressures consistent with no arterial insufficiency.  As such, I suspect her lower extremity symptoms are related to neuropathic issues.  No further vascular workup is planned at this time.  Follow-up as needed.  Hyperlipidemia lipid control important in reducing the progression of atherosclerotic disease. Continue statin therapy   Diabetes mellitus (HCC) blood glucose control important in reducing the progression of atherosclerotic disease. Also, involved in wound healing. On appropriate medications.   Hypertension blood pressure control important in reducing the progression of atherosclerotic disease. On appropriate oral medications.      Festus Barren 12/06/2022, 1:21 PM   This note was created with Dragon medical transcription system.  Any errors  from dictation are unintentional.

## 2022-12-06 NOTE — Assessment & Plan Note (Signed)
lipid control important in reducing the progression of atherosclerotic disease. Continue statin therapy  

## 2022-12-06 NOTE — Assessment & Plan Note (Signed)
blood glucose control important in reducing the progression of atherosclerotic disease. Also, involved in wound healing. On appropriate medications.  

## 2022-12-06 NOTE — Assessment & Plan Note (Signed)
blood pressure control important in reducing the progression of atherosclerotic disease. On appropriate oral medications.  

## 2022-12-06 NOTE — Assessment & Plan Note (Signed)
The patient has lower extremity pain that is not entirely clear in its etiology.  It sounds more like neuropathic pain, but with the patient with multiple atherosclerotic risk factors arterial insufficiency could be playing a role as well.  To assess this, noninvasive studies were performed today.  ABIs were normal at 1.1 bilaterally with triphasic waveforms and normal digital pressures consistent with no arterial insufficiency.  As such, I suspect her lower extremity symptoms are related to neuropathic issues.  No further vascular workup is planned at this time.  Follow-up as needed.

## 2023-02-06 ENCOUNTER — Emergency Department: Payer: 59

## 2023-02-06 ENCOUNTER — Encounter: Payer: Self-pay | Admitting: Emergency Medicine

## 2023-02-06 ENCOUNTER — Other Ambulatory Visit: Payer: Self-pay

## 2023-02-06 ENCOUNTER — Emergency Department
Admission: EM | Admit: 2023-02-06 | Discharge: 2023-02-07 | Disposition: A | Discharge status: Home / Self Care | Payer: 59 | Attending: Emergency Medicine | Admitting: Emergency Medicine

## 2023-02-06 DIAGNOSIS — I6782 Cerebral ischemia: Secondary | ICD-10-CM | POA: Insufficient documentation

## 2023-02-06 DIAGNOSIS — R410 Disorientation, unspecified: Secondary | ICD-10-CM | POA: Insufficient documentation

## 2023-02-06 DIAGNOSIS — I2510 Atherosclerotic heart disease of native coronary artery without angina pectoris: Secondary | ICD-10-CM | POA: Diagnosis not present

## 2023-02-06 DIAGNOSIS — R2681 Unsteadiness on feet: Secondary | ICD-10-CM | POA: Diagnosis not present

## 2023-02-06 DIAGNOSIS — E119 Type 2 diabetes mellitus without complications: Secondary | ICD-10-CM | POA: Diagnosis not present

## 2023-02-06 DIAGNOSIS — R202 Paresthesia of skin: Secondary | ICD-10-CM | POA: Diagnosis not present

## 2023-02-06 DIAGNOSIS — I10 Essential (primary) hypertension: Secondary | ICD-10-CM | POA: Diagnosis not present

## 2023-02-06 DIAGNOSIS — E785 Hyperlipidemia, unspecified: Secondary | ICD-10-CM | POA: Insufficient documentation

## 2023-02-06 DIAGNOSIS — R200 Anesthesia of skin: Secondary | ICD-10-CM | POA: Insufficient documentation

## 2023-02-06 LAB — CBC
HCT: 32.1 % — ABNORMAL LOW (ref 36.0–46.0)
Hemoglobin: 9.9 g/dL — ABNORMAL LOW (ref 12.0–15.0)
MCH: 26.8 pg (ref 26.0–34.0)
MCHC: 30.8 g/dL (ref 30.0–36.0)
MCV: 87 fL (ref 80.0–100.0)
Platelets: 193 10*3/uL (ref 150–400)
RBC: 3.69 MIL/uL — ABNORMAL LOW (ref 3.87–5.11)
RDW: 16.8 % — ABNORMAL HIGH (ref 11.5–15.5)
WBC: 8.3 10*3/uL (ref 4.0–10.5)
nRBC: 0 % (ref 0.0–0.2)

## 2023-02-06 LAB — COMPREHENSIVE METABOLIC PANEL
ALT: 11 U/L (ref 0–44)
AST: 14 U/L — ABNORMAL LOW (ref 15–41)
Albumin: 3.5 g/dL (ref 3.5–5.0)
Alkaline Phosphatase: 63 U/L (ref 38–126)
Anion gap: 11 (ref 5–15)
BUN: 11 mg/dL (ref 8–23)
CO2: 22 mmol/L (ref 22–32)
Calcium: 8.5 mg/dL — ABNORMAL LOW (ref 8.9–10.3)
Chloride: 98 mmol/L (ref 98–111)
Creatinine, Ser: 1.23 mg/dL — ABNORMAL HIGH (ref 0.44–1.00)
GFR, Estimated: 46 mL/min — ABNORMAL LOW (ref >60)
Glucose, Bld: 161 mg/dL — ABNORMAL HIGH (ref 70–99)
Potassium: 3.5 mmol/L (ref 3.5–5.1)
Sodium: 131 mmol/L — ABNORMAL LOW (ref 135–145)
Total Bilirubin: 0.5 mg/dL (ref <1.2)
Total Protein: 7.3 g/dL (ref 6.5–8.1)

## 2023-02-06 LAB — DIFFERENTIAL
Abs Immature Granulocytes: 0.02 K/uL (ref 0.00–0.07)
Basophils Absolute: 0.1 K/uL (ref 0.0–0.1)
Basophils Relative: 1 %
Eosinophils Absolute: 0.1 K/uL (ref 0.0–0.5)
Eosinophils Relative: 1 %
Immature Granulocytes: 0 %
Lymphocytes Relative: 23 %
Lymphs Abs: 1.9 K/uL (ref 0.7–4.0)
Monocytes Absolute: 0.8 K/uL (ref 0.1–1.0)
Monocytes Relative: 10 %
Neutro Abs: 5.4 K/uL (ref 1.7–7.7)
Neutrophils Relative %: 65 %

## 2023-02-06 LAB — URINALYSIS, ROUTINE W REFLEX MICROSCOPIC
Bilirubin Urine: NEGATIVE
Glucose, UA: NEGATIVE mg/dL
Hgb urine dipstick: NEGATIVE
Ketones, ur: NEGATIVE mg/dL
Leukocytes,Ua: NEGATIVE
Nitrite: NEGATIVE
Protein, ur: NEGATIVE mg/dL
Specific Gravity, Urine: 1.019 (ref 1.005–1.030)
pH: 5 (ref 5.0–8.0)

## 2023-02-06 LAB — PROTIME-INR
INR: 1.2 (ref 0.8–1.2)
Prothrombin Time: 15.5 s — ABNORMAL HIGH (ref 11.4–15.2)

## 2023-02-06 LAB — ETHANOL: Alcohol, Ethyl (B): <10 mg/dL

## 2023-02-06 LAB — APTT: aPTT: 31 s (ref 24–36)

## 2023-02-06 MED ORDER — SODIUM CHLORIDE 0.9% FLUSH
3.0000 mL | Freq: Once | INTRAVENOUS | Status: AC
  Administered 2023-02-06: 3 mL via INTRAVENOUS

## 2023-02-06 MED ORDER — LACTATED RINGERS IV BOLUS
1000.0000 mL | Freq: Once | INTRAVENOUS | Status: AC
  Administered 2023-02-06: 1000 mL via INTRAVENOUS

## 2023-02-06 NOTE — ED Triage Notes (Signed)
Patient to ED via POV fro stroke like symptoms. Daughter reporting increased confusion and left arm numbness. Pt states an increased in gait difficulty. LWK 2 days ago. Daughter also reports a delay in speech. Pt reporting soreness in neck.

## 2023-02-06 NOTE — ED Provider Notes (Signed)
[facility] Provider Note    Event Date/Time   First MD Initiated Contact with Patient 02/06/23 2002     (approximate)   History   Chief Complaint Stroke Symptoms   HPI  Kerri Mccullough is a 77 y.o. female with past medical history of hypertension, hyperlipidemia, diabetes, and CAD who presents to the ED for stroke symptoms.  Daughter reports that patient has had worsening stability when walking over the past couple of days but otherwise seem to be doing well.  Daughter did not see the patient yesterday, but picked her up today in order to take her shopping.  Patient was slow to move about the store and then had difficulty figuring out how to use her phone, which is atypical for her.  Patient also had difficulty buckling and unbuckling her seatbelt.  After returning home, patient reported that she had some numbness in her left arm, at which point daughter decided to bring her to the ED.  Patient describes the symptoms in her left arm as more as a soreness and tingling rather than numbness.  She does state that sensation is improving from earlier today, denies any weakness in the arm.  Patient denies any problems with her vision and daughter has not noticed any issues with her speech.  She has not had any fevers, cough, shortness of breath, nausea, vomiting, diarrhea, or dysuria.     Physical Exam   Triage Vital Signs: ED Triage Vitals  Encounter Vitals Group     BP 02/06/23 1802 (!) 175/71     Systolic BP Percentile --      Diastolic BP Percentile --      Pulse Rate 02/06/23 1802 74     Resp 02/06/23 1802 18     Temp 02/06/23 1802 98.9 F (37.2 C)     Temp Source 02/06/23 1802 Oral     SpO2 02/06/23 1802 99 %     Weight 02/06/23 1803 170 lb (77.1 kg)     Height 02/06/23 1803 5\' 4"  (1.626 m)     Head Circumference --      Peak Flow --      Pain Score 02/06/23 1803 8     Pain Loc --      Pain Education --      Exclude from Growth Chart --      Most recent vital signs: Vitals:   02/06/23 1802  BP: (!) 175/71  Pulse: 74  Resp: 18  Temp: 98.9 F (37.2 C)  SpO2: 99%    Constitutional: Alert and oriented. Eyes: Conjunctivae are normal. Head: Atraumatic. Nose: No congestion/rhinnorhea. Mouth/Throat: Mucous membranes are moist.  Neck: Supple with no meningismus. Cardiovascular: Normal rate, regular rhythm. Grossly normal heart sounds.  2+ radial pulses bilaterally. Respiratory: Normal respiratory effort.  No retractions. Lungs CTAB. Gastrointestinal: Soft and nontender. No distention. Musculoskeletal: No lower extremity tenderness nor edema.  Neurologic:  Normal speech and language. No gross focal neurologic deficits are appreciated.    ED Results / Procedures / Treatments   Labs (all labs ordered are listed, but only abnormal results are displayed) Labs Reviewed  PROTIME-INR - Abnormal; Notable for the following components:      Result Value   Prothrombin Time 15.5 (*)    All other components within normal limits  CBC - Abnormal; Notable for the following components:   RBC 3.69 (*)    Hemoglobin 9.9 (*)    HCT 32.1 (*)    RDW 16.8 (*)  All other components within normal limits  COMPREHENSIVE METABOLIC PANEL - Abnormal; Notable for the following components:   Sodium 131 (*)    Glucose, Bld 161 (*)    Creatinine, Ser 1.23 (*)    Calcium 8.5 (*)    AST 14 (*)    GFR, Estimated 46 (*)    All other components within normal limits  URINALYSIS, ROUTINE W REFLEX MICROSCOPIC - Abnormal; Notable for the following components:   Color, Urine YELLOW (*)    APPearance CLEAR (*)    All other components within normal limits  APTT  DIFFERENTIAL  ETHANOL     EKG  ED ECG REPORT I, Chesley Noon, the attending physician, personally viewed and interpreted this ECG.   Date: 02/06/2023  EKG Time: 18:04  Rate: 71  Rhythm: normal sinus rhythm  Axis: Normal  Intervals:none  ST&T Change: Inferior T wave  abnormality  RADIOLOGY CT head reviewed and interpreted by me with no hemorrhage or midline shift.  PROCEDURES:  Critical Care performed: No  Procedures   MEDICATIONS ORDERED IN ED: Medications  sodium chloride flush (NS) 0.9 % injection 3 mL (has no administration in time range)  lactated ringers bolus 1,000 mL (has no administration in time range)     IMPRESSION / MDM / ASSESSMENT AND PLAN / ED COURSE  I reviewed the triage vital signs and the nursing notes.                              77 y.o. female with past medical history of hypertension, hyperlipidemia, diabetes, and CAD who presents to the ED with unsteady gait as well as difficulty figuring out how to use her phone in the seatbelt today.  Patient's presentation is most consistent with acute presentation with potential threat to life or bodily function.  Differential diagnosis includes, but is not limited to, stroke, TIA, UTI, anemia, electrolyte abnormality, AKI.  Patient nontoxic-appearing and in no acute distress, vital signs are unremarkable.  She has no focal neurologic deficits on exam and description of symptoms from daughter does not seem focal, more of an issue with cognition and ambulation.  She did have some numbness in her left arm, but never reports any weakness and describes the numbness as more of a pain.  CT head is negative for acute process, labs are remarkable only for mild AKI with no significant anemia, leukocytosis, or electrolyte abnormality.  We will further assess with MRI of her brain to rule out acute stroke, urinalysis also pending at this time.  Urinalysis with no signs of infection.  Patient turned over to oncoming provider pending MRI results.  If these are unremarkable, patient would be appropriate for discharge home with outpatient follow-up given reassuring workup.  Patient and daughter in agreement with plan.      FINAL CLINICAL IMPRESSION(S) / ED DIAGNOSES   Final diagnoses:   Unsteady gait  Confusion     Rx / DC Orders   ED Discharge Orders     None        Note:  This document was prepared using Dragon voice recognition software and may include unintentional dictation errors.   Chesley Noon, MD 02/06/23 [phone]

## 2023-02-06 NOTE — Discharge Instructions (Addendum)
Take your medicines daily as directed by your doctor.  Return to the ER for worsening symptoms, persistent vomiting, difficulty breathing or other concerns. 

## 2023-02-07 DIAGNOSIS — R410 Disorientation, unspecified: Secondary | ICD-10-CM | POA: Diagnosis not present

## 2023-02-07 NOTE — ED Provider Notes (Signed)
-----------------------------------------   12:34 AM on 02/07/2023 -----------------------------------------   MRI brain interpreted per Dr. Aggie Hacker: No acute intracranial process. No evidence of acute or subacute infarct.   Updated patient and family member of negative MRI result.  She will follow-up closely with her PCP next week.  Strict return precautions given.  All verbalized understanding and agree with plan of care.   Irean Hong, MD 02/07/23 [phone]

## 2023-02-07 NOTE — ED Notes (Signed)
Pt and family updated on currently waiting on MRI results. Pt calm/cooperative, IVF infusing, on monitor and in no acute distress at this time.

## 2023-07-04 ENCOUNTER — Encounter (INDEPENDENT_AMBULATORY_CARE_PROVIDER_SITE_OTHER): Payer: Self-pay

## 2023-08-08 ENCOUNTER — Other Ambulatory Visit: Payer: Self-pay

## 2023-08-08 DIAGNOSIS — R32 Unspecified urinary incontinence: Secondary | ICD-10-CM

## 2023-08-08 MED ORDER — MIRABEGRON ER 50 MG PO TB24: 50.0000 mg | ORAL_TABLET | Freq: Every day | ORAL | 0 refills | Status: DC

## 2023-08-14 ENCOUNTER — Ambulatory Visit: Payer: Self-pay | Admitting: Urology

## 2023-08-16 ENCOUNTER — Encounter: Payer: Self-pay | Admitting: Urology

## 2023-08-16 ENCOUNTER — Ambulatory Visit: Admitting: Urology

## 2023-08-16 VITALS — BP 148/70 | HR 61 | Ht 64.0 in | Wt 160.0 lb

## 2023-08-16 DIAGNOSIS — Z2989 Encounter for other specified prophylactic measures: Secondary | ICD-10-CM | POA: Diagnosis not present

## 2023-08-16 DIAGNOSIS — R32 Unspecified urinary incontinence: Secondary | ICD-10-CM | POA: Diagnosis not present

## 2023-08-16 DIAGNOSIS — N952 Postmenopausal atrophic vaginitis: Secondary | ICD-10-CM | POA: Diagnosis not present

## 2023-08-16 LAB — MICROSCOPIC EXAMINATION: Epithelial Cells (non renal): >10 /HPF — AB (ref 0–10)

## 2023-08-16 LAB — URINALYSIS, COMPLETE
Bilirubin, UA: NEGATIVE
Glucose, UA: NEGATIVE
Leukocytes,UA: NEGATIVE
Nitrite, UA: NEGATIVE
Specific Gravity, UA: 1.03 (ref 1.005–1.030)
Urobilinogen, Ur: 1 mg/dL (ref 0.2–1.0)
pH, UA: 6 (ref 5.0–7.5)

## 2023-08-16 MED ORDER — MIRABEGRON ER 50 MG PO TB24: 50.0000 mg | ORAL_TABLET | Freq: Every day | ORAL | 0 refills | Status: DC

## 2023-08-16 MED ORDER — NITROFURANTOIN MACROCRYSTAL 100 MG PO CAPS: 100.0000 mg | ORAL_CAPSULE | Freq: Every day | ORAL | 11 refills | Status: AC

## 2023-08-16 MED ORDER — ESTRADIOL 0.1 MG/GM VA CREA: TOPICAL_CREAM | VAGINAL | 12 refills | Status: DC

## 2023-08-17 ENCOUNTER — Telehealth: Payer: Self-pay

## 2023-08-17 NOTE — Telephone Encounter (Signed)
 Pt lm on triage line that [facility] needs directions for estrace.   Advised pharmacist that a pea size amount is correct M/W/F.   Tried to contact pt. - Home number not accepting calls. Cell phone- no VM.   Sent My chart message.

## 2023-08-20 LAB — CULTURE, URINE COMPREHENSIVE

## 2023-08-21 ENCOUNTER — Ambulatory Visit: Payer: Self-pay | Admitting: Urology

## 2023-08-21 NOTE — Progress Notes (Signed)
 08/16/2023 3:16 PM   Kerri Mccullough Jan 10, 1946 [id number]  Referring provider: Autry Grayce LABOR, PA [address] [place],  [place] [address]  Urological history: 1.  Urinary incontinence - cysto (2024) NED - Myrbetriq  50 mg daily  2. Neurogenic bladder - UDS (2024) -  no detrusor contraction    Chief Complaint  Patient presents with   Urinary Tract Infection    urine odor, frequencey    HPI: Kerri Mccullough is a 78 y.o. woman who presents today for malodorous urine and frequency.  Previous records reviewed.   She states about a week or so ago, she stood up and she could feel urinary leakage and it had a musty odor to it.  She states that the odor is better now, but she has itchiness and irritation in the perineal area.  Patient denies any modifying or aggravating factors.  Patient denies any recent UTI's, gross hematuria, dysuria or suprapubic/flank pain.  Patient denies any fevers, chills, nausea or vomiting.    UA suspicious for infection w/ micro heme and bacteria  Serum creatinine (01/2023) 1.23, eGFR 46  PMH: Past Medical History:  Diagnosis Date   Actinic keratosis    Diabetes mellitus without complication (HCC)    GERD (gastroesophageal reflux disease)    Heart attack (HCC)    High cholesterol    Hypertension    Squamous cell carcinoma of skin    R cheek - treated at [facility]    Surgical History: Past Surgical History:  Procedure Laterality Date   ABDOMINAL HYSTERECTOMY     COLONOSCOPY WITH PROPOFOL  N/A 10/14/2019   Procedure: COLONOSCOPY WITH PROPOFOL ;  Surgeon: Maryruth Ole DASEN, MD;  Location: [facility] ENDOSCOPY;  Service: Endoscopy;  Laterality: N/A;   CORONARY ANGIOPLASTY WITH STENT PLACEMENT     ESOPHAGOGASTRODUODENOSCOPY (EGD) WITH PROPOFOL  N/A 10/14/2019   Procedure: ESOPHAGOGASTRODUODENOSCOPY (EGD) WITH PROPOFOL ;  Surgeon: Maryruth Ole DASEN, MD;  Location: [facility] ENDOSCOPY;  Service: Endoscopy;  Laterality: N/A;   LEFT HEART CATH AND CORONARY  ANGIOGRAPHY N/A 05/07/2018   Procedure: LEFT HEART CATH AND CORONARY ANGIOGRAPHY with possible PCI and stent;  Surgeon: Florencio Cara BIRCH, MD;  Location: [facility] INVASIVE CV LAB;  Service: Cardiovascular;  Laterality: N/A;    Home Medications:  Allergies as of 08/16/2023       Reactions   Ace Inhibitors Swelling   Rosiglitazone Other (See Comments)   Other Reaction: SOB, Pulmonary Edema Other Reaction: SOB, Pulmonary Edema Other Reaction: SOB, Pulmonary Edema   Aspirin  Swelling   When taken with a beta blocker    Golytely [peg [id number]-[id number]] Swelling        Medication List        Accurate as of August 16, 2023 11:59 PM. If you have any questions, ask your nurse or doctor.          amLODipine  5 MG tablet Commonly known as: NORVASC  Take 5 mg by mouth 2 (two) times daily.   aspirin  EC 81 MG tablet Take 81 mg by mouth daily.   atorvastatin  20 MG tablet Commonly known as: LIPITOR Take 20 mg by mouth daily.   carvedilol  6.25 MG tablet Commonly known as: COREG  Take 1 tablet (6.25 mg total) by mouth 2 (two) times daily with a meal.   citalopram  20 MG tablet Commonly known as: CELEXA  Take 20 mg by mouth daily.   estradiol  0.1 MG/GM vaginal cream Commonly known as: ESTRACE  VAGINAL Apply 0.5mg  (pea-sized amount)  just inside the vaginal introitus with a  finger-tip on Monday, Wednesday and Friday nights. Started by: [name]   glipiZIDE 5 MG 24 hr tablet Commonly known as: GLUCOTROL XL Take 5 mg by mouth daily.   losartan  50 MG tablet Commonly known as: COZAAR  Take 1 tablet by mouth daily.   metFORMIN  1000 MG tablet Commonly known as: GLUCOPHAGE  Take 1 tablet by mouth 2 (two) times daily.   mirabegron  ER 50 MG Tb24 tablet Commonly known as: MYRBETRIQ  Take 1 tablet (50 mg total) by mouth daily.   nitrofurantoin  100 MG capsule Commonly known as: Macrodantin  Take 1 capsule (100 mg total) by mouth daily.   omeprazole 40 MG capsule Commonly known  as: PRILOSEC Take 40 mg by mouth daily.   Ozempic (0.25 or 0.5 MG/DOSE) 2 MG/3ML Sopn Generic drug: Semaglutide(0.25 or 0.5MG /DOS) Inject into the skin.   pregabalin  25 MG capsule Commonly known as: LYRICA  Take 1 capsule (25 mg total) by mouth in the morning AND 2 capsules (50 mg total) at bedtime.        Allergies:  Allergies  Allergen Reactions   Ace Inhibitors Swelling   Rosiglitazone Other (See Comments)    Other Reaction: SOB, Pulmonary Edema Other Reaction: SOB, Pulmonary Edema Other Reaction: SOB, Pulmonary Edema    Aspirin  Swelling    When taken with a beta blocker    Golytely [Peg 3350-Kcl-Nabcb-Nacl-Nasulf] Swelling    Family History: Family History  Problem Relation Age of Onset   Diabetes Mother    Heart failure Mother    Lung cancer Father    Lung cancer Sister    Lung cancer Brother     Social History: See HPI for pertinent social history  ROS: Pertinent ROS in HPI  Physical Exam: BP (!) 148/70   Pulse 61   Ht 5' 4 (1.626 m)   Wt 160 lb (72.6 kg)   BMI 27.46 kg/m   Constitutional:  Well nourished. Alert and oriented, No acute distress. HEENT: [place] AT, moist mucus membranes.  Trachea midline Cardiovascular: No clubbing, cyanosis, or edema. Respiratory: Normal respiratory effort, no increased work of breathing. Neurologic: Grossly intact, no focal deficits, moving all 4 extremities. Psychiatric: Normal mood and affect.  Laboratory Data: See EPIC and HPI  I have reviewed the labs.   Pertinent Imaging: N/A  Assessment & Plan:    1. GSM - Explained that some of her symptoms may be the result of GSM and encouraged vaginal Estrace  cream application 3 nights weekly - Prescription sent to her pharmacy  2. Microscopic hematuria - May be the result of a urinary tract infection - Urine sent for culture - Continue Macrobid  100 mg daily for prophylaxis until culture results are available  - Will need to recheck on her urine in 1 month to  ensure microscopic hematuria resolves with her symptoms  3. Incontinence - Continue Myrbetriq  50 mg daily  Return in about 1 month (around 09/16/2023) for UA, PVR .  These notes generated with voice recognition software. I apologize for typographical errors.  [name] RIGGERS  [facility] [address]  [address] [place], [place] [address] [phone]

## 2023-08-23 ENCOUNTER — Other Ambulatory Visit: Payer: Self-pay

## 2023-08-23 DIAGNOSIS — N958 Other specified menopausal and perimenopausal disorders: Secondary | ICD-10-CM

## 2023-08-23 DIAGNOSIS — N952 Postmenopausal atrophic vaginitis: Secondary | ICD-10-CM

## 2023-08-23 MED ORDER — ESTRADIOL 0.1 MG/GM VA CREA: TOPICAL_CREAM | VAGINAL | 12 refills | Status: DC

## 2023-08-31 ENCOUNTER — Other Ambulatory Visit: Payer: Self-pay | Admitting: Urology

## 2023-08-31 DIAGNOSIS — R32 Unspecified urinary incontinence: Secondary | ICD-10-CM

## 2023-09-21 ENCOUNTER — Ambulatory Visit: Admitting: Urology

## 2023-09-21 ENCOUNTER — Telehealth (INDEPENDENT_AMBULATORY_CARE_PROVIDER_SITE_OTHER): Payer: Self-pay

## 2023-09-21 NOTE — Telephone Encounter (Signed)
 It looks like when we saw her in October she had normal blood flow and it was suggested that because her blood flow was normal, the pain is likely cause by neuropathy.  Neuropathy is not a vascular condition and we do not provide treatment for it.  This should be addressed by her PCP.  In addition, her nausea and vomiting should be addressed by her PCP as well.  If she wants we can check her blood flow again to see if it is still normal.  If it is, she will need to reach out to her PCP regarding ongoing treatment and evaluation for neuropathic issues.  However, I would suggest that she meet with her PCP first and determine if reevaluation by us  is necessary.

## 2023-09-21 NOTE — Telephone Encounter (Signed)
 Patient daughter reach out to the office stating that her mother is having severe pain, vomiting, and feeling fatigue. I did reach out to the patient and she informed that she is having pain from the left hip radiating down the leg with burning and stinging feeling. The patient is having feet numbness. The pt has been experiencing the pain several months but within the past month the pain has became more severe. The patient is feeling fatigue and been vomiting. Pt was advised to contact her PCP for the vomiting and fatigue feeling. Patient l/s 12/06/22 Please Advise

## 2023-09-21 NOTE — Telephone Encounter (Signed)
 Patient was notified with medical recommendations and verbalized that she will speak with her daughter to discuss the next step. Patient will reach out to the office if she prefers to move forward with scheduling appt. Patient will contact PCP for further evaluation.

## 2023-09-22 ENCOUNTER — Other Ambulatory Visit: Payer: Self-pay | Admitting: Urology

## 2023-09-22 DIAGNOSIS — R32 Unspecified urinary incontinence: Secondary | ICD-10-CM

## 2023-09-23 ENCOUNTER — Other Ambulatory Visit: Payer: Self-pay

## 2023-09-23 ENCOUNTER — Emergency Department

## 2023-09-23 ENCOUNTER — Emergency Department
Admission: EM | Admit: 2023-09-23 | Discharge: 2023-09-23 | Disposition: A | Discharge status: Home / Self Care | Source: Ambulatory Visit | Attending: Emergency Medicine | Admitting: Emergency Medicine

## 2023-09-23 DIAGNOSIS — R112 Nausea with vomiting, unspecified: Secondary | ICD-10-CM | POA: Insufficient documentation

## 2023-09-23 DIAGNOSIS — E860 Dehydration: Secondary | ICD-10-CM | POA: Insufficient documentation

## 2023-09-23 DIAGNOSIS — R059 Cough, unspecified: Secondary | ICD-10-CM | POA: Diagnosis not present

## 2023-09-23 DIAGNOSIS — R1011 Right upper quadrant pain: Secondary | ICD-10-CM | POA: Insufficient documentation

## 2023-09-23 DIAGNOSIS — I10 Essential (primary) hypertension: Secondary | ICD-10-CM | POA: Diagnosis not present

## 2023-09-23 DIAGNOSIS — K219 Gastro-esophageal reflux disease without esophagitis: Secondary | ICD-10-CM | POA: Diagnosis not present

## 2023-09-23 DIAGNOSIS — I2510 Atherosclerotic heart disease of native coronary artery without angina pectoris: Secondary | ICD-10-CM | POA: Insufficient documentation

## 2023-09-23 DIAGNOSIS — R509 Fever, unspecified: Secondary | ICD-10-CM | POA: Diagnosis not present

## 2023-09-23 LAB — CBC
HCT: 32.7 % — ABNORMAL LOW (ref 36.0–46.0)
Hemoglobin: 10.1 g/dL — ABNORMAL LOW (ref 12.0–15.0)
MCH: 27.2 pg (ref 26.0–34.0)
MCHC: 30.9 g/dL (ref 30.0–36.0)
MCV: 87.9 fL (ref 80.0–100.0)
Platelets: 207 K/uL (ref 150–400)
RBC: 3.72 MIL/uL — ABNORMAL LOW (ref 3.87–5.11)
RDW: 14.4 % (ref 11.5–15.5)
WBC: 5.3 K/uL (ref 4.0–10.5)
nRBC: 0 % (ref 0.0–0.2)

## 2023-09-23 LAB — COMPREHENSIVE METABOLIC PANEL WITH GFR
ALT: 12 U/L (ref 0–44)
AST: 17 U/L (ref 15–41)
Albumin: 4 g/dL (ref 3.5–5.0)
Alkaline Phosphatase: 55 U/L (ref 38–126)
Anion gap: 13 (ref 5–15)
BUN: 19 mg/dL (ref 8–23)
CO2: 24 mmol/L (ref 22–32)
Calcium: 9.2 mg/dL (ref 8.9–10.3)
Chloride: 101 mmol/L (ref 98–111)
Creatinine, Ser: 1.5 mg/dL — ABNORMAL HIGH (ref 0.44–1.00)
GFR, Estimated: 36 mL/min — ABNORMAL LOW (ref >60)
Glucose, Bld: 123 mg/dL — ABNORMAL HIGH (ref 70–99)
Potassium: 3.9 mmol/L (ref 3.5–5.1)
Sodium: 138 mmol/L (ref 135–145)
Total Bilirubin: 0.8 mg/dL (ref 0.0–1.2)
Total Protein: 7.7 g/dL (ref 6.5–8.1)

## 2023-09-23 LAB — RESP PANEL BY RT-PCR (RSV, FLU A&B, COVID)  RVPGX2
Influenza A by PCR: NEGATIVE
Influenza B by PCR: NEGATIVE
Resp Syncytial Virus by PCR: NEGATIVE
SARS Coronavirus 2 by RT PCR: NEGATIVE

## 2023-09-23 LAB — URINALYSIS, ROUTINE W REFLEX MICROSCOPIC
Bacteria, UA: NONE SEEN
Bilirubin Urine: NEGATIVE
Glucose, UA: NEGATIVE mg/dL
Hgb urine dipstick: NEGATIVE
Ketones, ur: NEGATIVE mg/dL
Nitrite: NEGATIVE
Protein, ur: NEGATIVE mg/dL
Specific Gravity, Urine: 1.014 (ref 1.005–1.030)
pH: 6 (ref 5.0–8.0)

## 2023-09-23 LAB — LIPASE, BLOOD: Lipase: 30 U/L (ref 11–51)

## 2023-09-23 LAB — MAGNESIUM: Magnesium: 1.9 mg/dL (ref 1.7–2.4)

## 2023-09-23 MED ORDER — METOCLOPRAMIDE HCL 10 MG PO TABS
10.0000 mg | ORAL_TABLET | Freq: Once | ORAL | Status: AC
  Administered 2023-09-23: 10 mg via ORAL
  Filled 2023-09-23: qty 1

## 2023-09-23 MED ORDER — IOHEXOL 300 MG/ML  SOLN
80.0000 mL | Freq: Once | INTRAMUSCULAR | Status: AC | PRN
  Administered 2023-09-23: 80 mL via INTRAVENOUS

## 2023-09-23 MED ORDER — SODIUM CHLORIDE 0.9 % IV BOLUS
1000.0000 mL | Freq: Once | INTRAVENOUS | Status: AC
  Administered 2023-09-23: 1000 mL via INTRAVENOUS

## 2023-09-23 MED ORDER — METOCLOPRAMIDE HCL 10 MG PO TABS: 10.0000 mg | ORAL_TABLET | Freq: Three times a day (TID) | ORAL | 0 refills | Status: DC | PRN

## 2023-09-23 MED ORDER — ONDANSETRON HCL 4 MG/2ML IJ SOLN
4.0000 mg | Freq: Once | INTRAMUSCULAR | Status: AC
  Administered 2023-09-23: 4 mg via INTRAVENOUS
  Filled 2023-09-23: qty 2

## 2023-09-23 NOTE — ED Triage Notes (Signed)
 First RN Note:  Pt, from [facility], c/o RUQ pain radiating into back and emesis x4 days.

## 2023-09-23 NOTE — ED Notes (Signed)
 RN to bedside to introduce self to pt. Pt needed to walk to bathroom. Yellow bracelet placed due to not already in place. Pt is very unsteady on her feet. Pt advised this is normal for her. Pt is caox4, in no acute distress and ambulatory. Daughter gone to car to get new brief.

## 2023-09-23 NOTE — ED Provider Notes (Signed)
  Physical Exam  BP 137/76   Pulse 61   Temp 98 F (36.7 C) (Oral)   Resp 18   Ht 5' 4 (1.626 m)   Wt 70.3 kg   SpO2 97%   BMI 26.61 kg/m   Physical Exam  Procedures  Procedures  ED Course / MDM   Clinical Course as of 09/23/23 1725  Sat Sep 23, 2023  1501 S/o: 2F p/w n/v, not tolerating po x3 days - ?low grade temp a few days ago, not recurrent - labs reassuring - CTAP w/ no acute pathology  PO trial, anticipate DC home w/ reglan . Hold ozempic.  If fails p.o. trial, consider admission. [MM]  1724 Patient reevaluated, has tolerated p.o. without difficulty.  Patient and daughter are comfortable with her going home.  Will continue with plan to use Reglan  and hold on Ozempic until she is reevaluated by her primary care provider.  ED return precautions in place.  Patient family agree with plan.  Well-appearing at time of discharge with stable vital signs.  Labs unremarkable.  No acute findings on CT abdomen pelvis, known pancreatic mass, prior provider discussed with patient and she was already aware. [MM]    Clinical Course User Index [MM] Clarine Ozell LABOR, MD   Medical Decision Making Amount and/or Complexity of Data Reviewed Labs: ordered. Radiology: ordered.  Risk Prescription drug management.          Clarine Ozell LABOR, MD 09/23/23 [phone]

## 2023-09-23 NOTE — ED Notes (Signed)
 This EDT gave pt crackers and ginger ale to eat and drink.

## 2023-09-23 NOTE — ED Provider Notes (Addendum)
 [facility] Provider Note    Event Date/Time   First MD Initiated Contact with Patient 09/23/23 1002     (approximate)  History   Chief Complaint: Emesis and Abdominal Pain  HPI  Kerri Mccullough is a 78 y.o. female with a past medical history of gastric reflux, hypertension, hyperlipidemia, CAD, presents to the emergency department for nausea and vomiting and generalized fatigue.  According to the patient and daughter since Wednesday (3 days ago) patient has been experiencing fatigue nausea and vomiting.  Wednesday night patient had a fever as well.  Patient has been eating a very bland diet but continues to be nauseated with occasional episodes of vomiting.  Last bowel movement was Wednesday.  No diarrhea.  Patient states slight cough but states that is rather chronic for her.  Patient states occasional abdominal discomfort in the right upper quadrant.  Patient still has her gallbladder.  Physical Exam   Triage Vital Signs: ED Triage Vitals  Encounter Vitals Group     BP 09/23/23 0954 (!) 133/57     Girls Systolic BP Percentile --      Girls Diastolic BP Percentile --      Boys Systolic BP Percentile --      Boys Diastolic BP Percentile --      Pulse Rate 09/23/23 0954 (!) 51     Resp 09/23/23 0952 18     Temp 09/23/23 0952 99 F (37.2 C)     Temp src --      SpO2 09/23/23 0954 97 %     Weight 09/23/23 0953 155 lb (70.3 kg)     Height 09/23/23 0953 5' 4 (1.626 m)     Head Circumference --      Peak Flow --      Pain Score 09/23/23 0952 6     Pain Loc --      Pain Education --      Exclude from Growth Chart --     Most recent vital signs: Vitals:   09/23/23 0954 09/23/23 1000  BP: (!) 133/57 (!) 142/53  Pulse: (!) 51 (!) 50  Resp:    Temp:    SpO2: 97% 99%    General: Awake, no distress.  CV:  Good peripheral perfusion.  Regular rate and rhythm  Resp:  Normal effort.  Equal breath sounds bilaterally.  Abd:  No distention.  Soft, mild  epigastric tenderness otherwise benign abdomen.  ED Results / Procedures / Treatments   EKG  EKG viewed and interpreted by myself shows a sinus bradycardia 51 bpm with a narrow QRS, normal axis, normal intervals, no concerning ST changes.  RADIOLOGY  I have reviewed interpret the CT images.  No obvious abnormality on my evaluation. Radiology has read the CT scan is negative for acute abnormality.  Discussed the pancreatic lesion with the patient.  MEDICATIONS ORDERED IN ED: Medications  sodium chloride  0.9 % bolus 1,000 mL (has no administration in time range)  ondansetron  (ZOFRAN ) injection 4 mg (has no administration in time range)     IMPRESSION / MDM / ASSESSMENT AND PLAN / ED COURSE  I reviewed the triage vital signs and the nursing notes.  Patient's presentation is most consistent with acute presentation with potential threat to life or bodily function.  Patient presents to the emergency department for nausea and vomiting ongoing since Wednesday.  Patient had low-grade fever on Wednesday as well.  Intermittent right upper quadrant abdominal discomfort.  Differential would include  gallbladder pathology, SBO or partial SBO, ileus, gastritis or gastroenteritis, viral pathology.  We will check labs, IV hydrate treat nausea with Zofran .  Will obtain a COVID swab as a precaution.  Will continue to closely monitor while awaiting results.  CT scan does not appear to show any acute abnormality.  Discussed the pancreatic lesion with the patient which she is already aware of.  CBC is normal.  Chemistry shows slight renal insufficiency otherwise reassuring.  Lipase is normal LFTs are normal.  Respiratory panel is negative.  Magnesium is normal.  Given the patient's reassuring workup we will attempt to p.o. trial as long as the patient is able to p.o. trial anticipate discharge home with nausea medication and fluids and follow-up with her doctor.  Patient states she is taking Ozempic but has been  on this medication for approximately 1 year.  This could be contributing to her symptoms.  Discussed with the patient to skip her dose on Sunday and follow-up with her doctor before restarting.  FINAL CLINICAL IMPRESSION(S) / ED DIAGNOSES   Nausea vomiting Dehydration  Note:  This document was prepared using Dragon voice recognition software and may include unintentional dictation errors.   Dorothyann Drivers, MD 09/23/23 1448    Dorothyann Drivers, MD 09/23/23 [phone]

## 2023-09-23 NOTE — ED Triage Notes (Signed)
 Pt to ED from [facility] for circumferential abd pain, emesis since Wednesday.

## 2023-09-23 NOTE — Discharge Instructions (Addendum)
 As we discussed please do not take your Ozempic injection Sunday.  Please follow-up with your doctor regarding this.  Please use your prescribed Reglan  approximately 30 to 40 minutes prior to attempting to eat or drink.  Return to the emergency department for any symptom concerning to yourself.

## 2023-10-05 NOTE — Progress Notes (Signed)
 10/06/2023 9:33 PM   Kerri Mccullough [birth year] [id number]  Referring provider: Autry Grayce LABOR, PA [address] [place],  [place] [address]  Urological history: 1.  Urinary incontinence - cysto (2024) NED - Myrbetriq  50 mg daily  2. Neurogenic bladder - UDS (2024) -  no detrusor contraction    Chief Complaint  Patient presents with   Follow-up   Over Active Bladder    HPI: Kerri Mccullough is a 78 y.o. woman who presents today for one month follow up.    Previous records reviewed.   At her visit on 08/16/2023, she states about a week or so ago, she stood up and she could feel urinary leakage and it had a musty odor to it.  She states that the odor is better now, but she has itchiness and irritation in the perineal area.  Patient denies any modifying or aggravating factors.  Patient denies any recent UTI's, gross hematuria, dysuria or suprapubic/flank pain.  Patient denies any fevers, chills, nausea or vomiting.   UA suspicious for infection w/ micro heme and bacteria.  Urine culture grew out mixed urogenital flora.  Serum creatinine (01/2023) 1.23, eGFR 46.  We encouraged her to use vaginal estrogen cream 3 nights weekly, advised her to return in 1 month to recheck her urine as it had microscopic hematuria and to continue the Myrbetriq  50 mg daily.  In the interim she was seen in the ED on September 23, 2023 for nausea and vomiting.  A contrast CT was performed for further evaluation with no worrisome GU findings.  Former smoker.   She was having pedal edema prior to her ED visit and it has since resolved.  She feels it was due to the Myrbetriq .   She has since stopped the medication.  She has not seen any worsening in her symptoms.  Patient denies any modifying or aggravating factors.  Patient denies any recent UTI's, gross hematuria, dysuria or suprapubic/flank pain.  Patient denies any fevers, chills, nausea or vomiting.    She has been applying the vaginal estrogen cream.    She has some vaginal itching.    UA positive for 11-30 WBCs and moderate bacteria  PVR 2 mL    PMH: Past Medical History:  Diagnosis Date   Actinic keratosis    Diabetes mellitus without complication (HCC)    GERD (gastroesophageal reflux disease)    Heart attack (HCC)    High cholesterol    Hypertension    Squamous cell carcinoma of skin    R cheek - treated at [facility]    Surgical History: Past Surgical History:  Procedure Laterality Date   ABDOMINAL HYSTERECTOMY     COLONOSCOPY WITH PROPOFOL  N/A 10/14/2019   Procedure: COLONOSCOPY WITH PROPOFOL ;  Surgeon: Maryruth Ole DASEN, MD;  Location: [facility] ENDOSCOPY;  Service: Endoscopy;  Laterality: N/A;   CORONARY ANGIOPLASTY WITH STENT PLACEMENT     ESOPHAGOGASTRODUODENOSCOPY (EGD) WITH PROPOFOL  N/A 10/14/2019   Procedure: ESOPHAGOGASTRODUODENOSCOPY (EGD) WITH PROPOFOL ;  Surgeon: Maryruth Ole DASEN, MD;  Location: [facility] ENDOSCOPY;  Service: Endoscopy;  Laterality: N/A;   LEFT HEART CATH AND CORONARY ANGIOGRAPHY N/A 05/07/2018   Procedure: LEFT HEART CATH AND CORONARY ANGIOGRAPHY with possible PCI and stent;  Surgeon: Florencio Kerri BIRCH, MD;  Location: [facility] INVASIVE CV LAB;  Service: Cardiovascular;  Laterality: N/A;    Home Medications:  Allergies as of 10/06/2023       Reactions   Ace Inhibitors Swelling   Rosiglitazone Other (See Comments)  Other Reaction: SOB, Pulmonary Edema Other Reaction: SOB, Pulmonary Edema Other Reaction: SOB, Pulmonary Edema   Aspirin  Swelling   When taken with a beta blocker    Golytely [peg 3350-[id number]-nacl-nasulf] Swelling        Medication List        Accurate as of October 06, 2023 11:59 PM. If you have any questions, ask your nurse or doctor.          STOP taking these medications    glipiZIDE 5 MG 24 hr tablet Commonly known as: GLUCOTROL XL   pregabalin  25 MG capsule Commonly known as: LYRICA        TAKE these medications    amLODipine  5 MG tablet Commonly known as:  NORVASC  Take 5 mg by mouth 2 (two) times daily.   aspirin  EC 81 MG tablet Take 81 mg by mouth daily.   atorvastatin  20 MG tablet Commonly known as: LIPITOR Take 20 mg by mouth daily.   carvedilol  6.25 MG tablet Commonly known as: COREG  Take 1 tablet (6.25 mg total) by mouth 2 (two) times daily with a meal.   citalopram  20 MG tablet Commonly known as: CELEXA  Take 20 mg by mouth daily.   estradiol  0.1 MG/GM vaginal cream Commonly known as: ESTRACE  VAGINAL Estrogen Cream Instruction Discard applicator Apply pea sized amount to tip of finger to urethra before bed. Wash hands well after application. Use Monday, Wednesday and Friday   losartan  50 MG tablet Commonly known as: COZAAR  Take 1 tablet by mouth daily.   metFORMIN  1000 MG tablet Commonly known as: GLUCOPHAGE  Take 1 tablet by mouth 2 (two) times daily.   metoCLOPramide  10 MG tablet Commonly known as: REGLAN  Take 1 tablet (10 mg total) by mouth every 8 (eight) hours as needed for nausea.   Myrbetriq  50 MG Tb24 tablet Generic drug: mirabegron  ER TAKE 1 TABLET BY MOUTH DAILY   nitrofurantoin  100 MG capsule Commonly known as: Macrodantin  Take 1 capsule (100 mg total) by mouth daily.   omeprazole 40 MG capsule Commonly known as: PRILOSEC Take 40 mg by mouth daily.   Ozempic (0.25 or 0.5 MG/DOSE) 2 MG/3ML Sopn Generic drug: Semaglutide(0.25 or 0.5MG /DOS) Inject into the skin.        Allergies:  Allergies  Allergen Reactions   Ace Inhibitors Swelling   Rosiglitazone Other (See Comments)    Other Reaction: SOB, Pulmonary Edema Other Reaction: SOB, Pulmonary Edema Other Reaction: SOB, Pulmonary Edema    Aspirin  Swelling    When taken with a beta blocker    Golytely [Peg 3350-Kcl-Nabcb-Nacl-Nasulf] Swelling    Family History: Family History  Problem Relation Age of Onset   Diabetes Mother    Heart failure Mother    Lung cancer Father    Lung cancer Sister    Lung cancer Brother     Social  History: See HPI for pertinent social history  ROS: Pertinent ROS in HPI  Physical Exam: BP 129/67 (BP Location: Left Arm, Patient Position: Sitting, Cuff Size: Normal)   Pulse (!) 57   Ht 5' 4 (1.626 m)   Wt 160 lb (72.6 kg)   SpO2 98%   BMI 27.46 kg/m   Constitutional:  Well nourished. Alert and oriented, No acute distress. HEENT:  AT, moist mucus membranes.  Trachea midline Cardiovascular: No clubbing, cyanosis, or edema. Respiratory: Normal respiratory effort, no increased work of breathing. GU: No CVA tenderness.  No bladder fullness or masses.  Recession of labia minora, dry, pale vulvar vaginal mucosa and loss  of mucosal ridges and folds.  Normal urethral meatus, no lesions, no prolapse, no discharge.   No urethral masses, tenderness and/or tenderness. No bladder fullness, tenderness or masses. Pale vaginal mucosa, fair estrogen effect, no discharge, no lesions, fair pelvic support, grade I cystocele and no rectocele noted.  No cervical motion tenderness.  She did leak with Valsalva.  Anus and perineum are without rashes or lesions.    Neurologic: Grossly intact, no focal deficits, moving all 4 extremities. Psychiatric: Normal mood and affect.    Laboratory Data: See EPIC and HPI  I have reviewed the labs.   Pertinent Imaging: CLINICAL DATA:  Abdominal pain, acute, nonlocalized   EXAM: CT ABDOMEN AND PELVIS WITH CONTRAST   TECHNIQUE: Multidetector CT imaging of the abdomen and pelvis was performed using the standard protocol following bolus administration of intravenous contrast.   RADIATION DOSE REDUCTION: This exam was performed according to the departmental dose-optimization program which includes automated exposure control, adjustment of the mA and/or kV according to patient size and/or use of iterative reconstruction technique.   CONTRAST:  80mL OMNIPAQUE  IOHEXOL  300 MG/ML  SOLN   COMPARISON:  October 22, 2021, July 18, 2021   FINDINGS: Lower chest: Mild  intralobular septal thickening within the lung bases.No focal airspace consolidation or pleural effusion. Unchanged subpleural nodules in the left lower lobe (axial 12-15) and right lower lobe (axial 17), likely benign.   Hepatobiliary: No mass.Focal fatty infiltration along the falciform ligament.No radiopaque stones or wall thickening of the gallbladder. No intrahepatic or extrahepatic biliary ductal dilation. The portal veins are patent.   Pancreas: Diffuse fatty atrophy of the pancreatic parenchyma. No mass or ductal dilation. Small hypodense lesion in the pancreatic head, unchanged no peripancreatic inflammation or fluid collection.   Spleen: Normal size. No mass.   Adrenals/Urinary Tract: No adrenal masses. 8 mm hypodensity in the lower pole of the left kidney (sagittal 98), unchanged, likely a small proteinaceous or hemorrhagic cyst. No nephrolithiasis or hydronephrosis. The urinary bladder is distended without focal abnormality.   Stomach/Bowel: Moderate-sized sliding-type hiatal hernia. The stomach is decompressed without focal abnormality. No small bowel wall thickening or inflammation. No small bowel obstruction.The appendix was not visualized. No right lower quadrant or pericecal inflammatory changes to suggest acute appendicitis.   Vascular/Lymphatic: No aortic aneurysm. Diffuse aortoiliac atherosclerosis. No intraabdominal or pelvic lymphadenopathy.   Reproductive: No prostatomegaly.No free pelvic fluid.   Other: No pneumoperitoneum, ascites, or mesenteric inflammation.   Musculoskeletal: No acute fracture or destructive lesion. Diffuse osteopenia. Chronic severe compression fractures of L1 and L2. Retropulsion of the L2 vertebral body causing moderate spinal canal narrowing. Thoracic DISH.   IMPRESSION: 1. No acute intra-abdominal or pelvic abnormality. 2. Mild intralobular septal thickening within the lung bases, which may reflect changes of mild  interstitial edema. 3. Moderate-sized sliding-type hiatal hernia. No changes of vascular compromise. 4. Redemonstrated hypodense lesion in the pancreatic head, unchanged, consistent with an intraductal papillary mucinous neoplasm (IPMN).   Aortic Atherosclerosis (ICD10-I70.0).     Electronically Signed   By: Rogelia Myers M.D.   On: 09/23/2023 14:33    I have independently reviewed the films.  See HPI.   There is no prostate.     Assessment & Plan:    1. GSM - Continue vaginal estrogen cream 3 nights weekly  2. Microscopic hematuria - former smoker  - UA negative for micro heme   3. Incontinence - discontinue Myrbetriq  50 mg daily - she will follow up in three months to  recheck her symptoms   Return in about 3 months (around 01/06/2024) for symptom recheck .  These notes generated with voice recognition software. I apologize for typographical errors.  Kerri Mccullough  [facility] [address]  [address] [place], [place] [address] [phone]

## 2023-10-06 ENCOUNTER — Ambulatory Visit (INDEPENDENT_AMBULATORY_CARE_PROVIDER_SITE_OTHER): Admitting: Urology

## 2023-10-06 VITALS — BP 129/67 | HR 57 | Ht 64.0 in | Wt 160.0 lb

## 2023-10-06 DIAGNOSIS — R3129 Other microscopic hematuria: Secondary | ICD-10-CM | POA: Diagnosis not present

## 2023-10-06 DIAGNOSIS — N958 Other specified menopausal and perimenopausal disorders: Secondary | ICD-10-CM | POA: Diagnosis not present

## 2023-10-06 DIAGNOSIS — R32 Unspecified urinary incontinence: Secondary | ICD-10-CM | POA: Diagnosis not present

## 2023-10-06 DIAGNOSIS — N952 Postmenopausal atrophic vaginitis: Secondary | ICD-10-CM

## 2023-10-06 LAB — BLADDER SCAN AMB NON-IMAGING

## 2023-10-06 LAB — URINALYSIS, COMPLETE
Bilirubin, UA: NEGATIVE
Glucose, UA: NEGATIVE
Nitrite, UA: NEGATIVE
RBC, UA: NEGATIVE
Specific Gravity, UA: 1.01 (ref 1.005–1.030)
Urobilinogen, Ur: 1 mg/dL (ref 0.2–1.0)
pH, UA: 6.5 (ref 5.0–7.5)

## 2023-10-06 LAB — MICROSCOPIC EXAMINATION

## 2023-10-06 MED ORDER — ESTRADIOL 0.1 MG/GM VA CREA: TOPICAL_CREAM | VAGINAL | 12 refills | Status: AC

## 2023-10-08 ENCOUNTER — Encounter: Payer: Self-pay | Admitting: Urology

## 2024-01-05 ENCOUNTER — Ambulatory Visit: Admitting: Urology

## 2024-02-08 ENCOUNTER — Inpatient Hospital Stay
Admission: EM | Admit: 2024-02-08 | Discharge: 2024-02-11 | Disposition: A | Discharge status: Home Health | Attending: Internal Medicine | Admitting: Internal Medicine

## 2024-02-08 ENCOUNTER — Emergency Department

## 2024-02-08 ENCOUNTER — Other Ambulatory Visit: Payer: Self-pay

## 2024-02-08 DIAGNOSIS — Z7985 Long-term (current) use of injectable non-insulin antidiabetic drugs: Secondary | ICD-10-CM | POA: Diagnosis not present

## 2024-02-08 DIAGNOSIS — E7800 Pure hypercholesterolemia, unspecified: Secondary | ICD-10-CM | POA: Diagnosis present

## 2024-02-08 DIAGNOSIS — Z833 Family history of diabetes mellitus: Secondary | ICD-10-CM | POA: Diagnosis not present

## 2024-02-08 DIAGNOSIS — I130 Hypertensive heart and chronic kidney disease with heart failure and stage 1 through stage 4 chronic kidney disease, or unspecified chronic kidney disease: Secondary | ICD-10-CM | POA: Diagnosis present

## 2024-02-08 DIAGNOSIS — I2489 Other forms of acute ischemic heart disease: Secondary | ICD-10-CM | POA: Diagnosis present

## 2024-02-08 DIAGNOSIS — N1832 Chronic kidney disease, stage 3b: Secondary | ICD-10-CM | POA: Diagnosis present

## 2024-02-08 DIAGNOSIS — I5A Non-ischemic myocardial injury (non-traumatic): Secondary | ICD-10-CM | POA: Diagnosis present

## 2024-02-08 DIAGNOSIS — K219 Gastro-esophageal reflux disease without esophagitis: Secondary | ICD-10-CM | POA: Diagnosis present

## 2024-02-08 DIAGNOSIS — D509 Iron deficiency anemia, unspecified: Secondary | ICD-10-CM | POA: Diagnosis present

## 2024-02-08 DIAGNOSIS — D649 Anemia, unspecified: Secondary | ICD-10-CM | POA: Diagnosis present

## 2024-02-08 DIAGNOSIS — Z85828 Personal history of other malignant neoplasm of skin: Secondary | ICD-10-CM | POA: Diagnosis not present

## 2024-02-08 DIAGNOSIS — Z1152 Encounter for screening for COVID-19: Secondary | ICD-10-CM

## 2024-02-08 DIAGNOSIS — Z66 Do not resuscitate: Secondary | ICD-10-CM | POA: Diagnosis present

## 2024-02-08 DIAGNOSIS — I509 Heart failure, unspecified: Secondary | ICD-10-CM

## 2024-02-08 DIAGNOSIS — M4856XS Collapsed vertebra, not elsewhere classified, lumbar region, sequela of fracture: Secondary | ICD-10-CM | POA: Diagnosis present

## 2024-02-08 DIAGNOSIS — Z955 Presence of coronary angioplasty implant and graft: Secondary | ICD-10-CM

## 2024-02-08 DIAGNOSIS — J9601 Acute respiratory failure with hypoxia: Secondary | ICD-10-CM | POA: Diagnosis present

## 2024-02-08 DIAGNOSIS — I5033 Acute on chronic diastolic (congestive) heart failure: Secondary | ICD-10-CM | POA: Diagnosis present

## 2024-02-08 DIAGNOSIS — Z801 Family history of malignant neoplasm of trachea, bronchus and lung: Secondary | ICD-10-CM

## 2024-02-08 DIAGNOSIS — I252 Old myocardial infarction: Secondary | ICD-10-CM

## 2024-02-08 DIAGNOSIS — F32A Depression, unspecified: Secondary | ICD-10-CM | POA: Diagnosis present

## 2024-02-08 DIAGNOSIS — E785 Hyperlipidemia, unspecified: Secondary | ICD-10-CM | POA: Diagnosis present

## 2024-02-08 DIAGNOSIS — Z7984 Long term (current) use of oral hypoglycemic drugs: Secondary | ICD-10-CM | POA: Diagnosis not present

## 2024-02-08 DIAGNOSIS — I2510 Atherosclerotic heart disease of native coronary artery without angina pectoris: Secondary | ICD-10-CM | POA: Diagnosis present

## 2024-02-08 DIAGNOSIS — Z6830 Body mass index (BMI) 30.0-30.9, adult: Secondary | ICD-10-CM

## 2024-02-08 DIAGNOSIS — R0600 Dyspnea, unspecified: Secondary | ICD-10-CM

## 2024-02-08 DIAGNOSIS — Z90710 Acquired absence of both cervix and uterus: Secondary | ICD-10-CM

## 2024-02-08 DIAGNOSIS — Z87891 Personal history of nicotine dependence: Secondary | ICD-10-CM | POA: Diagnosis not present

## 2024-02-08 DIAGNOSIS — Z886 Allergy status to analgesic agent status: Secondary | ICD-10-CM

## 2024-02-08 DIAGNOSIS — I10 Essential (primary) hypertension: Secondary | ICD-10-CM | POA: Diagnosis present

## 2024-02-08 DIAGNOSIS — D631 Anemia in chronic kidney disease: Secondary | ICD-10-CM | POA: Diagnosis present

## 2024-02-08 DIAGNOSIS — E66811 Obesity, class 1: Secondary | ICD-10-CM | POA: Diagnosis present

## 2024-02-08 DIAGNOSIS — Z79899 Other long term (current) drug therapy: Secondary | ICD-10-CM

## 2024-02-08 DIAGNOSIS — E1122 Type 2 diabetes mellitus with diabetic chronic kidney disease: Secondary | ICD-10-CM | POA: Diagnosis present

## 2024-02-08 DIAGNOSIS — Z8249 Family history of ischemic heart disease and other diseases of the circulatory system: Secondary | ICD-10-CM

## 2024-02-08 DIAGNOSIS — Z7982 Long term (current) use of aspirin: Secondary | ICD-10-CM

## 2024-02-08 DIAGNOSIS — J101 Influenza due to other identified influenza virus with other respiratory manifestations: Principal | ICD-10-CM | POA: Diagnosis present

## 2024-02-08 DIAGNOSIS — E669 Obesity, unspecified: Secondary | ICD-10-CM | POA: Diagnosis not present

## 2024-02-08 DIAGNOSIS — R0902 Hypoxemia: Principal | ICD-10-CM

## 2024-02-08 DIAGNOSIS — E1129 Type 2 diabetes mellitus with other diabetic kidney complication: Secondary | ICD-10-CM | POA: Diagnosis present

## 2024-02-08 DIAGNOSIS — Z888 Allergy status to other drugs, medicaments and biological substances status: Secondary | ICD-10-CM

## 2024-02-08 DIAGNOSIS — R600 Localized edema: Secondary | ICD-10-CM | POA: Diagnosis not present

## 2024-02-08 HISTORY — DX: Atherosclerotic heart disease of native coronary artery without angina pectoris: I25.10

## 2024-02-08 LAB — BLOOD GAS, VENOUS
Acid-base deficit: 0.5 mmol/L (ref 0.0–2.0)
Bicarbonate: 24.2 mmol/L (ref 20.0–28.0)
O2 Saturation: 70.9 %
Patient temperature: 37
pCO2, Ven: 39 mmHg — ABNORMAL LOW (ref 44–60)
pH, Ven: 7.4 (ref 7.25–7.43)
pO2, Ven: 42 mmHg (ref 32–45)

## 2024-02-08 LAB — BASIC METABOLIC PANEL WITH GFR
Anion gap: 14 (ref 5–15)
BUN: 16 mg/dL (ref 8–23)
CO2: 22 mmol/L (ref 22–32)
Calcium: 8.7 mg/dL — ABNORMAL LOW (ref 8.9–10.3)
Chloride: 100 mmol/L (ref 98–111)
Creatinine, Ser: 1.37 mg/dL — ABNORMAL HIGH (ref 0.44–1.00)
GFR, Estimated: 40 mL/min — ABNORMAL LOW
Glucose, Bld: 164 mg/dL — ABNORMAL HIGH (ref 70–99)
Potassium: 4.3 mmol/L (ref 3.5–5.1)
Sodium: 135 mmol/L (ref 135–145)

## 2024-02-08 LAB — TROPONIN T, HIGH SENSITIVITY
Troponin T High Sensitivity: 21 ng/L — ABNORMAL HIGH (ref 0–19)
Troponin T High Sensitivity: 20 ng/L — ABNORMAL HIGH (ref 0–19)

## 2024-02-08 LAB — TYPE AND SCREEN

## 2024-02-08 LAB — CBC
HCT: 27.6 % — ABNORMAL LOW (ref 36.0–46.0)
Hemoglobin: 8.3 g/dL — ABNORMAL LOW (ref 12.0–15.0)
MCH: 25 pg — ABNORMAL LOW (ref 26.0–34.0)
MCHC: 30.1 g/dL (ref 30.0–36.0)
MCV: 83.1 fL (ref 80.0–100.0)
Platelets: 238 K/uL (ref 150–400)
RBC: 3.32 MIL/uL — ABNORMAL LOW (ref 3.87–5.11)
RDW: 16.3 % — ABNORMAL HIGH (ref 11.5–15.5)
WBC: 8.4 K/uL (ref 4.0–10.5)
nRBC: 0 % (ref 0.0–0.2)

## 2024-02-08 LAB — PRO BRAIN NATRIURETIC PEPTIDE: Pro Brain Natriuretic Peptide: 1115 pg/mL — ABNORMAL HIGH

## 2024-02-08 LAB — RESP PANEL BY RT-PCR (RSV, FLU A&B, COVID)  RVPGX2
Influenza A by PCR: POSITIVE — AB
Influenza B by PCR: NEGATIVE
Resp Syncytial Virus by PCR: NEGATIVE
SARS Coronavirus 2 by RT PCR: NEGATIVE

## 2024-02-08 LAB — CBG MONITORING, ED: Glucose-Capillary: 170 mg/dL — ABNORMAL HIGH (ref 70–99)

## 2024-02-08 LAB — LACTIC ACID, PLASMA: Lactic Acid, Venous: 1.9 mmol/L (ref 0.5–1.9)

## 2024-02-08 MED ORDER — OSELTAMIVIR PHOSPHATE 30 MG PO CAPS
30.0000 mg | ORAL_CAPSULE | Freq: Two times a day (BID) | ORAL | Status: DC
  Administered 2024-02-08 – 2024-02-11 (×6): 30 mg via ORAL
  Filled 2024-02-08 (×6): qty 1

## 2024-02-08 MED ORDER — ACETAMINOPHEN 500 MG PO TABS
1000.0000 mg | ORAL_TABLET | Freq: Once | ORAL | Status: AC
  Administered 2024-02-08: 1000 mg via ORAL
  Filled 2024-02-08: qty 2

## 2024-02-08 MED ORDER — ALBUTEROL SULFATE HFA 108 (90 BASE) MCG/ACT IN AERS: 2.0000 | INHALATION_SPRAY | RESPIRATORY_TRACT | Status: DC | PRN

## 2024-02-08 MED ORDER — ACETAMINOPHEN 325 MG PO TABS
650.0000 mg | ORAL_TABLET | Freq: Four times a day (QID) | ORAL | Status: DC | PRN
  Administered 2024-02-09: 650 mg via ORAL
  Filled 2024-02-08 (×2): qty 2

## 2024-02-08 MED ORDER — METHYLPREDNISOLONE SODIUM SUCC 125 MG IJ SOLR
125.0000 mg | Freq: Once | INTRAMUSCULAR | Status: AC
  Administered 2024-02-08: 125 mg via INTRAVENOUS
  Filled 2024-02-08: qty 2

## 2024-02-08 MED ORDER — IPRATROPIUM-ALBUTEROL 0.5-2.5 (3) MG/3ML IN SOLN
3.0000 mL | RESPIRATORY_TRACT | Status: DC
  Administered 2024-02-09 (×3): 3 mL via RESPIRATORY_TRACT
  Filled 2024-02-08 (×4): qty 3

## 2024-02-08 MED ORDER — HYDRALAZINE HCL 20 MG/ML IJ SOLN: 5.0000 mg | INTRAMUSCULAR | Status: DC | PRN

## 2024-02-08 MED ORDER — INSULIN ASPART 100 UNIT/ML IJ SOLN
0.0000 [IU] | Freq: Three times a day (TID) | INTRAMUSCULAR | Status: DC
  Administered 2024-02-09: 9 [IU] via SUBCUTANEOUS
  Administered 2024-02-09: 5 [IU] via SUBCUTANEOUS
  Administered 2024-02-09: 7 [IU] via SUBCUTANEOUS
  Administered 2024-02-10: 2 [IU] via SUBCUTANEOUS
  Administered 2024-02-10: 3 [IU] via SUBCUTANEOUS
  Administered 2024-02-10: 9 [IU] via SUBCUTANEOUS
  Administered 2024-02-11: 1 [IU] via SUBCUTANEOUS
  Filled 2024-02-08: qty 3
  Filled 2024-02-08: qty 7
  Filled 2024-02-08: qty 2
  Filled 2024-02-08: qty 1
  Filled 2024-02-08: qty 9
  Filled 2024-02-08: qty 5
  Filled 2024-02-08: qty 9

## 2024-02-08 MED ORDER — DM-GUAIFENESIN ER 30-600 MG PO TB12: 1.0000 | ORAL_TABLET | Freq: Two times a day (BID) | ORAL | Status: DC | PRN

## 2024-02-08 MED ORDER — INSULIN ASPART 100 UNIT/ML IJ SOLN
0.0000 [IU] | Freq: Every day | INTRAMUSCULAR | Status: DC
  Administered 2024-02-09: 3 [IU] via SUBCUTANEOUS
  Filled 2024-02-08: qty 3

## 2024-02-08 MED ORDER — ONDANSETRON HCL 4 MG/2ML IJ SOLN
4.0000 mg | Freq: Three times a day (TID) | INTRAMUSCULAR | Status: DC | PRN
  Administered 2024-02-09: 4 mg via INTRAVENOUS
  Filled 2024-02-08: qty 2

## 2024-02-08 MED ORDER — FUROSEMIDE 10 MG/ML IJ SOLN
40.0000 mg | Freq: Once | INTRAMUSCULAR | Status: AC
  Administered 2024-02-08: 40 mg via INTRAVENOUS
  Filled 2024-02-08: qty 4

## 2024-02-08 MED ORDER — METHYLPREDNISOLONE SODIUM SUCC 125 MG IJ SOLR
80.0000 mg | Freq: Every day | INTRAMUSCULAR | Status: DC
  Administered 2024-02-09 – 2024-02-10 (×2): 80 mg via INTRAVENOUS
  Filled 2024-02-08 (×2): qty 2

## 2024-02-08 MED ORDER — FUROSEMIDE 10 MG/ML IJ SOLN
40.0000 mg | Freq: Two times a day (BID) | INTRAMUSCULAR | Status: DC
  Administered 2024-02-09 – 2024-02-10 (×3): 40 mg via INTRAVENOUS
  Filled 2024-02-08 (×3): qty 4

## 2024-02-08 MED ORDER — IOHEXOL 350 MG/ML SOLN
75.0000 mL | Freq: Once | INTRAVENOUS | Status: AC | PRN
  Administered 2024-02-08: 75 mL via INTRAVENOUS

## 2024-02-08 MED ORDER — ALBUTEROL SULFATE (2.5 MG/3ML) 0.083% IN NEBU
2.5000 mg | INHALATION_SOLUTION | RESPIRATORY_TRACT | Status: DC | PRN
  Administered 2024-02-09: 2.5 mg via RESPIRATORY_TRACT
  Filled 2024-02-08: qty 3

## 2024-02-08 NOTE — ED Triage Notes (Signed)
 First nurse note: Pt to ED via [facility] from home. Pt's daughter reports has the Flu but having increased SOB. Pt had episode of emesis with EMS. Unable to obtain IV access. Pt RA sat 85% and placed on 4L [place] by EMS.   CBG 428 99 oral 147/72 HR 65

## 2024-02-08 NOTE — ED Provider Notes (Signed)
 "  [facility] Provider Note    Event Date/Time   First MD Initiated Contact with Patient 02/08/24 1848     (approximate)   History   Shortness of Breath   HPI  Kerri Mccullough is a 78 y.o. female with history of CAD, diabetes, GERD, hyperlipidemia, hypertension, presenting with shortness of breath and cough.  Has some chest wall pain, no abdominal pain, no recent travel or surgeries.  Not typically on oxygen.  Has bilateral lower extremity edema, left is greater than right.  She denies any hematochezia or melena.  No bleeding anywhere else.  Notes aching to her left lower extremity.  Per independent history from daughter, she does not have history of CHF, not on any diuretic.  Was seen by primary care a couple weeks ago for cough and congestion was told that she had bronchitis.  No history of asthma or COPD, remote smoker.  No history of blood clots or blood thinners.  States that she had a fever at home last couple days.  States that they also found out that her iron levels were low, will set up for follow-up to get her hemoglobin.  On independent review, she had a CT coronary that was done in 2024 that showed RCA stent, moderate mid LAD stenosis, mid proximal left circumflex gnosis.     Physical Exam   Triage Vital Signs: ED Triage Vitals  Encounter Vitals Group     BP 02/08/24 1822 (!) 159/57     Girls Systolic BP Percentile --      Girls Diastolic BP Percentile --      Boys Systolic BP Percentile --      Boys Diastolic BP Percentile --      Pulse Rate 02/08/24 1822 (!) 104     Resp 02/08/24 1822 (!) 24     Temp 02/08/24 1835 98.7 F (37.1 C)     Temp Source 02/08/24 1822 Oral     SpO2 02/08/24 1822 93 %     Weight 02/08/24 1832 170 lb (77.1 kg)     Height 02/08/24 1832 5' 3 (1.6 m)     Head Circumference --      Peak Flow --      Pain Score 02/08/24 1830 8     Pain Loc --      Pain Education --      Exclude from Growth Chart --     Most  recent vital signs: Vitals:   02/08/24 1822 02/08/24 1835  BP: (!) 159/57   Pulse: (!) 104   Resp: (!) 24   Temp:  98.7 F (37.1 C)  SpO2: 93%      General: Awake, no distress.  CV:  Good peripheral perfusion.  Resp:  Normal effort.  Crackles and sparse wheeze bilaterally Abd:  No distention.  Soft nontender Other:  Bilateral lower extremity edema, left is greater than right   ED Results / Procedures / Treatments   Labs (all labs ordered are listed, but only abnormal results are displayed) Labs Reviewed  RESP PANEL BY RT-PCR (RSV, FLU A&B, COVID)  RVPGX2 - Abnormal; Notable for the following components:      Result Value   Influenza A by PCR POSITIVE (*)    All other components within normal limits  BASIC METABOLIC PANEL WITH GFR - Abnormal; Notable for the following components:   Glucose, Bld 164 (*)    Creatinine, Ser 1.37 (*)    Calcium  8.7 (*)  GFR, Estimated 40 (*)    All other components within normal limits  CBC - Abnormal; Notable for the following components:   RBC 3.32 (*)    Hemoglobin 8.3 (*)    HCT 27.6 (*)    MCH 25.0 (*)    RDW 16.3 (*)    All other components within normal limits  TROPONIN T, HIGH SENSITIVITY - Abnormal; Notable for the following components:   Troponin T High Sensitivity 21 (*)    All other components within normal limits  GROUP A STREP BY PCR  LACTIC ACID, PLASMA  PRO BRAIN NATRIURETIC PEPTIDE  PROTIME-INR  APTT  TYPE AND SCREEN  TROPONIN T, HIGH SENSITIVITY     EKG  EKG shows, sinus rhythm, rate 68, normal QS, normal QTc, baseline is wandering due to patient movement, no obvious ischemic ST elevation, T wave flattening to inferior lateral leads, not significantly changed compared to prior   RADIOLOGY On my independent interpretation, CT's PE study without obvious PE   PROCEDURES:  Critical Care performed: Yes, see critical care procedure note(s)  .Critical Care  Performed by: Waymond Lorelle Cummins, MD Authorized by:  Waymond Lorelle Cummins, MD   Critical care provider statement:    Critical care time (minutes):  40   Critical care was necessary to treat or prevent imminent or life-threatening deterioration of the following conditions:  Respiratory failure   Critical care was time spent personally by me on the following activities:  Development of treatment plan with patient or surrogate, discussions with consultants, evaluation of patient's response to treatment, examination of patient, ordering and review of laboratory studies, ordering and review of radiographic studies, ordering and performing treatments and interventions, pulse oximetry, re-evaluation of patient's condition and review of old charts    MEDICATIONS ORDERED IN ED: Medications  albuterol  (VENTOLIN  HFA) 108 (90 Base) MCG/ACT inhaler 2 puff (has no administration in time range)  dextromethorphan-guaiFENesin  (MUCINEX  DM) 30-600 MG per 12 hr tablet 1 tablet (has no administration in time range)  furosemide  (LASIX ) injection 40 mg (40 mg Intravenous Given 02/08/24 2046)  acetaminophen  (TYLENOL ) tablet 1,000 mg (1,000 mg Oral Given 02/08/24 2041)  iohexol  (OMNIPAQUE ) 350 MG/ML injection 75 mL (75 mLs Intravenous Contrast Given 02/08/24 2024)     IMPRESSION / MDM / ASSESSMENT AND PLAN / ED COURSE  I reviewed the triage vital signs and the nursing notes.                              Differential diagnosis includes, but is not limited to, pneumonia, viral illness, CHF exacerbation, ACS, musculoskeletal pain, strain, did also consider PE, DVT.  Labs, EKG, troponin, chest x-ray, DVT ultrasound, BNP, respiratory swab.  Will give her some IV Lasix  here.  Tylenol .  Lidoderm  patch.  Patient's presentation is most consistent with acute presentation with potential threat to life or bodily function.  Independent interpretation of labs and imaging below.  Chest x-ray without focal pneumonia, showed cardiomegaly, diffuse bronchial thickening.  Get a CT PE study.   CT PE study did not show PE, did show cardiomegaly with bilateral pleural effusions and interstitial edema likely consistent with CHF.  Given her hypoxia, CHF exacerbation, influenza A, she will need to be admitted for further management.  Consult the hospitalist will admit the patient.  She is admitted.  The patient is on the cardiac monitor to evaluate for evidence of arrhythmia and/or significant heart rate changes.   Clinical Course  as of 02/08/24 2144  Thu Feb 08, 2024  1921 DG Chest 2 View IMPRESSION: 1. Cardiomegaly. 2. Diffuse bronchial thickening. 3. Hiatal hernia.   [TT]  1930 Independent review of labs, troponins mildly elevated, creatinine is mildly elevated but downtrending compared to prior, no leukocytosis, hemoglobin is 8, down from 10 2 months ago but patient denies any bleeding anywhere. [TT]  1931 Lactic Acid, Venous: 1.9 Not elevated. [TT]  2116 Influenza A By PCR(!): POSITIVE [TT]  2116 CT Angio Chest PE W/Cm &/Or Wo Cm IMPRESSION: 1. Negative for acute pulmonary embolus. 2. Cardiomegaly with small left and small moderate right pleural effusions. Mild diffuse septal thickening suggesting interstitial edema. 3. Stable bilateral pulmonary nodules measuring up to 7 mm. These are stable since 2021, no further follow-up imaging recommended 4. 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 5. Aortic atherosclerosis.  Aortic Atherosclerosis (ICD10-I70.0).   [TT]  2116 US  Venous Img Lower  Left (DVT Study) 1. No evidence of DVT.  [TT]    Clinical Course User Index [TT] Waymond Lorelle Cummins, MD     FINAL CLINICAL IMPRESSION(S) / ED DIAGNOSES   Final diagnoses:  Hypoxia  Dyspnea, unspecified type  Influenza A  Acute on chronic congestive heart failure, unspecified heart failure type (HCC)     Rx / DC Orders   ED Discharge Orders     None        Note:  This  document was prepared using Dragon voice recognition software and may include unintentional dictation errors.    Waymond Lorelle Cummins, MD 02/08/24 [phone]  "

## 2024-02-08 NOTE — ED Triage Notes (Signed)
 Pt to ED from home with daughter [facility] for SOB since 3 weeks, increasing in severity. Also chest pressure since 1 week. Recently diagnosed for sinusitis and bronchitis. Recent flu exposure. Was 85% on RA wioth EMS and placed on 4L. Vomited twice with EMS, unable to get IV access.   EMS VSS except CBG was 428.   Respirations appear labored. Able to speak in full sentences. Pt appears pale, recent labs showed iron of 60 and has upcoming appt with hematologist.

## 2024-02-08 NOTE — H&P (Signed)
 " History and Physical    Kerri Mccullough FMW:[id number] DOB: 27-Apr-1945 DOA: 02/08/2024  Referring MD/NP/PA:   PCP: Autry Grayce LABOR, PA   Patient coming from:  The patient is coming from home.     Chief Complaint: SOB  HPI: Kerri Mccullough is a 78 y.o. female with medical history significant of HTN, HLD, DM, GERD, depression, CAD, s/p of stent placement, squamous skin cancer, pancreatic mass, CKD-3b, anemia, obesity, who presents with SOB.  Patient stated she has been sick in the past several day.  She has cough with little mucus production, body aches, SOB which has been progressively worsening.  No fever, chills.  She also mild frontal chest pain.  She also has nausea and vomited few times earlier, no diarrhea or abdominal pain.  No symptoms of UTI.  Patient denies dark stool or rectal bleeding.  She states that her stool is brown color.  She has contact with family members with Flu.  She has bilateral lower leg edema.  Patient is normally not using oxygen, but was found to have oxygen desaturation to 85% on room air, which improved to 93% of 42 oxygen.  Patient has acute respiratory distress, with difficulty speaking in full sentence in ED.   Data reviewed independently and ED Course: pt was found to have positive flu A PCR, BNP 1115, WBC 8.4, stable renal function, troponin 21, temperature normal, blood pressure 159/57, heart rate 104, RR 24.  Lower extremity venous Doppler negative for DVT.  Patient is admitted to PCU as inpatient.  Chest x-ray: 1. Cardiomegaly. 2. Diffuse bronchial thickening. 3. Hiatal hernia.    CTA: 1. Negative for acute pulmonary embolus. 2. Cardiomegaly with small left and small moderate right pleural effusions. Mild diffuse septal thickening suggesting interstitial edema. 3. Stable bilateral pulmonary nodules measuring up to 7 mm. These are stable since 2021, no further follow-up imaging recommended 4. 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 5. Aortic atherosclerosis.   Aortic Atherosclerosis (ICD10-I70.0).     EKG: I have personally reviewed.  Sinus rhythm, QTc 380, nonspecific T wave change.   Review of Systems:   General: no fevers, chills, no body weight gain, has poor appetite, has fatigue HEENT: no blurry vision, hearing changes or sore throat Respiratory: has dyspnea, coughing CV: has chest pain, no palpitations GI: no nausea, vomiting, abdominal pain, diarrhea, constipation GU: no dysuria, burning on urination, increased urinary frequency, hematuria  Ext: has leg edema Neuro: no unilateral weakness, numbness, or tingling, no vision change or hearing loss Skin: no rash, no skin tear. MSK: No muscle spasm, no deformity, no limitation of range of movement in spin Heme: No easy bruising.  Travel history: No recent long distant travel.   Allergy: Allergies[1]  Past Medical History:  Diagnosis Date   Actinic keratosis    Coronary artery disease    Diabetes mellitus without complication (HCC)    GERD (gastroesophageal reflux disease)    Heart attack (HCC)    High cholesterol    Hypertension    Squamous cell carcinoma of skin    R cheek - treated at [facility]    Past Surgical History:  Procedure Laterality Date   ABDOMINAL HYSTERECTOMY     CHOLECYSTECTOMY     COLONOSCOPY WITH PROPOFOL  N/A 10/14/2019   Procedure: COLONOSCOPY WITH PROPOFOL ;  Surgeon: Maryruth Ole DASEN, MD;  Location: [facility] ENDOSCOPY;  Service: Endoscopy;  Laterality: N/A;  CORONARY ANGIOPLASTY WITH STENT PLACEMENT     ESOPHAGOGASTRODUODENOSCOPY (EGD) WITH PROPOFOL  N/A 10/14/2019   Procedure: ESOPHAGOGASTRODUODENOSCOPY (EGD) WITH PROPOFOL ;  Surgeon: Maryruth Ole DASEN, MD;  Location: [facility] ENDOSCOPY;  Service: Endoscopy;  Laterality: N/A;   LEFT HEART CATH AND CORONARY ANGIOGRAPHY N/A 05/07/2018   Procedure: LEFT HEART CATH AND CORONARY  ANGIOGRAPHY with possible PCI and stent;  Surgeon: Florencio Cara BIRCH, MD;  Location: [facility] INVASIVE CV LAB;  Service: Cardiovascular;  Laterality: N/A;    Social History:  reports that she has quit smoking. Her smoking use included cigarettes. She has been exposed to tobacco smoke. She has never used smokeless tobacco. She reports that she does not drink alcohol and does not use drugs.  Family History:  Family History  Problem Relation Age of Onset   Diabetes Mother    Heart failure Mother    Lung cancer Father    Lung cancer Sister    Lung cancer Brother      Prior to Admission medications  Medication Sig Start Date End Date Taking? Authorizing Provider  amLODipine  (NORVASC ) 5 MG tablet Take 5 mg by mouth 2 (two) times daily. 04/05/18   [provider]  aspirin  EC 81 MG tablet Take 81 mg by mouth daily. 11/09/11   [provider]  atorvastatin  (LIPITOR) 20 MG tablet Take 20 mg by mouth daily. 06/28/21   [provider]  carvedilol  (COREG ) 6.25 MG tablet Take 1 tablet (6.25 mg total) by mouth 2 (two) times daily with a meal. 07/20/21   Swayze, Ava, DO  citalopram  (CELEXA ) 20 MG tablet Take 20 mg by mouth daily. 04/05/18   [provider]  estradiol  (ESTRACE  VAGINAL) 0.1 MG/GM vaginal cream Estrogen Cream Instruction Discard applicator Apply pea sized amount to tip of finger to urethra before bed. Wash hands well after application. Use Monday, Wednesday and Friday 10/06/23   Helon Kirsch A, PA-C  metFORMIN  (GLUCOPHAGE ) 1000 MG tablet Take 1 tablet by mouth 2 (two) times daily. 08/24/19   [provider]  metoCLOPramide  (REGLAN ) 10 MG tablet Take 1 tablet (10 mg total) by mouth every 8 (eight) hours as needed for nausea. 09/23/23 09/22/24  Dorothyann Drivers, MD  MYRBETRIQ  50 MG TB24 tablet TAKE 1 TABLET BY MOUTH DAILY 09/22/23   Helon Kirsch A, PA-C  nitrofurantoin  (MACRODANTIN ) 100 MG capsule Take 1 capsule (100 mg total) by mouth daily. 08/16/23    Helon Kirsch A, PA-C  omeprazole (PRILOSEC) 40 MG capsule Take 40 mg by mouth daily. 04/05/18   [provider]  OZEMPIC, 0.25 OR 0.5 MG/DOSE, 2 MG/3ML SOPN Inject into the skin. 10/08/22   [provider]    Physical Exam: Vitals:   02/08/24 1822 02/08/24 1832 02/08/24 1835 02/08/24 2130  BP: (!) 159/57   121/79  Pulse: (!) 104   76  Resp: (!) 24   (!) 34  Temp:   98.7 F (37.1 C)   TempSrc: Oral  Oral   SpO2: 93%   96%  Weight:  77.1 kg    Height:  5' 3 (1.6 m)     General: Has acute respiratory distress HEENT:       Eyes: PERRL, EOMI, no jaundice       ENT: No discharge from the ears and nose, no pharynx injection, no tonsillar enlargement.        Neck: No JVD, no bruit, no mass felt. Heme: No neck lymph node enlargement. Cardiac: S1/S2, RRR, No gallops or rubs. Respiratory: Has loud  coarse breath sound bilaterally GI: Soft, nondistended, nontender, no rebound pain, no organomegaly, BS present. GU: No hematuria Ext: 1+  pitting leg edema bilaterally. 1+DP/PT pulse bilaterally. Musculoskeletal: No joint deformities, No joint redness or warmth, no limitation of ROM in spin. Skin: No rashes.  Neuro: Alert, oriented X3, cranial nerves II-XII grossly intact, moves all extremities normally. Psych: Patient is not psychotic, no suicidal or hemocidal ideation.  Labs on Admission: I have personally reviewed following labs and imaging studies  CBC: Recent Labs  Lab 02/08/24 1833  WBC 8.4  HGB 8.3*  HCT 27.6*  MCV 83.1  PLT 238   Basic Metabolic Panel: Recent Labs  Lab 02/08/24 1833  NA 135  K 4.3  CL 100  CO2 22  GLUCOSE 164*  BUN 16  CREATININE 1.37*  CALCIUM  8.7*   GFR: Estimated Creatinine Clearance: 33.8 mL/min (A) (by C-G formula based on SCr of 1.37 mg/dL (H)). Liver Function Tests: No results for input(s): AST, ALT, ALKPHOS, BILITOT, PROT, ALBUMIN in the last 168 hours. No results for input(s): LIPASE, AMYLASE in the  last 168 hours. No results for input(s): AMMONIA in the last 168 hours. Coagulation Profile: No results for input(s): INR, PROTIME in the last 168 hours. Cardiac Enzymes: No results for input(s): CKTOTAL, CKMB, CKMBINDEX, TROPONINI in the last 168 hours. BNP (last 3 results) Recent Labs    02/08/24 2119  PROBNP 1,115.0*   HbA1C: No results for input(s): HGBA1C in the last 72 hours. CBG: Recent Labs  Lab 02/08/24 2208  GLUCAP 170*   Lipid Profile: No results for input(s): CHOL, HDL, LDLCALC, TRIG, CHOLHDL, LDLDIRECT in the last 72 hours. Thyroid  Function Tests: No results for input(s): TSH, T4TOTAL, FREET4, T3FREE, THYROIDAB in the last 72 hours. Anemia Panel: No results for input(s): VITAMINB12, FOLATE, FERRITIN, TIBC, IRON, RETICCTPCT in the last 72 hours. Urine analysis:    Component Value Date/Time   COLORURINE YELLOW (A) 09/23/2023 1015   APPEARANCEUR Hazy (A) 10/06/2023 0957   LABSPEC 1.014 09/23/2023 1015   PHURINE 6.0 09/23/2023 1015   GLUCOSEU Negative 10/06/2023 0957   HGBUR NEGATIVE 09/23/2023 1015   BILIRUBINUR Negative 10/06/2023 0957   KETONESUR NEGATIVE 09/23/2023 1015   PROTEINUR Trace 10/06/2023 0957   PROTEINUR NEGATIVE 09/23/2023 1015   NITRITE Negative 10/06/2023 0957   NITRITE NEGATIVE 09/23/2023 1015   LEUKOCYTESUR 1+ (A) 10/06/2023 0957   LEUKOCYTESUR TRACE (A) 09/23/2023 1015   Sepsis Labs: @LABRCNTIP (procalcitonin:4,lacticidven:4) ) Recent Results (from the past 240 hours)  Resp panel by RT-PCR (RSV, Flu A&B, Covid) Anterior Nasal Swab     Status: Abnormal   Collection Time: 02/08/24  8:00 PM   Specimen: Anterior Nasal Swab  Result Value Ref Range Status   SARS Coronavirus 2 by RT PCR NEGATIVE NEGATIVE Final    Comment: (NOTE) SARS-CoV-2 target nucleic acids are NOT DETECTED.  The SARS-CoV-2 RNA is generally detectable in upper respiratory specimens during the acute phase of infection.  The lowest concentration of SARS-CoV-2 viral copies this assay can detect is 138 copies/mL. A negative result does not preclude SARS-Cov-2 infection and should not be used as the sole basis for treatment or other patient management decisions. A negative result may occur with  improper specimen collection/handling, submission of specimen other than nasopharyngeal swab, presence of viral mutation(s) within the areas targeted by this assay, and inadequate number of viral copies(<138 copies/mL). A negative result must be combined with clinical observations, patient history, and epidemiological information. The expected result is Negative.  Fact  Sheet for Patients:  bloggercourse.com  Fact Sheet for Healthcare Providers:  seriousbroker.it  This test is no t yet approved or cleared by the [place]  FDA and  has been authorized for detection and/or diagnosis of SARS-CoV-2 by FDA under an Emergency Use Authorization (EUA). This EUA will remain  in effect (meaning this test can be used) for the duration of the COVID-19 declaration under Section 564(b)(1) of the Act, 21 U.S.C.section 360bbb-3(b)(1), unless the authorization is terminated  or revoked sooner.       Influenza A by PCR POSITIVE (A) NEGATIVE Final   Influenza B by PCR NEGATIVE NEGATIVE Final    Comment: (NOTE) The Xpert Xpress SARS-CoV-2/FLU/RSV plus assay is intended as an aid in the diagnosis of influenza from Nasopharyngeal swab specimens and should not be used as a sole basis for treatment. Nasal washings and aspirates are unacceptable for Xpert Xpress SARS-CoV-2/FLU/RSV testing.  Fact Sheet for Patients: bloggercourse.com  Fact Sheet for Healthcare Providers: seriousbroker.it  This test is not yet approved or cleared by the [place]  FDA and has been authorized for detection and/or diagnosis of SARS-CoV-2  by FDA under an Emergency Use Authorization (EUA). This EUA will remain in effect (meaning this test can be used) for the duration of the COVID-19 declaration under Section 564(b)(1) of the Act, 21 U.S.C. section 360bbb-3(b)(1), unless the authorization is terminated or revoked.     Resp Syncytial Virus by PCR NEGATIVE NEGATIVE Final    Comment: (NOTE) Fact Sheet for Patients: bloggercourse.com  Fact Sheet for Healthcare Providers: seriousbroker.it  This test is not yet approved or cleared by the [place]  FDA and has been authorized for detection and/or diagnosis of SARS-CoV-2 by FDA under an Emergency Use Authorization (EUA). This EUA will remain in effect (meaning this test can be used) for the duration of the COVID-19 declaration under Section 564(b)(1) of the Act, 21 U.S.C. section 360bbb-3(b)(1), unless the authorization is terminated or revoked.  Performed at [facility], [address], [place], [place] [address]      Radiological Exams on Admission:   Assessment/Plan Principal Problem:   Influenza A Active Problems:   Acute CHF (HCC)   Acute respiratory failure with hypoxia (HCC)   Myocardial injury   Hypertension   CAD (coronary artery disease)   Type II diabetes mellitus with renal manifestations (HCC)   HLD (hyperlipidemia)   Chronic kidney disease, stage 3b (HCC)   Normocytic anemia   Depression   Obesity (BMI 30-39.9)   Assessment and Plan:  Principal Problem:   Influenza A Active Problems:   Acute CHF (HCC)   Acute respiratory failure with hypoxia (HCC)   Myocardial injury   Hypertension   CAD (coronary artery disease)   Type II diabetes mellitus with renal manifestations (HCC)   HLD (hyperlipidemia)   Chronic kidney disease, stage 3b (HCC)   Normocytic anemia   Depression   Obesity (BMI 30-39.9)    DVT ppx: SQ Lovenox   Code Status: DNR (I discussed with patient, and  explained the meaning of CODE STATUS. Patient wants to be DNR)  Family Communication:   Yes, patient's sister at bed side.     Disposition Plan:  Anticipate discharge back to previous environment  Consults called:  none  Admission status and Level of care: Progressiveas inpt        Dispo: The patient is from: Home              Anticipated d/c is to: Home  Anticipated d/c date is: 2 days              Patient currently is not medically stable to d/c.    Severity of Illness:  The appropriate patient status for this patient is INPATIENT. Inpatient status is judged to be reasonable and necessary in order to provide the required intensity of service to ensure the patient's safety. The patient's presenting symptoms, physical exam findings, and initial radiographic and laboratory data in the context of their chronic comorbidities is felt to place them at high risk for further clinical deterioration. Furthermore, it is not anticipated that the patient will be medically stable for discharge from the hospital within 2 midnights of admission.   * I certify that at the point of admission it is my clinical judgment that the patient will require inpatient hospital care spanning beyond 2 midnights from the point of admission due to high intensity of service, high risk for further deterioration and high frequency of surveillance required.*       Date of Service 02/08/2024    Caleb Exon [facility]   If 7PM-7AM, please contact night-coverage [email or web address] 02/08/2024, 10:28 PM     [1]  Allergies Allergen Reactions   Ace Inhibitors Swelling   Rosiglitazone Other (See Comments)    Other Reaction: SOB, Pulmonary Edema Other Reaction: SOB, Pulmonary Edema Other Reaction: SOB, Pulmonary Edema    Aspirin  Swelling    When taken with a beta blocker    Golytely [Peg [id number]-Kcl-Nabcb-Nacl-Nasulf] Swelling   "

## 2024-02-09 ENCOUNTER — Inpatient Hospital Stay
Admit: 2024-02-09 | Discharge: 2024-02-09 | Disposition: A | Discharge status: Home / Self Care | Attending: Internal Medicine | Admitting: Internal Medicine

## 2024-02-09 DIAGNOSIS — J101 Influenza due to other identified influenza virus with other respiratory manifestations: Secondary | ICD-10-CM | POA: Diagnosis not present

## 2024-02-09 LAB — HEMOGLOBIN A1C
Hgb A1c MFr Bld: 7.7 % — ABNORMAL HIGH (ref 4.8–5.6)
Mean Plasma Glucose: 174.29 mg/dL

## 2024-02-09 LAB — CBC
HCT: 24.6 % — ABNORMAL LOW (ref 36.0–46.0)
Hemoglobin: 7.5 g/dL — ABNORMAL LOW (ref 12.0–15.0)
MCH: 25.3 pg — ABNORMAL LOW (ref 26.0–34.0)
MCHC: 30.5 g/dL (ref 30.0–36.0)
MCV: 82.8 fL (ref 80.0–100.0)
Platelets: 189 K/uL (ref 150–400)
RBC: 2.97 MIL/uL — ABNORMAL LOW (ref 3.87–5.11)
RDW: 16.3 % — ABNORMAL HIGH (ref 11.5–15.5)
WBC: 4.9 K/uL (ref 4.0–10.5)
nRBC: 0 % (ref 0.0–0.2)

## 2024-02-09 LAB — IRON AND TIBC
Iron: 18 ug/dL — ABNORMAL LOW (ref 28–170)
Saturation Ratios: 4 % — ABNORMAL LOW (ref 10.4–31.8)
TIBC: 437 ug/dL (ref 250–450)
UIBC: 419 ug/dL

## 2024-02-09 LAB — PROTIME-INR
INR: 1.2 (ref 0.8–1.2)
Prothrombin Time: 15.4 s — ABNORMAL HIGH (ref 11.4–15.2)

## 2024-02-09 LAB — ECHOCARDIOGRAM COMPLETE
AR max vel: 2.37 cm2
AV Area VTI: 2.24 cm2
AV Area mean vel: 2.29 cm2
AV Mean grad: 6 mmHg
AV Peak grad: 11.3 mmHg
Ao pk vel: 1.68 m/s
Area-P 1/2: 5.54 cm2
Calc EF: 58.3 %
Height: 64 in
MV VTI: 2.15 cm2
S' Lateral: 3.5 cm
Single Plane A2C EF: 61.6 %
Single Plane A4C EF: 48.1 %
Weight: 2776.03 [oz_av]

## 2024-02-09 LAB — RETICULOCYTES
Immature Retic Fract: 31.6 % — ABNORMAL HIGH (ref 2.3–15.9)
RBC.: 3.1 MIL/uL — ABNORMAL LOW (ref 3.87–5.11)
Retic Count, Absolute: 60.5 K/uL (ref 19.0–186.0)
Retic Ct Pct: 2 % (ref 0.4–3.1)

## 2024-02-09 LAB — BASIC METABOLIC PANEL WITH GFR
Anion gap: 12 (ref 5–15)
BUN: 19 mg/dL (ref 8–23)
CO2: 22 mmol/L (ref 22–32)
Calcium: 8.4 mg/dL — ABNORMAL LOW (ref 8.9–10.3)
Chloride: 99 mmol/L (ref 98–111)
Creatinine, Ser: 1.5 mg/dL — ABNORMAL HIGH (ref 0.44–1.00)
GFR, Estimated: 35 mL/min — ABNORMAL LOW
Glucose, Bld: 328 mg/dL — ABNORMAL HIGH (ref 70–99)
Potassium: 4.4 mmol/L (ref 3.5–5.1)
Sodium: 133 mmol/L — ABNORMAL LOW (ref 135–145)

## 2024-02-09 LAB — MAGNESIUM: Magnesium: 1.7 mg/dL (ref 1.7–2.4)

## 2024-02-09 LAB — APTT: aPTT: 34 s (ref 24–36)

## 2024-02-09 LAB — VITAMIN B12: Vitamin B-12: 359 pg/mL (ref 180–914)

## 2024-02-09 LAB — PREPARE RBC (CROSSMATCH)

## 2024-02-09 LAB — GLUCOSE, CAPILLARY
Glucose-Capillary: 366 mg/dL — ABNORMAL HIGH (ref 70–99)
Glucose-Capillary: 310 mg/dL — ABNORMAL HIGH (ref 70–99)
Glucose-Capillary: 284 mg/dL — ABNORMAL HIGH (ref 70–99)
Glucose-Capillary: 275 mg/dL — ABNORMAL HIGH (ref 70–99)

## 2024-02-09 LAB — FOLATE: Folate: 10.8 ng/mL

## 2024-02-09 LAB — FERRITIN: Ferritin: 26 ng/mL (ref 11–307)

## 2024-02-09 LAB — ABO/RH: ABO/RH(D): O POS

## 2024-02-09 MED ORDER — CITALOPRAM HYDROBROMIDE 20 MG PO TABS
20.0000 mg | ORAL_TABLET | Freq: Every day | ORAL | Status: DC
  Administered 2024-02-09 – 2024-02-11 (×3): 20 mg via ORAL
  Filled 2024-02-09 (×3): qty 1

## 2024-02-09 MED ORDER — ATORVASTATIN CALCIUM 20 MG PO TABS
20.0000 mg | ORAL_TABLET | Freq: Every day | ORAL | Status: DC
  Administered 2024-02-09 – 2024-02-11 (×3): 20 mg via ORAL
  Filled 2024-02-09 (×3): qty 1

## 2024-02-09 MED ORDER — CARVEDILOL 6.25 MG PO TABS
6.2500 mg | ORAL_TABLET | Freq: Two times a day (BID) | ORAL | Status: DC
  Administered 2024-02-09 – 2024-02-11 (×5): 6.25 mg via ORAL
  Filled 2024-02-09 (×5): qty 1

## 2024-02-09 MED ORDER — SODIUM CHLORIDE 0.9% IV SOLUTION: Freq: Once | INTRAVENOUS | Status: AC

## 2024-02-09 MED ORDER — FERROUS SULFATE 325 (65 FE) MG PO TABS
325.0000 mg | ORAL_TABLET | Freq: Two times a day (BID) | ORAL | Status: DC
  Administered 2024-02-09 – 2024-02-11 (×4): 325 mg via ORAL
  Filled 2024-02-09 (×4): qty 1

## 2024-02-09 MED ORDER — AMLODIPINE BESYLATE 5 MG PO TABS
5.0000 mg | ORAL_TABLET | Freq: Two times a day (BID) | ORAL | Status: DC
  Administered 2024-02-09 – 2024-02-10 (×3): 5 mg via ORAL
  Filled 2024-02-09 (×3): qty 1

## 2024-02-09 MED ORDER — IPRATROPIUM-ALBUTEROL 0.5-2.5 (3) MG/3ML IN SOLN
3.0000 mL | Freq: Two times a day (BID) | RESPIRATORY_TRACT | Status: AC
  Administered 2024-02-10 – 2024-02-11 (×3): 3 mL via RESPIRATORY_TRACT
  Filled 2024-02-09 (×3): qty 3

## 2024-02-09 MED ORDER — INSULIN GLARGINE 100 UNIT/ML ~~LOC~~ SOLN
15.0000 [IU] | Freq: Two times a day (BID) | SUBCUTANEOUS | Status: DC
  Administered 2024-02-09 – 2024-02-11 (×5): 15 [IU] via SUBCUTANEOUS
  Filled 2024-02-09 (×6): qty 0.15

## 2024-02-09 MED ORDER — PANTOPRAZOLE SODIUM 40 MG PO TBEC
40.0000 mg | DELAYED_RELEASE_TABLET | Freq: Every day | ORAL | Status: DC
  Administered 2024-02-09 – 2024-02-11 (×3): 40 mg via ORAL
  Filled 2024-02-09 (×3): qty 1

## 2024-02-09 NOTE — Progress Notes (Signed)
 Mobility Specialist - Progress Note  Pre-mobility: SpO2-92%  During mobility:  SpO2-88% recovered to >89 in < 30 sec.  Post-mobility:  SPO2- 93%   02/09/24 1352  Mobility  Activity Ambulated with assistance;Stood at bedside;Respositioned in chair  Level of Assistance Contact guard assist, steadying assist  Assistive Device Front wheel walker  Distance Ambulated (ft) 25 ft  Range of Motion/Exercises Active  Activity Response Tolerated well  Mobility visit 1 Mobility  Mobility Specialist Start Time (ACUTE ONLY) 1318  Mobility Specialist Stop Time (ACUTE ONLY) 1335  Mobility Specialist Time Calculation (min) (ACUTE ONLY) 17 min   Pt was in the recliner on RA upon entry. Pt agreed to mobility. Pt was able to complete activities on RA. Pt O2 vitals were taken as a precaution. Pt did desat to 88%, but was able to recover with PBT. Pt was able to STS with 2 WW. Pt ambulated well within room. After activity pt returned to recliner with needs in reach and chair alarm on.  Clem Rodes Mobility Specialist 02/09/2024, 1:56 PM

## 2024-02-09 NOTE — Progress Notes (Signed)
 " Progress Note   Patient: Kerri Mccullough DOB: Aug 24, 1945 DOA: 02/08/2024     1 DOS: the patient was seen and examined on 02/09/2024   Brief hospital course:  Kerri Mccullough is a 78 y.o. female with medical history significant of HTN, HLD, DM, GERD, depression, CAD, s/p of stent placement, squamous skin cancer, pancreatic mass, CKD-3b, anemia, obesity, who presents with SOB. Patient was found to have positive flu A PCR, BNP 1115. Patient therefore admitted for acute respiratory failure due to influenza A as well as CHF exacerbation  Assessment and Plan:  Influenza A: CXR showed diffuse bronchial thickening. Currently requiring 4 L of intranasal oxygen - Solu-Medrol  125 mg, then 80 mg daily - Bronchodilators and as needed Mucinex  - Incentive spirometry - Tamiflu  30 mg twice daily   Acute heart failure with preserved ejection fraction. Patient has leg edema, elevated BNP 1115, indicating possible acute CHF.   Echo obtained on 1225 showing EF 55 to 60% with grade 2 diastolic dysfunction Continue IV Lasix  Monitor input and output closely as well as daily weight We will introduce other heart failure medication as blood pressure allows   Acute respiratory failure with hypoxia (HCC): Has 4L new oxygen requirement.  Due to combination for flu A infection and acute CHF. Continue management as above   Myocardial injury and history of CAD: S/p of stent placement.  Troponin 21 -> 20.  Likely demand ischemia. - Lipitor - Patient is allergic to aspirin    Hypertension Continue Coreg  and amlodipine  - IV hydralazine  as needed   Type II diabetes mellitus with renal manifestations (HCC): With hyperglycemia  A1c 8.5 on 2023, no recent A1c data available.  Patient is taking metformin , Ozempic and Trulicity at home. Continue current insulin  therapy A1c 7.7   HLD (hyperlipidemia) -Lipitor   Chronic kidney disease, stage 3b [facility]): Renal function stable.  Baseline creatinine  1.50 09/23/2023.  Her creatinine 1.37, BUN 16, GFR 40. -Follow-up with BMP   Normocytic anemia: Hemoglobin 10.1 on 09/23/2023 --> 8.3.  Patient denies rectal bleeding or dark stool.  Patient has history of iron deficiency anemia.   Anemia panel showing possible iron deficiency anemia Initiated on ferrous sulfate    Depression Continue Celexa    Chronic L1 and L2 compression fracture Patient able to work with physical therapy with no pain  Overweight-BMI 29.7 Consider weight loss when medically stable  DVT ppx: SQ Lovenox    Code Status: DNR   Family Communication:   No family present at bedside   Disposition Plan:  Anticipate discharge back to previous environment   Consults called:  none  Subjective:  Patient seen and examined at bedside this morning Currently on 4 L of intranasal oxygen Patient does not use any oxygen at home Denies chest pain abdominal pain Still having a cough   Physical Exam: General: Has acute respiratory distress Cardiac: S1/S2, RRR, No gallops or rubs. Respiratory: Has loud coarse breath sound bilaterally GI: Soft, nondistended, nontender, GU: No hematuria Ext: Bilateral lower extremity pitting edema noted Musculoskeletal: No joint deformities, No joint redness or warmth, no limitation of ROM in spin. Skin: No rashes.  Neuro: Alert, oriented X3, cranial nerves II-XII grossly intact, moves all extremities normally. Psych: Patient is not psychotic, no suicidal or hemocidal ideation.    Vitals:   02/09/24 1227 02/09/24 1317 02/09/24 1423 02/09/24 1504  BP: 125/63  (!) 145/61 (!) 142/53  Pulse: 85  85 75  Resp: 16  18 18   Temp: 98.4 F (36.9  C)  98.3 F (36.8 C) 98.2 F (36.8 C)  TempSrc:   Oral Oral  SpO2: 93% (!) 89% 94% 93%  Weight:      Height:        Data Reviewed: I have reviewed patient chest x-ray as well as CT angio that did not show any acute PE however showed cardiomegaly with some bilateral pleural effusion    Latest Ref Rng &  Units 02/09/2024    5:01 AM 02/08/2024    6:33 PM 09/23/2023   10:15 AM  CBC  WBC 4.0 - 10.5 K/uL 4.9  8.4  5.3   Hemoglobin 12.0 - 15.0 g/dL 7.5  8.3  89.8   Hematocrit 36.0 - 46.0 % 24.6  27.6  32.7   Platelets 150 - 400 K/uL 189  238  207        Latest Ref Rng & Units 02/09/2024    5:01 AM 02/08/2024    6:33 PM 09/23/2023   10:15 AM  BMP  Glucose 70 - 99 mg/dL 671  835  876   BUN 8 - 23 mg/dL 19  16  19    Creatinine 0.44 - 1.00 mg/dL 8.49  8.62  8.49   Sodium 135 - 145 mmol/L 133  135  138   Potassium 3.5 - 5.1 mmol/L 4.4  4.3  3.9   Chloride 98 - 111 mmol/L 99  100  101   CO2 22 - 32 mmol/L 22  22  24    Calcium  8.9 - 10.3 mg/dL 8.4  8.7  9.2       Author: Drue ONEIDA Potter, MD 02/09/2024 4:23 PM  For on call review [email or web address].  "

## 2024-02-09 NOTE — Evaluation (Signed)
 Occupational Therapy Evaluation Patient Details Name: Kerri Mccullough MRN: [id number] DOB: 1945/11/06 Today's Date: 02/09/2024   History of Present Illness   Kerri Mccullough is a 78 y.o. female with medical history significant of HTN, HLD, DM, GERD, depression, CAD, s/p of stent placement, squamous skin cancer, pancreatic mass, CKD-3b, anemia, obesity, who presents with SOB. Patient tested positive for influenza A     Clinical Impressions Patient was seen for OT evaluation this date. Prior to hospital admission, patient was independent with ADLs and ambulating without AD however uses furniture/walls to steady self. She lives with son and DIL and her 2 daughters live next door, she has 24/7 supervision/assist available as needed. Patient presents with deficits in standing balance/tolerance and overall activity tolerance, affecting safe and optimal ADL completion. patient on 4L at start of O2, O2 96%, turned O2 to 2L and she remained above 92-93% during remainer of eval Patient is currently requiring increased time and effort to manage ADLs.  Paient would benefit from skilled OT services to address noted impairments and functional limitations (see below for any additional details) in order to maximize safety and independence while minimizing future risk of falls, injury, and readmission.  Anticipate the need for follow up OT services upon acute hospital DC.      If plan is discharge home, recommend the following:   A little help with walking and/or transfers;A little help with bathing/dressing/bathroom     Functional Status Assessment   Patient has had a recent decline in their functional status and demonstrates the ability to make significant improvements in function in a reasonable and predictable amount of time.     Equipment Recommendations   None recommended by OT     Recommendations for Other Services         Precautions/Restrictions   Precautions Precautions:  Fall Recall of Precautions/Restrictions: Intact Restrictions Weight Bearing Restrictions Per Provider Order: No     Mobility Bed Mobility               General bed mobility comments: not assessed, patient sitting EOB at start of tx, finished tx in recliner    Transfers Overall transfer level: Needs assistance Equipment used: Rolling walker (2 wheels) Transfers: Bed to chair/wheelchair/BSC       Step pivot transfers: Contact guard assist, Min assist     General transfer comment: cues for hand placement, steadying A once in stance, cues for body mechanics when descending      Balance Overall balance assessment: Needs assistance Sitting-balance support: Feet supported, No upper extremity supported Sitting balance-Leahy Scale: Good     Standing balance support: Reliant on assistive device for balance Standing balance-Leahy Scale: Fair                             ADL either performed or assessed with clinical judgement   ADL Overall ADL's : Needs assistance/impaired Eating/Feeding: Independent       Upper Body Bathing: Set up;Sitting   Lower Body Bathing: Contact guard assist;Sit to/from stand   Upper Body Dressing : Set up;Sitting   Lower Body Dressing: Contact guard assist;Sit to/from stand   Toilet Transfer: Contact guard assist;Rolling walker (2 wheels)   Toileting- Clothing Manipulation and Hygiene: Contact guard assist;Sit to/from stand       Functional mobility during ADLs: Contact guard assist;Rolling walker (2 wheels) General ADL Comments: OT instructed patient on energy conservation techniques to implement into ADL routine  Vision         Perception         Praxis         Pertinent Vitals/Pain Pain Assessment Pain Assessment: No/denies pain     Extremity/Trunk Assessment Upper Extremity Assessment Upper Extremity Assessment: Generalized weakness   Lower Extremity Assessment Lower Extremity Assessment: Defer to  PT evaluation       Communication Communication Communication: No apparent difficulties   Cognition Arousal: Alert Behavior During Therapy: WFL for tasks assessed/performed Cognition: No apparent impairments                               Following commands: Intact       Cueing  General Comments   Cueing Techniques: Verbal cues  patient on 4L at start of O2, O2 96%, turned O2 to 2L and she remained above 92-93% during remainer of eval   Exercises     Shoulder Instructions      Home Living Family/patient expects to be discharged to:: Private residence Living Arrangements: Other relatives Available Help at Discharge: Available 24 hours/day;Family Type of Home: House Home Access: Ramped entrance     Home Layout: One level     Bathroom Shower/Tub: Chief Strategy Officer: Handicapped height Bathroom Accessibility: Yes How Accessible: Accessible via walker Home Equipment: Agricultural Consultant (2 wheels);BSC/3in1;Shower seat;Grab bars - tub/shower          Prior Functioning/Environment Prior Level of Function : Independent/Modified Independent             Mobility Comments: does not use assistive device but reports everything is close enough that I can hold onto things while I walk around. ADLs Comments: independent    OT Problem List: Decreased strength;Decreased activity tolerance;Impaired balance (sitting and/or standing)   OT Treatment/Interventions: Self-care/ADL training;Therapeutic exercise;Energy conservation;DME and/or AE instruction;Patient/family education;Balance training      OT Goals(Current goals can be found in the care plan section)   Acute Rehab OT Goals Patient Stated Goal: to go home OT Goal Formulation: With patient Time For Goal Achievement: 02/23/24 Potential to Achieve Goals: Good ADL Goals Pt Will Perform Grooming: with supervision;standing Pt Will Perform Lower Body Dressing: with supervision;sit to/from  stand Pt Will Transfer to Toilet: with supervision;ambulating;regular height toilet Pt Will Perform Tub/Shower Transfer: with supervision;Tub transfer;ambulating   OT Frequency:  Min 2X/week    Co-evaluation              AM-PAC OT 6 Clicks Daily Activity     Outcome Measure Help from another person eating meals?: None Help from another person taking care of personal grooming?: None Help from another person toileting, which includes using toliet, bedpan, or urinal?: A Little Help from another person bathing (including washing, rinsing, drying)?: A Little Help from another person to put on and taking off regular upper body clothing?: None Help from another person to put on and taking off regular lower body clothing?: A Little 6 Click Score: 21   End of Session Equipment Utilized During Treatment: Gait belt;Rolling walker (2 wheels);Oxygen Nurse Communication: Other (comment) (O2 requirement)  Activity Tolerance: Patient tolerated treatment well Patient left: in chair;with call bell/phone within reach;with chair alarm set  OT Visit Diagnosis: Muscle weakness (generalized) (M62.81)                Time: 9069-9045 OT Time Calculation (min): 24 min Charges:  OT General Charges $OT Visit: 1 Visit OT Evaluation $  OT Eval Low Complexity: 1 Low OT Treatments $Self Care/Home Management : 8-22 mins  Rogers Clause, OT/L MSOT, 02/09/2024

## 2024-02-09 NOTE — Evaluation (Signed)
 Physical Therapy Evaluation Patient Details Name: Kerri Mccullough MRN: [id number] DOB: Apr 16, 1945 Today's Date: 02/09/2024  History of Present Illness  Kerri Mccullough is a 78 y.o. female with medical history significant of HTN, HLD, DM, GERD, depression, CAD, s/p of stent placement, squamous skin cancer, pancreatic mass, CKD-3b, anemia, obesity, who presents with SOB. Patient tested positive for influenza A  Clinical Impression  Patient seated in recliner beginning of session; alert and oriented, follows commands and agreeable to participation with session. At baseline, lives with son and his daughter in single-story home, ramp access to enter/exit. Ambulatory without assist device for household distances (endorses furniture cruising); does have access to RW, BSC if needed.  Denies fall history; no home O2. On eval, patient endorses generalized weakness throughout, but demonstrates no obvious, focal strength, ROM deficits.  Able to complete sit/stand, basic transfers and gait (50') with RW, cga/close sup.  Demonstrates reciprocal stepping pattern, slightly guarded with decreased cadence; increased turning radius, but no overt buckling or LOB. Does subjectively prefer use of RW at this time for energy conservation and overall safety. Mild/mod SOB with gait efforts; brief desat to 89% on RA, but recovers >90% within 15-20 seconds of seated rest and maintained >93% remainder of session.  Left on RA end of session; RN/MD informed/aware. Would benefit from skilled PT to address above deficits and promote optimal return to PLOF.; recommend post-acute PT follow up as indicated by interdisciplinary care team.          If plan is discharge home, recommend the following: A little help with walking and/or transfers;A little help with bathing/dressing/bathroom   Can travel by private vehicle        Equipment Recommendations Rolling walker (2 wheels)  Recommendations for Other Services        Functional Status Assessment Patient has had a recent decline in their functional status and demonstrates the ability to make significant improvements in function in a reasonable and predictable amount of time.     Precautions / Restrictions Precautions Precautions: Fall Recall of Precautions/Restrictions: Intact Restrictions Weight Bearing Restrictions Per Provider Order: No      Mobility  Bed Mobility               General bed mobility comments: seated in recliner beginning/end of treatment session    Transfers Overall transfer level: Needs assistance Equipment used: Rolling walker (2 wheels) Transfers: Sit to/from Stand Sit to Stand: Contact guard assist, Supervision                Ambulation/Gait Ambulation/Gait assistance: Contact guard assist, Supervision Gait Distance (Feet): 50 Feet Assistive device: Rolling walker (2 wheels)         General Gait Details: reciprocal stepping pattern, slightly guarded with decreased cadence; increased turning radius, but no overt buckling or LOB.  Does subjectively prefer use of RW at this time for energy conservation and overall safety.  Mild/mod SOB with gait efforts; brief desat to 89% on RA, but recovers >90% within 15-20 seconds of seated rest.  Stairs            Wheelchair Mobility     Tilt Bed    Modified Rankin (Stroke Patients Only)       Balance Overall balance assessment: Needs assistance Sitting-balance support: No upper extremity supported, Feet supported Sitting balance-Leahy Scale: Good     Standing balance support: Bilateral upper extremity supported Standing balance-Leahy Scale: Fair  Pertinent Vitals/Pain Pain Assessment Pain Assessment: No/denies pain    Home Living Family/patient expects to be discharged to:: Private residence Living Arrangements: Other relatives Available Help at Discharge: Available 24 hours/day;Family Type of  Home: House Home Access: Ramped entrance       Home Layout: One level Home Equipment: Agricultural Consultant (2 wheels);BSC/3in1;Shower seat;Grab bars - tub/shower      Prior Function Prior Level of Function : Independent/Modified Independent             Mobility Comments: does not use assistive device but reports everything is close enough that I can hold onto things while I walk around.  Denies fall history. No home O2. ADLs Comments: independent     Extremity/Trunk Assessment   Upper Extremity Assessment Upper Extremity Assessment: Generalized weakness    Lower Extremity Assessment Lower Extremity Assessment: Generalized weakness (grossly 4/5 throughout)       Communication   Communication Communication: No apparent difficulties    Cognition Arousal: Alert Behavior During Therapy: WFL for tasks assessed/performed   PT - Cognitive impairments: No apparent impairments                         Following commands: Intact       Cueing Cueing Techniques: Verbal cues     General Comments General comments (skin integrity, edema, etc.): patient on 4L at start of O2, O2 96%, turned O2 to 2L and she remained above 92-93% during remainer of eval    Exercises     Assessment/Plan    PT Assessment Patient needs continued PT services  PT Problem List Decreased activity tolerance;Decreased balance;Decreased mobility;Decreased knowledge of use of DME;Decreased safety awareness;Decreased knowledge of precautions;Cardiopulmonary status limiting activity       PT Treatment Interventions DME instruction;Gait training;Therapeutic activities;Functional mobility training;Therapeutic exercise;Balance training;Patient/family education    PT Goals (Current goals can be found in the Care Plan section)  Acute Rehab PT Goals Patient Stated Goal: to go home PT Goal Formulation: With patient Time For Goal Achievement: 02/23/24 Potential to Achieve Goals: Good    Frequency  Min 2X/week     Co-evaluation               AM-PAC PT 6 Clicks Mobility  Outcome Measure Help needed turning from your back to your side while in a flat bed without using bedrails?: None Help needed moving from lying on your back to sitting on the side of a flat bed without using bedrails?: None Help needed moving to and from a bed to a chair (including a wheelchair)?: A Little Help needed standing up from a chair using your arms (e.g., wheelchair or bedside chair)?: A Little Help needed to walk in hospital room?: A Little Help needed climbing 3-5 steps with a railing? : A Little 6 Click Score: 20    End of Session   Activity Tolerance: Patient tolerated treatment well Patient left: in chair;with call bell/phone within reach;with chair alarm set Nurse Communication: Mobility status PT Visit Diagnosis: Muscle weakness (generalized) (M62.81);Difficulty in walking, not elsewhere classified (R26.2)    Time: 8851-8794 PT Time Calculation (min) (ACUTE ONLY): 17 min   Charges:   PT Evaluation $PT Eval Low Complexity: 1 Low   PT General Charges $$ ACUTE PT VISIT: 1 Visit         Venetta Knee H. Delores, PT, DPT, NCS 02/09/2024, 1:27 PM [phone]

## 2024-02-10 DIAGNOSIS — J101 Influenza due to other identified influenza virus with other respiratory manifestations: Secondary | ICD-10-CM | POA: Diagnosis not present

## 2024-02-10 LAB — TYPE AND SCREEN
ABO/RH(D): O POS
Antibody Screen: NEGATIVE
Unit division: 0

## 2024-02-10 LAB — CBC WITH DIFFERENTIAL/PLATELET
Abs Immature Granulocytes: 0.04 K/uL (ref 0.00–0.07)
Basophils Absolute: 0 K/uL (ref 0.0–0.1)
Basophils Relative: 0 %
Eosinophils Absolute: 0 K/uL (ref 0.0–0.5)
Eosinophils Relative: 0 %
HCT: 27.6 % — ABNORMAL LOW (ref 36.0–46.0)
Hemoglobin: 8.6 g/dL — ABNORMAL LOW (ref 12.0–15.0)
Immature Granulocytes: 0 %
Lymphocytes Relative: 8 %
Lymphs Abs: 0.7 K/uL (ref 0.7–4.0)
MCH: 25.8 pg — ABNORMAL LOW (ref 26.0–34.0)
MCHC: 31.2 g/dL (ref 30.0–36.0)
MCV: 82.9 fL (ref 80.0–100.0)
Monocytes Absolute: 1.3 K/uL — ABNORMAL HIGH (ref 0.1–1.0)
Monocytes Relative: 15 %
Neutro Abs: 6.9 K/uL (ref 1.7–7.7)
Neutrophils Relative %: 77 %
Platelets: 211 K/uL (ref 150–400)
RBC: 3.33 MIL/uL — ABNORMAL LOW (ref 3.87–5.11)
RDW: 16.4 % — ABNORMAL HIGH (ref 11.5–15.5)
WBC: 8.9 K/uL (ref 4.0–10.5)
nRBC: 0 % (ref 0.0–0.2)

## 2024-02-10 LAB — BASIC METABOLIC PANEL WITH GFR
Anion gap: 12 (ref 5–15)
BUN: 27 mg/dL — ABNORMAL HIGH (ref 8–23)
CO2: 26 mmol/L (ref 22–32)
Calcium: 8.5 mg/dL — ABNORMAL LOW (ref 8.9–10.3)
Chloride: 97 mmol/L — ABNORMAL LOW (ref 98–111)
Creatinine, Ser: 1.61 mg/dL — ABNORMAL HIGH (ref 0.44–1.00)
GFR, Estimated: 33 mL/min — ABNORMAL LOW
Glucose, Bld: 162 mg/dL — ABNORMAL HIGH (ref 70–99)
Potassium: 4.3 mmol/L (ref 3.5–5.1)
Sodium: 134 mmol/L — ABNORMAL LOW (ref 135–145)

## 2024-02-10 LAB — GLUCOSE, CAPILLARY
Glucose-Capillary: 165 mg/dL — ABNORMAL HIGH (ref 70–99)
Glucose-Capillary: 203 mg/dL — ABNORMAL HIGH (ref 70–99)
Glucose-Capillary: 450 mg/dL — ABNORMAL HIGH (ref 70–99)
Glucose-Capillary: 170 mg/dL — ABNORMAL HIGH (ref 70–99)

## 2024-02-10 LAB — BPAM RBC
Blood Product Expiration Date: 202601262359
ISSUE DATE / TIME: 202512261434
Unit Type and Rh: 5100

## 2024-02-10 MED ORDER — FUROSEMIDE 10 MG/ML IJ SOLN
40.0000 mg | Freq: Every day | INTRAMUSCULAR | Status: DC
  Administered 2024-02-11: 40 mg via INTRAVENOUS
  Filled 2024-02-10: qty 4

## 2024-02-10 MED ORDER — AMLODIPINE BESYLATE 10 MG PO TABS
10.0000 mg | ORAL_TABLET | Freq: Two times a day (BID) | ORAL | Status: DC
  Administered 2024-02-10 – 2024-02-11 (×2): 10 mg via ORAL
  Filled 2024-02-10 (×2): qty 1

## 2024-02-10 MED ORDER — PREDNISONE 20 MG PO TABS
40.0000 mg | ORAL_TABLET | Freq: Every day | ORAL | Status: DC
  Administered 2024-02-11: 40 mg via ORAL
  Filled 2024-02-10: qty 2

## 2024-02-10 NOTE — Plan of Care (Signed)

## 2024-02-10 NOTE — Progress Notes (Signed)
 Mobility Specialist - Progress Note   02/10/24 1200  Mobility  Activity Ambulated with assistance;Stood at bedside  Level of Assistance Independent after set-up  Assistive Device None  Distance Ambulated (ft) 25 ft  Range of Motion/Exercises Active  Activity Response Tolerated well  Mobility visit 1 Mobility  Mobility Specialist Start Time (ACUTE ONLY) 0941  Mobility Specialist Stop Time (ACUTE ONLY) 0946  Mobility Specialist Time Calculation (min) (ACUTE ONLY) 5 min   Pt was at the EOB on RA upon entry. Pt agreed to mobility. Pt is able today to STS independently with no AD. Pt ambulated well. After activity Physician entered the room. Pt is in the recliner upon exit with physician in room.  Clem Rodes Mobility Specialist 02/10/2024, 12:13 PM

## 2024-02-10 NOTE — Progress Notes (Signed)
 " Progress Note   Patient: Kerri Mccullough FMW:[id number] DOB: 1945-11-14 DOA: 02/08/2024     2 DOS: the patient was seen and examined on 02/10/2024     Brief hospital course:  Kerri Mccullough is a 78 y.o. female with medical history significant of HTN, HLD, DM, GERD, depression, CAD, s/p of stent placement, squamous skin cancer, pancreatic mass, CKD-3b, anemia, obesity, who presents with SOB. Patient was found to have positive flu A PCR, BNP 1115. Patient therefore admitted for acute respiratory failure due to influenza A as well as CHF exacerbation   Assessment and Plan:  Influenza A: CXR showed diffuse bronchial thickening. Continue to wean off oxygen as tolerated Solu-Medrol  have been switched to prednisone  - Bronchodilators and as needed Mucinex  - Incentive spirometry - Tamiflu  30 mg twice daily   Acute heart failure with preserved ejection fraction. Patient has leg edema, elevated BNP 1115, indicating possible acute CHF.   Echo obtained on 1225 showing EF 55 to 60% with grade 2 diastolic dysfunction IV Lasix  has been decreased from twice daily to daily given renal function Monitor input and output closely as well as daily weight We will introduce other heart failure medication as blood pressure allows   Acute respiratory failure with hypoxia (HCC): Has 4L new oxygen requirement.  Due to combination for flu A infection and acute CHF. Continue management as above   Myocardial injury and history of CAD: S/p of stent placement.  Troponin 21 -> 20.  Likely demand ischemia. - Lipitor - Patient is allergic to aspirin    Hypertension Continue Coreg  and amlodipine  - IV hydralazine  as needed   Type II diabetes mellitus with renal manifestations (HCC): With hyperglycemia  A1c 8.5 on 2023, no recent A1c data available.  Patient is taking metformin , Ozempic and Trulicity at home. Continue current insulin  therapy A1c 7.7  HLD (hyperlipidemia) -Lipitor   Chronic kidney  disease, stage 3b [facility]): Renal function stable.  Baseline creatinine 1.50 09/23/2023.  Her creatinine 1.37, BUN 16, GFR 40. Renal function slightly worse today with creatinine 1.6 We have decreased IV Lasix  from 40 mg twice daily to daily Continue to monitor renal function closely   Normocytic anemia: Hemoglobin 10.1 on 09/23/2023 --> 8.3.  Patient denies rectal bleeding or dark stool.  Patient has history of iron deficiency anemia.   Anemia panel showing possible iron deficiency anemia Initiated on ferrous sulfate    Depression Continue Celexa    Chronic L1 and L2 compression fracture Patient able to work with physical therapy with no pain   Overweight-BMI 29.7 Consider weight loss when medically stable   DVT ppx: SQ Lovenox    Code Status: DNR    Family Communication:   No family present at bedside   Disposition Plan:  Anticipate discharge back to previous environment   Consults called:  none   Subjective:  Patient seen and examined at bedside this morning She admits to improvement in respiratory function Denies nausea vomiting chest pain cough     Physical Exam: General: Elderly female laying in bed on intranasal oxygen Cardiac: S1/S2, RRR, No gallops or rubs. Respiratory: Has loud coarse breath sound bilaterally GI: Soft, nondistended, nontender, GU: No hematuria Ext: Bilateral lower extremity pitting edema noted Musculoskeletal: No joint deformities, No joint redness or warmth, no limitation of ROM in spin. Skin: No rashes.  Neuro: Alert, oriented X3, cranial nerves II-XII grossly intact, moves all extremities normally. Psych: Patient is not psychotic, no suicidal or hemocidal ideation.   Data Reviewed: I have reviewed  patient chest x-ray as well as CT angio that did not show any acute P  Vitals:   02/10/24 0500 02/10/24 0801 02/10/24 1145 02/10/24 1644  BP:  (!) 150/72 (!) 159/69 (!) 154/65  Pulse:  80 64 71  Resp:  19 17   Temp:  98.3 F (36.8 C) 98.2 F (36.8  C) 98.7 F (37.1 C)  TempSrc:  Oral Oral   SpO2:  95% 94% 95%  Weight: 78.6 kg     Height:          Latest Ref Rng & Units 02/10/2024    4:26 AM 02/09/2024    5:01 AM 02/08/2024    6:33 PM  CBC  WBC 4.0 - 10.5 K/uL 8.9  4.9  8.4   Hemoglobin 12.0 - 15.0 g/dL 8.6  7.5  8.3   Hematocrit 36.0 - 46.0 % 27.6  24.6  27.6   Platelets 150 - 400 K/uL 211  189  238        Latest Ref Rng & Units 02/10/2024    4:26 AM 02/09/2024    5:01 AM 02/08/2024    6:33 PM  BMP  Glucose 70 - 99 mg/dL 837  671  835   BUN 8 - 23 mg/dL 27  19  16    Creatinine 0.44 - 1.00 mg/dL 8.38  8.49  8.62   Sodium 135 - 145 mmol/L 134  133  135   Potassium 3.5 - 5.1 mmol/L 4.3  4.4  4.3   Chloride 98 - 111 mmol/L 97  99  100   CO2 22 - 32 mmol/L 26  22  22    Calcium  8.9 - 10.3 mg/dL 8.5  8.4  8.7      Author: Drue ONEIDA Potter, MD 02/10/2024 4:54 PM  For on call review [email or web address].  "

## 2024-02-11 ENCOUNTER — Other Ambulatory Visit: Payer: Self-pay

## 2024-02-11 LAB — CBC WITH DIFFERENTIAL/PLATELET
Abs Immature Granulocytes: 0.06 K/uL (ref 0.00–0.07)
Basophils Absolute: 0 K/uL (ref 0.0–0.1)
Basophils Relative: 0 %
Eosinophils Absolute: 0 K/uL (ref 0.0–0.5)
Eosinophils Relative: 0 %
HCT: 29.5 % — ABNORMAL LOW (ref 36.0–46.0)
Hemoglobin: 9.2 g/dL — ABNORMAL LOW (ref 12.0–15.0)
Immature Granulocytes: 1 %
Lymphocytes Relative: 14 %
Lymphs Abs: 1.3 K/uL (ref 0.7–4.0)
MCH: 26.1 pg (ref 26.0–34.0)
MCHC: 31.2 g/dL (ref 30.0–36.0)
MCV: 83.8 fL (ref 80.0–100.0)
Monocytes Absolute: 1.2 K/uL — ABNORMAL HIGH (ref 0.1–1.0)
Monocytes Relative: 13 %
Neutro Abs: 6.6 K/uL (ref 1.7–7.7)
Neutrophils Relative %: 72 %
Platelets: 250 K/uL (ref 150–400)
RBC: 3.52 MIL/uL — ABNORMAL LOW (ref 3.87–5.11)
RDW: 16.3 % — ABNORMAL HIGH (ref 11.5–15.5)
WBC: 9.2 K/uL (ref 4.0–10.5)
nRBC: 0 % (ref 0.0–0.2)

## 2024-02-11 LAB — BASIC METABOLIC PANEL WITH GFR
Anion gap: 11 (ref 5–15)
BUN: 30 mg/dL — ABNORMAL HIGH (ref 8–23)
CO2: 27 mmol/L (ref 22–32)
Calcium: 8.7 mg/dL — ABNORMAL LOW (ref 8.9–10.3)
Chloride: 97 mmol/L — ABNORMAL LOW (ref 98–111)
Creatinine, Ser: 1.43 mg/dL — ABNORMAL HIGH (ref 0.44–1.00)
GFR, Estimated: 38 mL/min — ABNORMAL LOW
Glucose, Bld: 151 mg/dL — ABNORMAL HIGH (ref 70–99)
Potassium: 4.2 mmol/L (ref 3.5–5.1)
Sodium: 135 mmol/L (ref 135–145)

## 2024-02-11 LAB — GLUCOSE, CAPILLARY
Glucose-Capillary: 127 mg/dL — ABNORMAL HIGH (ref 70–99)
Glucose-Capillary: 166 mg/dL — ABNORMAL HIGH (ref 70–99)

## 2024-02-11 MED ORDER — PREDNISONE 20 MG PO TABS
40.0000 mg | ORAL_TABLET | Freq: Every day | ORAL | 0 refills | Status: AC
  Filled 2024-02-11: qty 8, 4d supply, fill #0

## 2024-02-11 MED ORDER — OSELTAMIVIR PHOSPHATE 30 MG PO CAPS: 30.0000 mg | ORAL_CAPSULE | Freq: Two times a day (BID) | ORAL | 0 refills | Status: AC

## 2024-02-11 MED ORDER — FERROUS SULFATE 325 (65 FE) MG PO TABS
325.0000 mg | ORAL_TABLET | Freq: Every day | ORAL | 0 refills | Status: AC
  Filled 2024-02-11: qty 30, 30d supply, fill #0

## 2024-02-11 MED ORDER — OSELTAMIVIR PHOSPHATE 30 MG PO CAPS
30.0000 mg | ORAL_CAPSULE | Freq: Two times a day (BID) | ORAL | 0 refills | Status: DC
  Filled 2024-02-11: qty 4, 2d supply, fill #0

## 2024-02-11 MED ORDER — FUROSEMIDE 20 MG PO TABS
20.0000 mg | ORAL_TABLET | Freq: Every day | ORAL | 0 refills | Status: AC
  Filled 2024-02-11: qty 30, 30d supply, fill #0

## 2024-02-11 NOTE — TOC Initial Note (Signed)
 Transition of Care [facility]) - Initial/Assessment Note    Patient Details  Name: Kerri Mccullough MRN: [id number] Date of Birth: 06/06/1945  Transition of Care [facility]) CM/SW Contact:    Latash Nouri L Milica Gully, LCSW Phone Number: 02/11/2024, 10:06 AM  Clinical Narrative:                  Chart reviewed. Home Health recommended for PT and OT. Requests sent in the portal.        Patient Goals and CMS Choice            Expected Discharge Plan and Services                                              Prior Living Arrangements/Services                       Activities of Daily Living      Permission Sought/Granted                  Emotional Assessment              Admission diagnosis:  Influenza A [J10.1] Hypoxia [R09.02] Dyspnea, unspecified type [R06.00] Acute on chronic congestive heart failure, unspecified heart failure type (HCC) [I50.9] Patient Active Problem List   Diagnosis Date Noted   Influenza A 02/08/2024   Acute CHF (HCC) 02/08/2024   Type II diabetes mellitus with renal manifestations (HCC) 02/08/2024   Chronic kidney disease, stage 3b (HCC) 02/08/2024   Obesity (BMI 30-39.9) 02/08/2024   HLD (hyperlipidemia) 02/08/2024   Myocardial injury 02/08/2024   Normocytic anemia 02/08/2024   Depression 02/08/2024   Pain in limb 12/06/2022   Lower back pain 07/30/2021   Compression fracture of lumbar vertebra (HCC) 07/30/2021   Lower abdominal pain 07/30/2021   Hypertension    Diabetes mellitus without complication (HCC)    Nausea & vomiting    Intractable low back pain    Intractable pain 07/18/2021   Pancreatic mass 07/18/2021   Acute respiratory failure with hypoxia (HCC) 07/18/2021   Epigastric pain 09/11/2018   Chest pain 05/05/2018   Constipation 05/25/2017   Urinary incontinence 05/25/2017   No diabetic retinopathy in both eyes 02/10/2017   Age-related osteoporosis with current pathological fracture 09/18/2016   Foot  pain, right 09/18/2016   Closed compression fracture of L1 lumbar vertebra, sequela 09/13/2016   Closed fracture of medial malleolus 06/02/2016   Falls 12/16/2015   GERD (gastroesophageal reflux disease) 05/22/2014   Pulmonary nodules 05/22/2014   CAD (coronary artery disease) 06/02/2012   Depressive disorder 12/28/2009   Hyperlipidemia 12/28/2009   Diabetes mellitus (HCC) 06/10/2009   Hypertension, benign 06/10/2009   PCP:  Autry Grayce LABOR, PA Pharmacy:   [facility] [address] ([address]),  - [address] [place] ([place]) [place] [address] Phone: [phone] Fax: [phone]  [facility] - [place], [place] - [address] [place]  [address] Phone: [phone] Fax: [phone]  [facility] [address] [place] - [address] [place] [address] Phone: [phone] Fax: [phone]     Social Drivers of Health (SDOH) Social History: SDOH Screenings   Food Insecurity: No Food Insecurity (02/09/2024)  Housing: Low Risk (02/09/2024)  Transportation Needs: No Transportation Needs (02/09/2024)  Utilities: Not At Risk (02/09/2024)  Social Connections: Moderately Isolated (02/09/2024)  Tobacco Use: Medium Risk (02/08/2024)   SDOH Interventions:     Readmission Risk Interventions     No data to display

## 2024-02-11 NOTE — Progress Notes (Signed)
 Mobility Specialist - Progress Note    02/11/24 1100  Mobility  Activity Ambulated with assistance;Stood at bedside;Respositioned in chair  Level of Assistance Independent after set-up  Assistive Device None  Distance Ambulated (ft) 30 ft  Range of Motion/Exercises Active  Activity Response Tolerated well  Mobility visit 1 Mobility  Mobility Specialist Start Time (ACUTE ONLY) 1055  Mobility Specialist Stop Time (ACUTE ONLY) 1105  Mobility Specialist Time Calculation (min) (ACUTE ONLY) 10 min   Pt was in the recliner on RA upon entry. Pt agreed to mobility. Pt is able today to STS independently with no AD. Pt ambulated well within the room with no AD. Pt did not need recovery break throughout activity. Pt O2 vitals remained WNL. After activity pt repositioned in the recliner with needs in reach.  Clem Rodes Mobility Specialist 02/11/2024, 11:29 AM

## 2024-02-11 NOTE — Discharge Summary (Signed)
 Physician Discharge Summary  Kerri Mccullough FMW:[id number] DOB: [birth year] DOA: 02/08/2024  PCP: Autry Grayce LABOR, PA  Admit date: 02/08/2024 Discharge date: 02/11/2024  Admitted From: Home Disposition:  Home with home health  Recommendations for Outpatient Follow-up:  Follow up with PCP in 1-2 weeks   Home Health: Yes PT and OT Equipment/Devices: None  Discharge Condition: Stable CODE STATUS: DNR Diet recommendation: Regular  Brief/Interim Summary:   Kerri Mccullough is a 78 y.o. female with medical history significant of HTN, HLD, DM, GERD, depression, CAD, s/p of stent placement, squamous skin cancer, pancreatic mass, CKD-3b, anemia, obesity, who presents with SOB. Patient was found to have positive flu A PCR, BNP 1115. Patient therefore admitted for acute respiratory failure due to influenza A as well as CHF exacerbation      Discharge Diagnoses:  Principal Problem:   Influenza A Active Problems:   Acute CHF (HCC)   Acute respiratory failure with hypoxia (HCC)   Myocardial injury   Hypertension   CAD (coronary artery disease)   Type II diabetes mellitus with renal manifestations (HCC)   HLD (hyperlipidemia)   Chronic kidney disease, stage 3b (HCC)   Normocytic anemia   Depression   Obesity (BMI 30-39.9)  Influenza A Complete prednisone  course.  Complete Tamiflu  course.  Stable for discharge home.  Acute heart failure with preserved ejection fraction. Patient has some peripheral edema still present at time of discharge.  Will recommend Lasix  20 mg daily and follow-up outpatient with cardiology   Acute respiratory failure with hypoxia [facility]): Has 4L new oxygen requirement.  Due to combination for flu A infection and acute CHF. Off oxygen on room air at time of discharge   Discharge Instructions  Discharge Instructions     Increase activity slowly   Complete by: As directed       Allergies as of 02/11/2024       Reactions   Ace Inhibitors  Swelling   Rosiglitazone Other (See Comments)   Other Reaction: SOB, Pulmonary Edema Other Reaction: SOB, Pulmonary Edema Other Reaction: SOB, Pulmonary Edema   Aspirin  Swelling   When taken with a beta blocker    Golytely [peg 3350-kcl-nabcb-nacl-nasulf] Swelling        Medication List     STOP taking these medications    Myrbetriq  50 MG Tb24 tablet Generic drug: mirabegron  ER       TAKE these medications    amLODipine  5 MG tablet Commonly known as: NORVASC  Take 5 mg by mouth 2 (two) times daily.   aspirin  EC 81 MG tablet Take 81 mg by mouth daily.   atorvastatin  20 MG tablet Commonly known as: LIPITOR Take 20 mg by mouth daily.   carvedilol  6.25 MG tablet Commonly known as: COREG  Take 1 tablet (6.25 mg total) by mouth 2 (two) times daily with a meal.   citalopram  20 MG tablet Commonly known as: CELEXA  Take 20 mg by mouth daily.   estradiol  0.1 MG/GM vaginal cream Commonly known as: ESTRACE  VAGINAL Estrogen Cream Instruction Discard applicator Apply pea sized amount to tip of finger to urethra before bed. Wash hands well after application. Use Monday, Wednesday and Friday   FeroSul 325 (65 Fe) MG tablet Generic drug: ferrous sulfate  Take 1 tablet (325 mg total) by mouth daily with breakfast.   furosemide  20 MG tablet Commonly known as: Lasix  Take 1 tablet (20 mg total) by mouth daily.   metFORMIN  1000 MG tablet Commonly known as: GLUCOPHAGE  Take 1 tablet by mouth 2 (  two) times daily.   nitrofurantoin  100 MG capsule Commonly known as: Macrodantin  Take 1 capsule (100 mg total) by mouth daily.   omeprazole 40 MG capsule Commonly known as: PRILOSEC Take 40 mg by mouth daily.   oseltamivir  30 MG capsule Commonly known as: TAMIFLU  Take 1 capsule (30 mg total) by mouth 2 (two) times daily for 2 days.   predniSONE  20 MG tablet Commonly known as: DELTASONE  Take 2 tablets (40 mg total) by mouth daily with breakfast for 4 days. Start taking on:  February 12, 2024   Trulicity 0.75 MG/0.5ML Soaj Generic drug: Dulaglutide Inject 0.75 mg into the skin once a week.        Contact information for after-discharge care     Home Medical Care     [facility]) .   Service: Home Health Services                    Allergies[1]  Consultations: None   Procedures/Studies: ECHOCARDIOGRAM COMPLETE Result Date: 02/09/2024    ECHOCARDIOGRAM REPORT   Patient Name:   Kerri Mccullough Date of Exam: 02/09/2024 Medical Rec #:  [id number]          Height:       64.0 in Accession #:    [id number]         Weight:       173.5 lb Date of Birth:  1945/08/07           BSA:          1.842 m Patient Age:    78 years           BP:           142/59 mmHg Patient Gender: F                  HR:           84 bpm. Exam Location:  [facility] Procedure: 2D Echo, Cardiac Doppler and Color Doppler (Both Spectral and Color            Flow Doppler were utilized during procedure). Indications:     CHF-Acute Diastolic I50.31  History:         Patient has no prior history of Echocardiogram examinations.                  CHF.  Sonographer:     Ashley McNeely-Sloane Referring Phys:  [id number] XILIN NIU Diagnosing Phys: Cara JONETTA Lovelace MD IMPRESSIONS  1. Left ventricular ejection fraction, by estimation, is 55 to 60%. The left ventricle has normal function. The left ventricle has no regional wall motion abnormalities. There is mild left ventricular hypertrophy. Left ventricular diastolic parameters are consistent with Grade II diastolic dysfunction (pseudonormalization).  2. Right ventricular systolic function is normal. The right ventricular size is normal.  3. Left atrial size was mildly dilated.  4. The mitral valve is grossly normal. Mild mitral valve regurgitation.  5. The aortic valve is grossly normal. Aortic valve regurgitation is not visualized. FINDINGS  Left Ventricle: Left ventricular ejection fraction, by estimation, is 55 to 60%. The left  ventricle has normal function. The left ventricle has no regional wall motion abnormalities. Strain was performed and the global longitudinal strain is indeterminate. The left ventricular internal cavity size was normal in size. There is mild left ventricular hypertrophy. Left ventricular diastolic parameters are consistent with Grade II diastolic dysfunction (pseudonormalization). Right Ventricle: The right ventricular size is normal.  No increase in right ventricular wall thickness. Right ventricular systolic function is normal. Left Atrium: Left atrial size was mildly dilated. Right Atrium: Right atrial size was normal in size. Pericardium: There is no evidence of pericardial effusion. Mitral Valve: The mitral valve is grossly normal. Mild mitral valve regurgitation. MV peak gradient, 9.6 mmHg. The mean mitral valve gradient is 4.0 mmHg. Tricuspid Valve: The tricuspid valve is normal in structure. Tricuspid valve regurgitation is mild. Aortic Valve: The aortic valve is grossly normal. Aortic valve regurgitation is not visualized. Aortic valve mean gradient measures 6.0 mmHg. Aortic valve peak gradient measures 11.3 mmHg. Aortic valve area, by VTI measures 2.24 cm. Pulmonic Valve: The pulmonic valve was not well visualized. Pulmonic valve regurgitation is not visualized. Aorta: The ascending aorta was not well visualized. IAS/Shunts: No atrial level shunt detected by color flow Doppler. Additional Comments: 3D was performed not requiring image post processing on an independent workstation and was indeterminate.  LEFT VENTRICLE PLAX 2D LVIDd:         4.80 cm     Diastology LVIDs:         3.50 cm     LV e' medial:    8.38 cm/s LV PW:         1.00 cm     LV E/e' medial:  18.6 LV IVS:        1.10 cm     LV e' lateral:   11.40 cm/s LVOT diam:     2.00 cm     LV E/e' lateral: 13.7 LV SV:         82 LV SV Index:   45 LVOT Area:     3.14 cm LV IVRT:       74 msec  LV Volumes (MOD) LV vol d, MOD A2C: 56.8 ml LV vol d, MOD  A4C: 68.8 ml LV vol s, MOD A2C: 21.8 ml LV vol s, MOD A4C: 35.7 ml LV SV MOD A2C:     35.0 ml LV SV MOD A4C:     68.8 ml LV SV MOD BP:      38.5 ml RIGHT VENTRICLE             IVC RV Basal diam:  4.30 cm     IVC diam: 2.40 cm RV Mid diam:    2.90 cm RV S prime:     10.60 cm/s  PULMONARY VEINS TAPSE (M-mode): 2.6 cm      Diastolic Velocity: 106.00 cm/s                             S/D Velocity:       0.70                             Systolic Velocity:  69.10 cm/s LEFT ATRIUM             Index        RIGHT ATRIUM           Index LA diam:        4.00 cm 2.17 cm/m   RA Area:     13.80 cm LA Vol (A2C):   61.9 ml 33.61 ml/m  RA Volume:   30.40 ml  16.51 ml/m LA Vol (A4C):   89.1 ml 48.38 ml/m LA Biplane Vol: 77.6 ml 42.14 ml/m  AORTIC VALVE  PULMONIC VALVE AV Area (Vmax):    2.37 cm      PV Vmax:        1.15 m/s AV Area (Vmean):   2.29 cm      PV Vmean:       79.100 cm/s AV Area (VTI):     2.24 cm      PV VTI:         0.230 m AV Vmax:           168.00 cm/s   PV Peak grad:   5.3 mmHg AV Vmean:          117.000 cm/s  PV Mean grad:   3.0 mmHg AV VTI:            0.368 m       RVOT Peak grad: 3 mmHg AV Peak Grad:      11.3 mmHg AV Mean Grad:      6.0 mmHg LVOT Vmax:         127.00 cm/s LVOT Vmean:        85.200 cm/s LVOT VTI:          0.262 m LVOT/AV VTI ratio: 0.71  AORTA Ao Root diam: 2.10 cm Ao Asc diam:  2.50 cm MITRAL VALVE                TRICUSPID VALVE MV Area (PHT): 5.54 cm     TR Peak grad:   32.5 mmHg MV Area VTI:   2.15 cm     TR Mean grad:   22.0 mmHg MV Peak grad:  9.6 mmHg     TR Vmax:        285.00 cm/s MV Mean grad:  4.0 mmHg     TR Vmean:       223.0 cm/s MV Vmax:       1.55 m/s MV Vmean:      95.7 cm/s    SHUNTS MV Decel Time: 137 msec     Systemic VTI:  0.26 m MV E velocity: 156.00 cm/s  Systemic Diam: 2.00 cm MV A velocity: 95.90 cm/s   Pulmonic VTI:  0.134 m MV E/A ratio:  1.63 Dwayne D Callwood MD Electronically signed by Cara JONETTA Lovelace MD Signature Date/Time:  02/09/2024/1:24:02 PM    Final    CT Angio Chest PE W/Cm &/Or Wo Cm Result Date: 02/08/2024 CLINICAL DATA:  Shortness of breath EXAM: CT ANGIOGRAPHY CHEST WITH CONTRAST TECHNIQUE: Multidetector CT imaging of the chest was performed using the standard protocol during bolus administration of intravenous contrast. Multiplanar CT image reconstructions and MIPs were obtained to evaluate the vascular anatomy. RADIATION DOSE REDUCTION: This exam was performed according to the departmental dose-optimization program which includes automated exposure control, adjustment of the mA and/or kV according to patient size and/or use of iterative reconstruction technique. CONTRAST:  75mL OMNIPAQUE  IOHEXOL  350 MG/ML SOLN COMPARISON:  Chest x-ray 02/08/2024, chest CT 09/10/2019 FINDINGS: Cardiovascular: Satisfactory opacification of the pulmonary arteries to the segmental level. No evidence of pulmonary embolism. Advanced aortic atherosclerosis. No aneurysm or dissection. Multi-vessel coronary vascular calcification. Cardiomegaly. No pericardial effusion. Mediastinum/Nodes: Patent trachea. No thyroid  mass. AP window lymph nodes measuring up to 10 mm. Small right hilar nodes measuring up to 16 mm. Moderate hiatal hernia. Lungs/Pleura: Mild diffuse subpleural reticulation. Mild diffuse septal thickening. Small left and small moderate right pleural effusions. Passive atelectasis in the lower lobes. Stable bilateral pulmonary nodules compared to prior, largest is in the left lower lobe and  measures 7 mm, series 5, image 80, previously 7 mm. 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 Review of the MIP images confirms the above findings. IMPRESSION: 1. Negative for acute pulmonary embolus. 2. Cardiomegaly with small left and small moderate right pleural effusions. Mild diffuse septal thickening  suggesting interstitial edema. 3. Stable bilateral pulmonary nodules measuring up to 7 mm. These are stable since 2021, no further follow-up imaging recommended 4. 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 5. Aortic atherosclerosis. Aortic Atherosclerosis (ICD10-I70.0). Electronically Signed   By: Luke Bun M.D.   On: 02/08/2024 20:50   US  Venous Img Lower  Left (DVT Study) Result Date: 02/08/2024 EXAM: ULTRASOUND DUPLEX OF THE LEFT LOWER EXTREMITY VEINS TECHNIQUE: Duplex ultrasound using B-mode/gray scaled imaging and Doppler spectral analysis and color flow was obtained of the deep venous structures of the left lower extremity. COMPARISON: None available. CLINICAL HISTORY: L swelling FINDINGS: The common femoral vein, femoral vein, popliteal vein, and posterior tibial vein demonstrate normal compressibility with normal color flow and spectral analysis. IMPRESSION: 1. No evidence of DVT. Electronically signed by: Norman Gatlin MD 02/08/2024 08:38 PM EST RP Workstation: [id number]   DG Chest 2 View Result Date: 02/08/2024 CLINICAL DATA:  Chest pain and shortness of breath. EXAM: DG CHEST 2V COMPARISON:  None Available. FINDINGS: Heart is enlarged. Aortic atherosclerosis. Retrocardiac hiatal hernia. Diffuse bronchial thickening. No focal airspace disease. No pneumothorax or pleural effusion. Left shoulder arthropathy. IMPRESSION: 1. Cardiomegaly. 2. Diffuse bronchial thickening. 3. Hiatal hernia. Electronically Signed   By: Andrea Gasman M.D.   On: 02/08/2024 18:57      Subjective: Seen and examined on the day of discharge.  Stable no distress.  Appropriate discharge home.  Discharge Exam: Vitals:   02/11/24 0829 02/11/24 1155  BP: (!) 153/65 (!) 147/67  Pulse: 66 (!) 56  Resp: 19 16  Temp: 98 F (36.7 C) 97.9 F (36.6 C)  SpO2: 100% 96%   Vitals:   02/11/24 0446 02/11/24 0823  02/11/24 0829 02/11/24 1155  BP:   (!) 153/65 (!) 147/67  Pulse:   66 (!) 56  Resp:   19 16  Temp:   98 F (36.7 C) 97.9 F (36.6 C)  TempSrc:    Oral  SpO2:  95% 100% 96%  Weight: 78.5 kg     Height:        General: Pt is alert, awake, not in acute distress Cardiovascular: RRR, S1/S2 +, no rubs, no gallops Respiratory: CTA bilaterally, no wheezing, no rhonchi Abdominal: Soft, NT, ND, bowel sounds + Extremities: no edema, no cyanosis    The results of significant diagnostics from this hospitalization (including imaging, microbiology, ancillary and laboratory) are listed below for reference.     Microbiology: Recent Results (from the past 240 hours)  Resp panel by RT-PCR (RSV, Flu A&B, Covid) Anterior Nasal Swab     Status: Abnormal   Collection Time: 02/08/24  8:00 PM   Specimen: Anterior Nasal Swab  Result Value Ref Range Status   SARS Coronavirus 2 by RT PCR NEGATIVE NEGATIVE Final    Comment: (NOTE) SARS-CoV-2 target nucleic acids are NOT DETECTED.  The SARS-CoV-2 RNA is generally detectable in upper respiratory specimens during the acute phase of infection. The lowest concentration of SARS-CoV-2 viral copies this assay can  detect is 138 copies/mL. A negative result does not preclude SARS-Cov-2 infection and should not be used as the sole basis for treatment or other patient management decisions. A negative result may occur with  improper specimen collection/handling, submission of specimen other than nasopharyngeal swab, presence of viral mutation(s) within the areas targeted by this assay, and inadequate number of viral copies(<138 copies/mL). A negative result must be combined with clinical observations, patient history, and epidemiological information. The expected result is Negative.  Fact Sheet for Patients:  bloggercourse.com  Fact Sheet for Healthcare Providers:  seriousbroker.it  This test is no t yet  approved or cleared by the [place]  FDA and  has been authorized for detection and/or diagnosis of SARS-CoV-2 by FDA under an Emergency Use Authorization (EUA). This EUA will remain  in effect (meaning this test can be used) for the duration of the COVID-19 declaration under Section 564(b)(1) of the Act, 21 U.S.C.section 360bbb-3(b)(1), unless the authorization is terminated  or revoked sooner.       Influenza A by PCR POSITIVE (A) NEGATIVE Final   Influenza B by PCR NEGATIVE NEGATIVE Final    Comment: (NOTE) The Xpert Xpress SARS-CoV-2/FLU/RSV plus assay is intended as an aid in the diagnosis of influenza from Nasopharyngeal swab specimens and should not be used as a sole basis for treatment. Nasal washings and aspirates are unacceptable for Xpert Xpress SARS-CoV-2/FLU/RSV testing.  Fact Sheet for Patients: bloggercourse.com  Fact Sheet for Healthcare Providers: seriousbroker.it  This test is not yet approved or cleared by the [place]  FDA and has been authorized for detection and/or diagnosis of SARS-CoV-2 by FDA under an Emergency Use Authorization (EUA). This EUA will remain in effect (meaning this test can be used) for the duration of the COVID-19 declaration under Section 564(b)(1) of the Act, 21 U.S.C. section 360bbb-3(b)(1), unless the authorization is terminated or revoked.     Resp Syncytial Virus by PCR NEGATIVE NEGATIVE Final    Comment: (NOTE) Fact Sheet for Patients: bloggercourse.com  Fact Sheet for Healthcare Providers: seriousbroker.it  This test is not yet approved or cleared by the [place]  FDA and has been authorized for detection and/or diagnosis of SARS-CoV-2 by FDA under an Emergency Use Authorization (EUA). This EUA will remain in effect (meaning this test can be used) for the duration of the COVID-19 declaration under Section 564(b)(1)  of the Act, 21 U.S.C. section 360bbb-3(b)(1), unless the authorization is terminated or revoked.  Performed at [facility], [address] [place], [place] [address]      Labs: BNP (last 3 results) No results for input(s): BNP in the last 8760 hours. Basic Metabolic Panel: Recent Labs  Lab 02/08/24 1833 02/09/24 0501 02/10/24 0426 02/11/24 0357  NA 135 133* 134* 135  K 4.3 4.4 4.3 4.2  CL 100 99 97* 97*  CO2 22 22 26 27   GLUCOSE 164* 328* 162* 151*  BUN 16 19 27* 30*  CREATININE 1.37* 1.50* 1.61* 1.43*  CALCIUM  8.7* 8.4* 8.5* 8.7*  MG  --  1.7  --   --    Liver Function Tests: No results for input(s): AST, ALT, ALKPHOS, BILITOT, PROT, ALBUMIN in the last 168 hours. No results for input(s): LIPASE, AMYLASE in the last 168 hours. No results for input(s): AMMONIA in the last 168 hours. CBC: Recent Labs  Lab 02/08/24 1833 02/09/24 0501 02/10/24 0426 02/11/24 0357  WBC 8.4 4.9 8.9 9.2  NEUTROABS  --   --  6.9 6.6  HGB 8.3*  7.5* 8.6* 9.2*  HCT 27.6* 24.6* 27.6* 29.5*  MCV 83.1 82.8 82.9 83.8  PLT 238 189 211 250   Cardiac Enzymes: No results for input(s): CKTOTAL, CKMB, CKMBINDEX, TROPONINI in the last 168 hours. BNP: Invalid input(s): POCBNP CBG: Recent Labs  Lab 02/10/24 1145 02/10/24 1645 02/10/24 2231 02/11/24 0826 02/11/24 1157  GLUCAP 203* 450* 170* 127* 166*   D-Dimer No results for input(s): DDIMER in the last 72 hours. Hgb A1c Recent Labs    02/08/24 1833  HGBA1C 7.7*   Lipid Profile No results for input(s): CHOL, HDL, LDLCALC, TRIG, CHOLHDL, LDLDIRECT in the last 72 hours. Thyroid  function studies No results for input(s): TSH, T4TOTAL, T3FREE, THYROIDAB in the last 72 hours.  Invalid input(s): FREET3 Anemia work up Recent Labs    02/09/24 0041  VITAMINB12 359  FOLATE 10.8  FERRITIN 26  TIBC 437  IRON 18*  RETICCTPCT 2.0   Urinalysis    Component Value Date/Time    COLORURINE YELLOW (A) 09/23/2023 1015   APPEARANCEUR Hazy (A) 10/06/2023 0957   LABSPEC 1.014 09/23/2023 1015   PHURINE 6.0 09/23/2023 1015   GLUCOSEU Negative 10/06/2023 0957   HGBUR NEGATIVE 09/23/2023 1015   BILIRUBINUR Negative 10/06/2023 0957   KETONESUR NEGATIVE 09/23/2023 1015   PROTEINUR Trace 10/06/2023 0957   PROTEINUR NEGATIVE 09/23/2023 1015   NITRITE Negative 10/06/2023 0957   NITRITE NEGATIVE 09/23/2023 1015   LEUKOCYTESUR 1+ (A) 10/06/2023 0957   LEUKOCYTESUR TRACE (A) 09/23/2023 1015   Sepsis Labs Recent Labs  Lab 02/08/24 1833 02/09/24 0501 02/10/24 0426 02/11/24 0357  WBC 8.4 4.9 8.9 9.2   Microbiology Recent Results (from the past 240 hours)  Resp panel by RT-PCR (RSV, Flu A&B, Covid) Anterior Nasal Swab     Status: Abnormal   Collection Time: 02/08/24  8:00 PM   Specimen: Anterior Nasal Swab  Result Value Ref Range Status   SARS Coronavirus 2 by RT PCR NEGATIVE NEGATIVE Final    Comment: (NOTE) SARS-CoV-2 target nucleic acids are NOT DETECTED.  The SARS-CoV-2 RNA is generally detectable in upper respiratory specimens during the acute phase of infection. The lowest concentration of SARS-CoV-2 viral copies this assay can detect is 138 copies/mL. A negative result does not preclude SARS-Cov-2 infection and should not be used as the sole basis for treatment or other patient management decisions. A negative result may occur with  improper specimen collection/handling, submission of specimen other than nasopharyngeal swab, presence of viral mutation(s) within the areas targeted by this assay, and inadequate number of viral copies(<138 copies/mL). A negative result must be combined with clinical observations, patient history, and epidemiological information. The expected result is Negative.  Fact Sheet for Patients:  bloggercourse.com  Fact Sheet for Healthcare Providers:  seriousbroker.it  This test  is no t yet approved or cleared by the [place]  FDA and  has been authorized for detection and/or diagnosis of SARS-CoV-2 by FDA under an Emergency Use Authorization (EUA). This EUA will remain  in effect (meaning this test can be used) for the duration of the COVID-19 declaration under Section 564(b)(1) of the Act, 21 U.S.C.section 360bbb-3(b)(1), unless the authorization is terminated  or revoked sooner.       Influenza A by PCR POSITIVE (A) NEGATIVE Final   Influenza B by PCR NEGATIVE NEGATIVE Final    Comment: (NOTE) The Xpert Xpress SARS-CoV-2/FLU/RSV plus assay is intended as an aid in the diagnosis of influenza from Nasopharyngeal swab specimens and should not be used as a sole  basis for treatment. Nasal washings and aspirates are unacceptable for Xpert Xpress SARS-CoV-2/FLU/RSV testing.  Fact Sheet for Patients: bloggercourse.com  Fact Sheet for Healthcare Providers: seriousbroker.it  This test is not yet approved or cleared by the [place]  FDA and has been authorized for detection and/or diagnosis of SARS-CoV-2 by FDA under an Emergency Use Authorization (EUA). This EUA will remain in effect (meaning this test can be used) for the duration of the COVID-19 declaration under Section 564(b)(1) of the Act, 21 U.S.C. section 360bbb-3(b)(1), unless the authorization is terminated or revoked.     Resp Syncytial Virus by PCR NEGATIVE NEGATIVE Final    Comment: (NOTE) Fact Sheet for Patients: bloggercourse.com  Fact Sheet for Healthcare Providers: seriousbroker.it  This test is not yet approved or cleared by the [place]  FDA and has been authorized for detection and/or diagnosis of SARS-CoV-2 by FDA under an Emergency Use Authorization (EUA). This EUA will remain in effect (meaning this test can be used) for the duration of the COVID-19 declaration under  Section 564(b)(1) of the Act, 21 U.S.C. section 360bbb-3(b)(1), unless the authorization is terminated or revoked.  Performed at [facility], [address] [place], [place] [address]      Time coordinating discharge: 40 minutes  SIGNED:   Calvin KATHEE Robson, MD  [facility] 02/11/2024, 3:50 PM Pager   If 7PM-7AM, please contact night-coverage     [1]  Allergies Allergen Reactions   Ace Inhibitors Swelling   Rosiglitazone Other (See Comments)    Other Reaction: SOB, Pulmonary Edema Other Reaction: SOB, Pulmonary Edema Other Reaction: SOB, Pulmonary Edema    Aspirin  Swelling    When taken with a beta blocker    Golytely [Peg 3350-Kcl-Nabcb-Nacl-Nasulf] Swelling

## 2024-02-14 ENCOUNTER — Emergency Department
Admission: EM | Admit: 2024-02-14 | Discharge: 2024-02-14 | Disposition: A | Discharge status: Home / Self Care | Attending: Emergency Medicine | Admitting: Emergency Medicine

## 2024-02-14 ENCOUNTER — Emergency Department

## 2024-02-14 DIAGNOSIS — R233 Spontaneous ecchymoses: Secondary | ICD-10-CM | POA: Diagnosis present

## 2024-02-14 DIAGNOSIS — R58 Hemorrhage, not elsewhere classified: Secondary | ICD-10-CM

## 2024-02-14 LAB — CBC WITH DIFFERENTIAL/PLATELET
Abs Immature Granulocytes: 0.07 K/uL (ref 0.00–0.07)
Basophils Absolute: 0 K/uL (ref 0.0–0.1)
Basophils Relative: 0 %
Eosinophils Absolute: 0 K/uL (ref 0.0–0.5)
Eosinophils Relative: 0 %
HCT: 32.3 % — ABNORMAL LOW (ref 36.0–46.0)
Hemoglobin: 9.9 g/dL — ABNORMAL LOW (ref 12.0–15.0)
Immature Granulocytes: 1 %
Lymphocytes Relative: 18 %
Lymphs Abs: 1.6 K/uL (ref 0.7–4.0)
MCH: 25.6 pg — ABNORMAL LOW (ref 26.0–34.0)
MCHC: 30.7 g/dL (ref 30.0–36.0)
MCV: 83.5 fL (ref 80.0–100.0)
Monocytes Absolute: 0.9 K/uL (ref 0.1–1.0)
Monocytes Relative: 10 %
Neutro Abs: 6 K/uL (ref 1.7–7.7)
Neutrophils Relative %: 71 %
Platelets: 314 K/uL (ref 150–400)
RBC: 3.87 MIL/uL (ref 3.87–5.11)
RDW: 16.4 % — ABNORMAL HIGH (ref 11.5–15.5)
WBC: 8.6 K/uL (ref 4.0–10.5)
nRBC: 0 % (ref 0.0–0.2)

## 2024-02-14 LAB — BASIC METABOLIC PANEL WITH GFR
Anion gap: 14 (ref 5–15)
BUN: 25 mg/dL — ABNORMAL HIGH (ref 8–23)
CO2: 23 mmol/L (ref 22–32)
Calcium: 8.8 mg/dL — ABNORMAL LOW (ref 8.9–10.3)
Chloride: 94 mmol/L — ABNORMAL LOW (ref 98–111)
Creatinine, Ser: 1.35 mg/dL — ABNORMAL HIGH (ref 0.44–1.00)
GFR, Estimated: 40 mL/min — ABNORMAL LOW
Glucose, Bld: 263 mg/dL — ABNORMAL HIGH (ref 70–99)
Potassium: 4.1 mmol/L (ref 3.5–5.1)
Sodium: 131 mmol/L — ABNORMAL LOW (ref 135–145)

## 2024-02-14 LAB — PROTIME-INR
INR: 1 (ref 0.8–1.2)
Prothrombin Time: 13.8 s (ref 11.4–15.2)

## 2024-02-14 LAB — APTT: aPTT: 27 s (ref 24–36)

## 2024-02-14 MED ORDER — IOHEXOL 350 MG/ML SOLN
75.0000 mL | Freq: Once | INTRAVENOUS | Status: AC | PRN
  Administered 2024-02-14: 75 mL via INTRAVENOUS

## 2024-02-14 MED ORDER — SODIUM CHLORIDE 0.9 % IV BOLUS: 500.0000 mL | Freq: Once | INTRAVENOUS | Status: DC

## 2024-02-14 MED ORDER — FENTANYL CITRATE (PF) 50 MCG/ML IJ SOSY
50.0000 ug | PREFILLED_SYRINGE | Freq: Once | INTRAMUSCULAR | Status: AC
  Administered 2024-02-14: 50 ug via INTRAVENOUS
  Filled 2024-02-14: qty 1

## 2024-02-14 NOTE — ED Triage Notes (Signed)
 Pt presents to the ED via POV from home with daughter. Pt was admitted here on 12/25 and discharge on 12/28 with the flu and hypoxia. Pt noticed today that she has bruising on her left lower abd and around her umbilicus. Pt has been coughing. No use of blood thinner. Pt does take a daily aspirin . No known injury. Pt unsure if bruising was there prior to today.

## 2024-02-14 NOTE — ED Provider Notes (Signed)
 "  [facility] Provider Note    Event Date/Time   First MD Initiated Contact with Patient 02/14/24 2001     (approximate)   History   Bleeding/Bruising   HPI  Kerri Mccullough is a 78 y.o. female who presents to the emergency department today because concerns for bruising and noted to her abdomen.  The patient was recently admitted to the hospital because of influenza and hypoxia.  She was discharged 3 days ago.  She states that while she was in the hospital she did start developing some left sided abdominal pain.  She just thought it might have been due to her coughing a lot.  She then at home noticed some bruising.  Bruising to the left flank and around her bellybutton.  She continues to have some discomfort.  She denies any trauma.  States that her shortness of breath and flu symptoms have improved.     Physical Exam   Triage Vital Signs: ED Triage Vitals  Encounter Vitals Group     BP 02/14/24 1807 (!) 164/63     Girls Systolic BP Percentile --      Girls Diastolic BP Percentile --      Boys Systolic BP Percentile --      Boys Diastolic BP Percentile --      Pulse Rate 02/14/24 1807 61     Resp 02/14/24 1807 18     Temp 02/14/24 1807 98.7 F (37.1 C)     Temp Source 02/14/24 1807 Oral     SpO2 02/14/24 1806 93 %     Weight 02/14/24 1807 173 lb (78.5 kg)     Height 02/14/24 1807 5' 4 (1.626 m)     Head Circumference --      Peak Flow --      Pain Score 02/14/24 1807 5     Pain Loc --      Pain Education --      Exclude from Growth Chart --     Most recent vital signs: Vitals:   02/14/24 1806 02/14/24 1807  BP:  (!) 164/63  Pulse:  61  Resp:  18  Temp:  98.7 F (37.1 C)  SpO2: 93% 93%   General: Awake, alert, oriented. CV:  Good peripheral perfusion. Regular rate and rhythm. Resp:  Normal effort. Lungs clear. Abd:  No distention.  Other:  Periumbilical and left flank bruising.    ED Results / Procedures / Treatments    Labs (all labs ordered are listed, but only abnormal results are displayed) Labs Reviewed  CBC WITH DIFFERENTIAL/PLATELET - Abnormal; Notable for the following components:      Result Value   Hemoglobin 9.9 (*)    HCT 32.3 (*)    MCH 25.6 (*)    RDW 16.4 (*)    All other components within normal limits  BASIC METABOLIC PANEL WITH GFR  PROTIME-INR  APTT     EKG  None   RADIOLOGY I independently interpreted and visualized the CTA abd/pel. My interpretation: No intraabdominal bleed. Subcutaneous findings in left flank and periumbilical area. Radiology interpretation:  IMPRESSION:  1. Multifocal subcutaneous stranding within the anterior abdominal wall,  possibly bruising.  2. 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      PROCEDURES:  Critical Care performed: No   MEDICATIONS ORDERED IN ED: Medications - No data to display   IMPRESSION / MDM / ASSESSMENT AND PLAN / ED COURSE  I  reviewed the triage vital signs and the nursing notes.                              Differential diagnosis includes, but is not limited to, intraabdominal bleed, retroperitoneal bleed, rectus hematoma, anemia  Patient's presentation is most consistent with acute presentation with potential threat to life or bodily function.   Patient presented to the emergency department today because of periumbilical and left flank bruising.  Patient did have some associated discomfort to these areas.  On exam patient without any significant abdominal tenderness.  Patient is neither tachycardic nor hypotensive.  Given location bruising will check CT scan to evaluate for significant intra-abdominal or retroperitoneal bleed.  CT scan does show some subcutaneous findings consistent with bruising, fortunately no intraabdominal abnormality. Discussed this with the patient. Will plan on discharging.    FINAL CLINICAL IMPRESSION(S) / ED DIAGNOSES   Final  diagnoses:  Ecchymosis        Rx / DC Orders   ED Discharge Orders     None        Note:  This document was prepared using Dragon voice recognition software and may include unintentional dictation errors.    Floy Roberts, MD 02/14/24 [phone]  "

## 2024-02-20 ENCOUNTER — Inpatient Hospital Stay

## 2024-02-20 ENCOUNTER — Inpatient Hospital Stay: Attending: Oncology | Admitting: Oncology

## 2024-02-20 ENCOUNTER — Encounter: Payer: Self-pay | Admitting: Oncology

## 2024-02-20 VITALS — BP 173/77 | HR 61 | Temp 98.2°F | Resp 18 | Wt 168.1 lb

## 2024-02-20 DIAGNOSIS — Z801 Family history of malignant neoplasm of trachea, bronchus and lung: Secondary | ICD-10-CM | POA: Diagnosis not present

## 2024-02-20 DIAGNOSIS — E1122 Type 2 diabetes mellitus with diabetic chronic kidney disease: Secondary | ICD-10-CM | POA: Insufficient documentation

## 2024-02-20 DIAGNOSIS — D509 Iron deficiency anemia, unspecified: Secondary | ICD-10-CM | POA: Insufficient documentation

## 2024-02-20 DIAGNOSIS — Z87891 Personal history of nicotine dependence: Secondary | ICD-10-CM | POA: Insufficient documentation

## 2024-02-20 DIAGNOSIS — N1832 Chronic kidney disease, stage 3b: Secondary | ICD-10-CM | POA: Insufficient documentation

## 2024-02-20 DIAGNOSIS — D631 Anemia in chronic kidney disease: Secondary | ICD-10-CM | POA: Insufficient documentation

## 2024-02-20 DIAGNOSIS — I129 Hypertensive chronic kidney disease with stage 1 through stage 4 chronic kidney disease, or unspecified chronic kidney disease: Secondary | ICD-10-CM | POA: Insufficient documentation

## 2024-02-20 NOTE — Progress Notes (Signed)
 " Hematology/Oncology Consult note [phone] [phone]        REFERRING PROVIDER: Autry Grayce LABOR, PA   CHIEF COMPLAINTS/REASON FOR VISIT:  Evaluation of iron deficiency anemia    ASSESSMENT & PLAN:   Iron deficiency anemia Lab Results  Component Value Date   HGB 9.9 (L) 02/14/2024   TIBC 437 02/09/2024   IRONPCTSAT 4 (L) 02/09/2024   FERRITIN 26 02/09/2024    Recommend IV Venofer weekly x 3 to further improve iron stores follow-up both iron deficient anemia and anemia due to chronic kidney disease.  I discussed about the potential risks including but not limited to allergic reactions/infusion reactions including anaphylactic reactions, diarrhea, phlebitis, high blood pressure, wheezing, SOB, skin rash, weight gain,dark urine, leg swelling, back pain, headache, nausea and fatigue, etc. Patient agrees with infusion treatments.  Etiology of iron deficient anemia.  Suspect GI blood loss.  Patient declined GI workup.  Last colonoscopy was in 2021.  Anemia in chronic kidney disease (CKD) See above. IV Venofer treatments to further improve iron stores.  If hemoglobin is persistently less than 10 despite improved oral iron supplementation, consider erythropoietin replacement therapy.  Chronic kidney disease, stage 3b (HCC) Encourage oral hydration and avoid nephrotoxins.  Check multiple myeloma panel, maturation   Orders Placed This Encounter  Procedures   CBC with Differential (Cancer Center Only)    Standing Status:   Future    Expected Date:   04/16/2024    Expiration Date:   07/15/2024   Iron and TIBC    Standing Status:   Future    Expected Date:   04/16/2024    Expiration Date:   07/15/2024   Ferritin    Standing Status:   Future    Expected Date:   04/16/2024    Expiration Date:   07/15/2024   Retic Panel    Standing Status:   Future    Expected Date:   04/16/2024    Expiration Date:   07/15/2024   Multiple Myeloma Panel (SPEP&IFE w/QIG)    Standing  Status:   Future    Expected Date:   04/16/2024    Expiration Date:   07/15/2024   Kappa/lambda light chains    Standing Status:   Future    Expected Date:   04/16/2024    Expiration Date:   07/15/2024  Follow-up in 8 to 10 weeks for evaluation of treatment response.  All questions were answered. The patient knows to call the clinic with any problems, questions or concerns.  Zelphia Cap, MD, PhD [facility] Hematology Oncology 02/20/2024   HISTORY OF PRESENTING ILLNESS:   Kerri Mccullough is a  79 y.o.  female with PMH listed below was seen in consultation at the request of  Autry Grayce LABOR, PA  for evaluation of iron deficiency anemia.  Discussed the use of AI scribe software for clinical note transcription with the patient, who gave verbal consent to proceed.   02/08/2024 - 02/11/2024, patient was hospitalized due to influenza A, acute heart failure, acute respiratory failure with hypoxia.  During hospitalization, she was also found to have iron deficiency anemia with hemoglobin of 7.5.  Iron panel showed decreased iron saturation of 4.  Patient received 1 unit of PRBC transfusion.  On February 14, 2024, she returned to the emergency room with new ecchymoses on her abdomen and side. CT imaging did not reveal internal bleeding. She denies use of anticoagulants other than daily aspirin  and did not receive heparin  during her  hospitalization. She denies melena or hematochezia and has not noticed changes in stool color. Colonoscopy in 2021 showed non-bleeding internal hemorrhoids and no polyps.  Today patient reports feeling better.  Chronic fatigue.  MEDICAL HISTORY:  Past Medical History:  Diagnosis Date   Actinic keratosis    Coronary artery disease    Diabetes mellitus without complication (HCC)    GERD (gastroesophageal reflux disease)    Heart attack (HCC)    High cholesterol    Hypertension    Squamous cell carcinoma of skin    R cheek - treated at [facility]    SURGICAL HISTORY: Past  Surgical History:  Procedure Laterality Date   ABDOMINAL HYSTERECTOMY     CHOLECYSTECTOMY     COLONOSCOPY WITH PROPOFOL  N/A 10/14/2019   Procedure: COLONOSCOPY WITH PROPOFOL ;  Surgeon: Maryruth Ole DASEN, MD;  Location: [facility] ENDOSCOPY;  Service: Endoscopy;  Laterality: N/A;   CORONARY ANGIOPLASTY WITH STENT PLACEMENT     ESOPHAGOGASTRODUODENOSCOPY (EGD) WITH PROPOFOL  N/A 10/14/2019   Procedure: ESOPHAGOGASTRODUODENOSCOPY (EGD) WITH PROPOFOL ;  Surgeon: Maryruth Ole DASEN, MD;  Location: [facility] ENDOSCOPY;  Service: Endoscopy;  Laterality: N/A;   LEFT HEART CATH AND CORONARY ANGIOGRAPHY N/A 05/07/2018   Procedure: LEFT HEART CATH AND CORONARY ANGIOGRAPHY with possible PCI and stent;  Surgeon: Florencio Cara BIRCH, MD;  Location: [facility] INVASIVE CV LAB;  Service: Cardiovascular;  Laterality: N/A;    SOCIAL HISTORY: Social History   Socioeconomic History   Marital status: Widowed    Spouse name: Not on file   Number of children: Not on file   Years of education: Not on file   Highest education level: Not on file  Occupational History   Occupation: retired    Comment: textile worker/ worked at [employer]  Tobacco Use   Smoking status: Former    Types: Cigarettes    Passive exposure: Past   Smokeless tobacco: Never  Vaping Use   Vaping status: Never Used  Substance and Sexual Activity   Alcohol use: No   Drug use: No   Sexual activity: Not on file  Other Topics Concern   Not on file  Social History Narrative   Not on file   Social Drivers of Health   Tobacco Use: Medium Risk (02/20/2024)   Patient History    Smoking Tobacco Use: Former    Smokeless Tobacco Use: Never    Passive Exposure: Past  Physicist, Medical Strain: Low Risk (02/20/2024)   Overall Financial Resource Strain (CARDIA)    Difficulty of Paying Living Expenses: Not very hard  Food Insecurity: No Food Insecurity (02/20/2024)   Epic    Worried About Radiation Protection Practitioner of Food in the Last Year: Never true    Ran Out of Food in the  Last Year: Never true  Transportation Needs: No Transportation Needs (02/20/2024)   Epic    Lack of Transportation (Medical): No    Lack of Transportation (Non-Medical): No  Physical Activity: Not on file  Stress: No Stress Concern Present (02/20/2024)   [employer] of Occupational Health - Occupational Stress Questionnaire    Feeling of Stress: Not at all  Social Connections: Moderately Isolated (02/09/2024)   Social Connection and Isolation Panel    Frequency of Communication with Friends and Family: More than three times a week    Frequency of Social Gatherings with Friends and Family: More than three times a week    Attends Religious Services: 1 to 4 times per year    Active Member of Clubs or Organizations: No  Attends Banker Meetings: Never    Marital Status: Widowed  Intimate Partner Violence: Not At Risk (02/20/2024)   Epic    Fear of Current or Ex-Partner: No    Emotionally Abused: No    Physically Abused: No    Sexually Abused: No  Depression (PHQ2-9): Low Risk (02/20/2024)   Depression (PHQ2-9)    PHQ-2 Score: 4  Alcohol Screen: Not on file  Housing: Low Risk (02/20/2024)   Epic    Unable to Pay for Housing in the Last Year: No    Number of Times Moved in the Last Year: 0    Homeless in the Last Year: No  Utilities: Not At Risk (02/20/2024)   Epic    Threatened with loss of utilities: No  Health Literacy: Adequate Health Literacy (02/20/2024)   B1300 Health Literacy    Frequency of need for help with medical instructions: Never    FAMILY HISTORY: Family History  Problem Relation Age of Onset   Diabetes Mother    Heart failure Mother    Lung cancer Father    Lung cancer Sister    Lung cancer Brother     ALLERGIES:  is allergic to ace inhibitors, rosiglitazone, aspirin , and golytely [[id number]].  MEDICATIONS:  Current Outpatient Medications  Medication Sig Dispense Refill   amLODipine  (NORVASC ) 5 MG tablet Take 5 mg by mouth  2 (two) times daily.     aspirin  EC 81 MG tablet Take 81 mg by mouth daily.     atorvastatin  (LIPITOR) 20 MG tablet Take 20 mg by mouth daily.     carvedilol  (COREG ) 6.25 MG tablet Take 1 tablet (6.25 mg total) by mouth 2 (two) times daily with a meal. 60 tablet 0   citalopram  (CELEXA ) 20 MG tablet Take 20 mg by mouth daily.     estradiol  (ESTRACE  VAGINAL) 0.1 MG/GM vaginal cream Estrogen Cream Instruction Discard applicator Apply pea sized amount to tip of finger to urethra before bed. Wash hands well after application. Use Monday, Wednesday and Friday 42.5 g 12   ferrous sulfate  325 (65 FE) MG tablet Take 1 tablet (325 mg total) by mouth daily with breakfast. 30 tablet 0   furosemide  (LASIX ) 20 MG tablet Take 1 tablet (20 mg total) by mouth daily. 30 tablet 0   metFORMIN  (GLUCOPHAGE ) 1000 MG tablet Take 1 tablet by mouth 2 (two) times daily.     nitrofurantoin  (MACRODANTIN ) 100 MG capsule Take 1 capsule (100 mg total) by mouth daily. 30 capsule 11   omeprazole (PRILOSEC) 40 MG capsule Take 40 mg by mouth daily.     TRULICITY 0.75 MG/0.5ML SOAJ Inject 0.75 mg into the skin once a week.     No current facility-administered medications for this visit.    Review of Systems  Constitutional:  Positive for fatigue. Negative for appetite change, chills and fever.  HENT:   Negative for hearing loss and voice change.   Eyes:  Negative for eye problems.  Respiratory:  Negative for chest tightness, cough and shortness of breath.   Cardiovascular:  Negative for chest pain and palpitations.  Gastrointestinal:  Negative for abdominal distention, abdominal pain, blood in stool, diarrhea, nausea and vomiting.  Endocrine: Negative for hot flashes.  Genitourinary:  Negative for difficulty urinating and frequency.   Musculoskeletal:  Negative for arthralgias.  Skin:  Negative for itching and rash.  Neurological:  Negative for extremity weakness and light-headedness.  Hematological:  Negative for  adenopathy. Bruises/bleeds easily.  Psychiatric/Behavioral:  Negative for confusion.    PHYSICAL EXAMINATION:  Vitals:   02/20/24 1127 02/20/24 1140  BP: (!) 189/70 (!) 173/77  Pulse: 61   Resp: 18   Temp: 98.2 F (36.8 C)   SpO2: 96%    Filed Weights   02/20/24 1127  Weight: 168 lb 1.6 oz (76.2 kg)    Physical Exam Constitutional:      General: She is not in acute distress. HENT:     Head: Normocephalic and atraumatic.  Eyes:     General: No scleral icterus. Cardiovascular:     Rate and Rhythm: Normal rate and regular rhythm.  Pulmonary:     Effort: Pulmonary effort is normal. No respiratory distress.     Breath sounds: Normal breath sounds. No wheezing.  Abdominal:     General: Bowel sounds are normal. There is no distension.     Palpations: Abdomen is soft.  Musculoskeletal:        General: No deformity. Normal range of motion.     Cervical back: Normal range of motion and neck supple.  Skin:    General: Skin is warm and dry.     Findings: No erythema or rash.  Neurological:     Mental Status: She is alert and oriented to person, place, and time. Mental status is at baseline.  Psychiatric:        Mood and Affect: Mood normal.     LABORATORY DATA:  I have reviewed the data as listed    Latest Ref Rng & Units 02/14/2024    8:24 PM 02/11/2024    3:57 AM 02/10/2024    4:26 AM  CBC  WBC 4.0 - 10.5 K/uL 8.6  9.2  8.9   Hemoglobin 12.0 - 15.0 g/dL 9.9  9.2  8.6   Hematocrit 36.0 - 46.0 % 32.3  29.5  27.6   Platelets 150 - 400 K/uL 314  250  211       Latest Ref Rng & Units 02/14/2024    8:24 PM 02/11/2024    3:57 AM 02/10/2024    4:26 AM  CMP  Glucose 70 - 99 mg/dL 736  848  837   BUN 8 - 23 mg/dL 25  30  27    Creatinine 0.44 - 1.00 mg/dL 8.64  8.56  8.38   Sodium 135 - 145 mmol/L 131  135  134   Potassium 3.5 - 5.1 mmol/L 4.1  4.2  4.3   Chloride 98 - 111 mmol/L 94  97  97   CO2 22 - 32 mmol/L 23  27  26    Calcium  8.9 - 10.3 mg/dL 8.8  8.7  8.5        RADIOGRAPHIC STUDIES: I have personally reviewed the radiological images as listed and agreed with the findings in the report. CT Angio Abd/Pel W and/or Wo Contrast Addendum Date: 02/15/2024  ADDENDUM #1  ADDENDUM: Correction to exam/technique. There was no non-contrast imaging obtained. Images were obtained in the arterial and venous post-contrast phases. ---------------------------------------------------- Electronically signed by: Franky Stanford MD 02/15/2024 02:25 AM EST RP Workstation: [id number]   Result Date: 02/15/2024  ORIGINAL REPORTEXAM: CTA ABDOMEN AND PELVIS WITHOUT AND WITH CONTRAST 02/14/2024 09:50:47 PM TECHNIQUE: CTA images of the abdomen and pelvis without and with intravenous contrast. 75 mL (iohexol  (OMNIPAQUE ) 350 MG/ML injection 75 mL IOHEXOL  350 MG/ML SOLN) was administered. Three-dimensional MIP/volume rendered formations were performed. Automated exposure control, iterative reconstruction, and/or weight based adjustment of the mA/kV was utilized to reduce the  radiation dose to as low as reasonably achievable. COMPARISON: 09/23/2023 CLINICAL HISTORY: bruising, pain FINDINGS: VASCULATURE: AORTA: Calcific aortic atherosclerosis. No aortic dissection or other acute aortic syndrome. No abdominal aortic aneurysm. CELIAC TRUNK: No acute finding. No occlusion or significant stenosis. SUPERIOR MESENTERIC ARTERY: No acute finding. No occlusion or significant stenosis. The superior mesenteric vein is patent. RENAL ARTERIES: Moderate stenosis at the origin of the right renal artery. The left renal artery is unremarkable. ILIAC ARTERIES: No acute finding. No occlusion or significant stenosis. The portal vein and splenic vein are patent. LIVER: The liver is unremarkable. GALLBLADDER AND BILE DUCTS: Gallbladder is unremarkable. No biliary ductal dilatation. SPLEEN: The spleen is unremarkable. PANCREAS: The pancreas is unremarkable. ADRENAL GLANDS: Bilateral adrenal glands demonstrate no acute  abnormality. KIDNEYS, URETERS AND BLADDER: No stones in the kidneys or ureters. No hydronephrosis. No perinephric or periureteral stranding. Urinary bladder is unremarkable. GI AND BOWEL: Hiatal hernia. The appendix is not clearly visualized. Stomach and duodenal sweep demonstrate no acute abnormality. There is no bowel obstruction. No abnormal bowel wall thickening or distension. REPRODUCTIVE: Reproductive organs are unremarkable. PERITONEUM AND RETRPERITONEUM: No ascites or free air. LUNG BASE: No acute abnormality. LYMPH NODES: No lymphadenopathy. BONES AND SOFT TISSUES: Chronic compression fractures of L1 and L2 with vertebra plana morphology. 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 Multifocal subcutaneous stranding within the anterior abdomen, possibly bruising. IMPRESSION: 1. Multifocal subcutaneous stranding within the anterior abdominal wall, possibly bruising. 2. Calcific aortic atherosclerosis without aortic dissection or other acute aortic syndrome. Electronically signed by: Franky Stanford MD 02/14/2024 10:47 PM EST RP Workstation: [id number]   ECHOCARDIOGRAM COMPLETE Result Date: 02/09/2024    ECHOCARDIOGRAM REPORT   Patient Name:   LOLETA FROMMELT Date of Exam: 02/09/2024 Medical Rec #:  [id number]          Height:       64.0 in Accession #:    [id number]         Weight:       173.5 lb Date of Birth:  02/17/1945           BSA:          1.842 m Patient Age:    78 years           BP:           142/59 mmHg Patient Gender: F                  HR:           84 bpm. Exam Location:  [facility] Procedure: 2D Echo, Cardiac Doppler and Color Doppler (Both Spectral and Color            Flow Doppler were utilized during procedure). Indications:     CHF-Acute Diastolic I50.31  History:         Patient has no prior history of Echocardiogram examinations.                  CHF.  Sonographer:     Ashley McNeely-Sloane Referring Phys:  [id number] XILIN NIU Diagnosing Phys: Cara JONETTA Lovelace MD IMPRESSIONS   1. Left ventricular ejection fraction, by estimation, is 55 to 60%. The left ventricle has normal function. The left ventricle has no regional wall motion abnormalities. There is mild left ventricular hypertrophy. Left ventricular diastolic parameters are consistent with Grade II diastolic dysfunction (pseudonormalization).  2. Right ventricular systolic function is normal. The right ventricular size is normal.  3. Left  atrial size was mildly dilated.  4. The mitral valve is grossly normal. Mild mitral valve regurgitation.  5. The aortic valve is grossly normal. Aortic valve regurgitation is not visualized. FINDINGS  Left Ventricle: Left ventricular ejection fraction, by estimation, is 55 to 60%. The left ventricle has normal function. The left ventricle has no regional wall motion abnormalities. Strain was performed and the global longitudinal strain is indeterminate. The left ventricular internal cavity size was normal in size. There is mild left ventricular hypertrophy. Left ventricular diastolic parameters are consistent with Grade II diastolic dysfunction (pseudonormalization). Right Ventricle: The right ventricular size is normal. No increase in right ventricular wall thickness. Right ventricular systolic function is normal. Left Atrium: Left atrial size was mildly dilated. Right Atrium: Right atrial size was normal in size. Pericardium: There is no evidence of pericardial effusion. Mitral Valve: The mitral valve is grossly normal. Mild mitral valve regurgitation. MV peak gradient, 9.6 mmHg. The mean mitral valve gradient is 4.0 mmHg. Tricuspid Valve: The tricuspid valve is normal in structure. Tricuspid valve regurgitation is mild. Aortic Valve: The aortic valve is grossly normal. Aortic valve regurgitation is not visualized. Aortic valve mean gradient measures 6.0 mmHg. Aortic valve peak gradient measures 11.3 mmHg. Aortic valve area, by VTI measures 2.24 cm. Pulmonic Valve: The pulmonic valve was not well  visualized. Pulmonic valve regurgitation is not visualized. Aorta: The ascending aorta was not well visualized. IAS/Shunts: No atrial level shunt detected by color flow Doppler. Additional Comments: 3D was performed not requiring image post processing on an independent workstation and was indeterminate.  LEFT VENTRICLE PLAX 2D LVIDd:         4.80 cm     Diastology LVIDs:         3.50 cm     LV e' medial:    8.38 cm/s LV PW:         1.00 cm     LV E/e' medial:  18.6 LV IVS:        1.10 cm     LV e' lateral:   11.40 cm/s LVOT diam:     2.00 cm     LV E/e' lateral: 13.7 LV SV:         82 LV SV Index:   45 LVOT Area:     3.14 cm LV IVRT:       74 msec  LV Volumes (MOD) LV vol d, MOD A2C: 56.8 ml LV vol d, MOD A4C: 68.8 ml LV vol s, MOD A2C: 21.8 ml LV vol s, MOD A4C: 35.7 ml LV SV MOD A2C:     35.0 ml LV SV MOD A4C:     68.8 ml LV SV MOD BP:      38.5 ml RIGHT VENTRICLE             IVC RV Basal diam:  4.30 cm     IVC diam: 2.40 cm RV Mid diam:    2.90 cm RV S prime:     10.60 cm/s  PULMONARY VEINS TAPSE (M-mode): 2.6 cm      Diastolic Velocity: 106.00 cm/s                             S/D Velocity:       0.70                             Systolic Velocity:  69.10 cm/s LEFT ATRIUM             Index        RIGHT ATRIUM           Index LA diam:        4.00 cm 2.17 cm/m   RA Area:     13.80 cm LA Vol (A2C):   61.9 ml 33.61 ml/m  RA Volume:   30.40 ml  16.51 ml/m LA Vol (A4C):   89.1 ml 48.38 ml/m LA Biplane Vol: 77.6 ml 42.14 ml/m  AORTIC VALVE                     PULMONIC VALVE AV Area (Vmax):    2.37 cm      PV Vmax:        1.15 m/s AV Area (Vmean):   2.29 cm      PV Vmean:       79.100 cm/s AV Area (VTI):     2.24 cm      PV VTI:         0.230 m AV Vmax:           168.00 cm/s   PV Peak grad:   5.3 mmHg AV Vmean:          117.000 cm/s  PV Mean grad:   3.0 mmHg AV VTI:            0.368 m       RVOT Peak grad: 3 mmHg AV Peak Grad:      11.3 mmHg AV Mean Grad:      6.0 mmHg LVOT Vmax:         127.00 cm/s LVOT Vmean:         85.200 cm/s LVOT VTI:          0.262 m LVOT/AV VTI ratio: 0.71  AORTA Ao Root diam: 2.10 cm Ao Asc diam:  2.50 cm MITRAL VALVE                TRICUSPID VALVE MV Area (PHT): 5.54 cm     TR Peak grad:   32.5 mmHg MV Area VTI:   2.15 cm     TR Mean grad:   22.0 mmHg MV Peak grad:  9.6 mmHg     TR Vmax:        285.00 cm/s MV Mean grad:  4.0 mmHg     TR Vmean:       223.0 cm/s MV Vmax:       1.55 m/s MV Vmean:      95.7 cm/s    SHUNTS MV Decel Time: 137 msec     Systemic VTI:  0.26 m MV E velocity: 156.00 cm/s  Systemic Diam: 2.00 cm MV A velocity: 95.90 cm/s   Pulmonic VTI:  0.134 m MV E/A ratio:  1.63 Dwayne D Callwood MD Electronically signed by Cara JONETTA Lovelace MD Signature Date/Time: 02/09/2024/1:24:02 PM    Final    CT Angio Chest PE W/Cm &/Or Wo Cm Result Date: 02/08/2024 CLINICAL DATA:  Shortness of breath EXAM: CT ANGIOGRAPHY CHEST WITH CONTRAST TECHNIQUE: Multidetector CT imaging of the chest was performed using the standard protocol during bolus administration of intravenous contrast. Multiplanar CT image reconstructions and MIPs were obtained to evaluate the vascular anatomy. RADIATION DOSE REDUCTION: This exam was performed according to the departmental dose-optimization program which includes automated exposure control, adjustment of the mA and/or kV according to patient size and/or  use of iterative reconstruction technique. CONTRAST:  75mL OMNIPAQUE  IOHEXOL  350 MG/ML SOLN COMPARISON:  Chest x-ray 02/08/2024, chest CT 09/10/2019 FINDINGS: Cardiovascular: Satisfactory opacification of the pulmonary arteries to the segmental level. No evidence of pulmonary embolism. Advanced aortic atherosclerosis. No aneurysm or dissection. Multi-vessel coronary vascular calcification. Cardiomegaly. No pericardial effusion. Mediastinum/Nodes: Patent trachea. No thyroid  mass. AP window lymph nodes measuring up to 10 mm. Small right hilar nodes measuring up to 16 mm. Moderate hiatal hernia. Lungs/Pleura: Mild  diffuse subpleural reticulation. Mild diffuse septal thickening. Small left and small moderate right pleural effusions. Passive atelectasis in the lower lobes. Stable bilateral pulmonary nodules compared to prior, largest is in the left lower lobe and measures 7 mm, series 5, image 80, previously 7 mm. 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 Review of the MIP images confirms the above findings. IMPRESSION: 1. Negative for acute pulmonary embolus. 2. Cardiomegaly with small left and small moderate right pleural effusions. Mild diffuse septal thickening suggesting interstitial edema. 3. Stable bilateral pulmonary nodules measuring up to 7 mm. These are stable since 2021, no further follow-up imaging recommended 4. 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 5. Aortic atherosclerosis. Aortic Atherosclerosis (ICD10-I70.0). Electronically Signed   By: Luke Bun M.D.   On: 02/08/2024 20:50   US  Venous Img Lower  Left (DVT Study) Result Date: 02/08/2024 EXAM: ULTRASOUND DUPLEX OF THE LEFT LOWER EXTREMITY VEINS TECHNIQUE: Duplex ultrasound using B-mode/gray scaled imaging and Doppler spectral analysis and color flow was obtained of the deep venous structures of the left lower extremity. COMPARISON: None available. CLINICAL HISTORY: L swelling FINDINGS: The common femoral vein, femoral vein, popliteal vein, and posterior tibial vein demonstrate normal compressibility with normal color flow and spectral analysis. IMPRESSION: 1. No evidence of DVT. Electronically signed by: Norman Gatlin MD 02/08/2024 08:38 PM EST RP Workstation: [id number]   DG Chest 2 View Result Date: 02/08/2024 CLINICAL DATA:  Chest pain and shortness of breath. EXAM: DG CHEST 2V COMPARISON:  None  Available. FINDINGS: Heart is enlarged. Aortic atherosclerosis. Retrocardiac hiatal hernia. Diffuse bronchial thickening. No focal airspace disease. No pneumothorax or pleural effusion. Left shoulder arthropathy. IMPRESSION: 1. Cardiomegaly. 2. Diffuse bronchial thickening. 3. Hiatal hernia. Electronically Signed   By: Andrea Gasman M.D.   On: 02/08/2024 18:57         "

## 2024-02-20 NOTE — Assessment & Plan Note (Signed)
 Encourage oral hydration and avoid nephrotoxins.  Check multiple myeloma panel, maturation

## 2024-02-20 NOTE — Assessment & Plan Note (Signed)
 Lab Results  Component Value Date   HGB 9.9 (L) 02/14/2024   TIBC 437 02/09/2024   IRONPCTSAT 4 (L) 02/09/2024   FERRITIN 26 02/09/2024    Recommend IV Venofer weekly x 3 to further improve iron stores follow-up both iron deficient anemia and anemia due to chronic kidney disease.  I discussed about the potential risks including but not limited to allergic reactions/infusion reactions including anaphylactic reactions, diarrhea, phlebitis, high blood pressure, wheezing, SOB, skin rash, weight gain,dark urine, leg swelling, back pain, headache, nausea and fatigue, etc. Patient agrees with infusion treatments.  Etiology of iron deficient anemia.  Suspect GI blood loss.  Patient declined GI workup.  Last colonoscopy was in 2021.

## 2024-02-20 NOTE — Assessment & Plan Note (Signed)
 See above. IV Venofer treatments to further improve iron stores.  If hemoglobin is persistently less than 10 despite improved oral iron supplementation, consider erythropoietin replacement therapy.

## 2024-02-21 ENCOUNTER — Encounter: Payer: Self-pay | Admitting: Oncology

## 2024-02-21 NOTE — Addendum Note (Signed)
 Addended by: BABARA CALL on: 02/21/2024 01:18 PM   Modules accepted: Orders

## 2024-02-22 ENCOUNTER — Inpatient Hospital Stay

## 2024-02-22 VITALS — BP 141/58 | HR 53 | Temp 98.0°F | Resp 18

## 2024-02-22 DIAGNOSIS — D631 Anemia in chronic kidney disease: Secondary | ICD-10-CM

## 2024-02-22 DIAGNOSIS — D509 Iron deficiency anemia, unspecified: Secondary | ICD-10-CM

## 2024-02-22 MED ORDER — SODIUM CHLORIDE 0.9% FLUSH
10.0000 mL | Freq: Once | INTRAVENOUS | Status: AC | PRN
  Administered 2024-02-22: 10 mL
  Filled 2024-02-22: qty 10

## 2024-02-22 MED ORDER — IRON SUCROSE 20 MG/ML IV SOLN
200.0000 mg | Freq: Once | INTRAVENOUS | Status: AC
  Administered 2024-02-22: 200 mg via INTRAVENOUS
  Filled 2024-02-22: qty 10

## 2024-02-29 ENCOUNTER — Inpatient Hospital Stay

## 2024-02-29 VITALS — BP 129/62 | HR 52 | Temp 98.7°F | Resp 18

## 2024-02-29 DIAGNOSIS — D509 Iron deficiency anemia, unspecified: Secondary | ICD-10-CM

## 2024-02-29 DIAGNOSIS — D631 Anemia in chronic kidney disease: Secondary | ICD-10-CM

## 2024-02-29 MED ORDER — IRON SUCROSE 20 MG/ML IV SOLN
200.0000 mg | Freq: Once | INTRAVENOUS | Status: AC
  Administered 2024-02-29: 200 mg via INTRAVENOUS
  Filled 2024-02-29: qty 10

## 2024-02-29 MED ORDER — SODIUM CHLORIDE 0.9% FLUSH
10.0000 mL | Freq: Once | INTRAVENOUS | Status: AC | PRN
  Administered 2024-02-29: 10 mL
  Filled 2024-02-29: qty 10

## 2024-03-07 ENCOUNTER — Inpatient Hospital Stay

## 2024-03-07 VITALS — BP 141/51 | HR 55 | Temp 98.2°F

## 2024-03-07 DIAGNOSIS — D509 Iron deficiency anemia, unspecified: Secondary | ICD-10-CM | POA: Diagnosis not present

## 2024-03-07 DIAGNOSIS — D631 Anemia in chronic kidney disease: Secondary | ICD-10-CM

## 2024-03-07 MED ORDER — IRON SUCROSE 20 MG/ML IV SOLN
200.0000 mg | Freq: Once | INTRAVENOUS | Status: AC
  Administered 2024-03-07: 200 mg via INTRAVENOUS
  Filled 2024-03-07: qty 10

## 2024-03-07 NOTE — Patient Instructions (Signed)

## 2024-03-14 ENCOUNTER — Inpatient Hospital Stay

## 2024-03-14 VITALS — BP 138/63 | HR 59 | Temp 98.6°F | Resp 18

## 2024-03-14 DIAGNOSIS — D509 Iron deficiency anemia, unspecified: Secondary | ICD-10-CM

## 2024-03-14 DIAGNOSIS — D631 Anemia in chronic kidney disease: Secondary | ICD-10-CM

## 2024-03-14 MED ORDER — IRON SUCROSE 20 MG/ML IV SOLN
200.0000 mg | Freq: Once | INTRAVENOUS | Status: AC
  Administered 2024-03-14: 200 mg via INTRAVENOUS

## 2024-03-14 NOTE — Patient Instructions (Signed)

## 2024-05-01 ENCOUNTER — Inpatient Hospital Stay

## 2024-05-06 ENCOUNTER — Inpatient Hospital Stay: Admitting: Oncology

## 2024-05-06 ENCOUNTER — Inpatient Hospital Stay
